# Patient Record
Sex: Male | Born: 1965 | Race: White | Hispanic: No | Smoking: Former smoker
Health system: Southern US, Community
[De-identification: ages and names within clinical notes are randomized; demographics above are authoritative.]

## PROBLEM LIST (undated history)

## (undated) DIAGNOSIS — K589 Irritable bowel syndrome without diarrhea: Secondary | ICD-10-CM

## (undated) DIAGNOSIS — I10 Essential (primary) hypertension: Secondary | ICD-10-CM

## (undated) DIAGNOSIS — E669 Obesity, unspecified: Secondary | ICD-10-CM

## (undated) DIAGNOSIS — K219 Gastro-esophageal reflux disease without esophagitis: Secondary | ICD-10-CM

## (undated) DIAGNOSIS — M1990 Unspecified osteoarthritis, unspecified site: Secondary | ICD-10-CM

## (undated) DIAGNOSIS — F329 Major depressive disorder, single episode, unspecified: Secondary | ICD-10-CM

## (undated) DIAGNOSIS — T7840XA Allergy, unspecified, initial encounter: Secondary | ICD-10-CM

## (undated) DIAGNOSIS — G9332 Myalgic encephalomyelitis/chronic fatigue syndrome: Secondary | ICD-10-CM

## (undated) DIAGNOSIS — R5382 Chronic fatigue, unspecified: Secondary | ICD-10-CM

## (undated) DIAGNOSIS — N200 Calculus of kidney: Secondary | ICD-10-CM

## (undated) DIAGNOSIS — E785 Hyperlipidemia, unspecified: Secondary | ICD-10-CM

## (undated) DIAGNOSIS — M797 Fibromyalgia: Secondary | ICD-10-CM

## (undated) DIAGNOSIS — F419 Anxiety disorder, unspecified: Secondary | ICD-10-CM

## (undated) DIAGNOSIS — F32A Depression, unspecified: Secondary | ICD-10-CM

## (undated) DIAGNOSIS — G4730 Sleep apnea, unspecified: Secondary | ICD-10-CM

## (undated) HISTORY — DX: Allergy, unspecified, initial encounter: T78.40XA

## (undated) HISTORY — DX: Myalgic encephalomyelitis/chronic fatigue syndrome: G93.32

## (undated) HISTORY — DX: Unspecified osteoarthritis, unspecified site: M19.90

## (undated) HISTORY — DX: Irritable bowel syndrome, unspecified: K58.9

## (undated) HISTORY — PX: KNEE SURGERY: SHX244

## (undated) HISTORY — DX: Chronic fatigue, unspecified: R53.82

## (undated) HISTORY — DX: Major depressive disorder, single episode, unspecified: F32.9

## (undated) HISTORY — DX: Hyperlipidemia, unspecified: E78.5

## (undated) HISTORY — DX: Fibromyalgia: M79.7

## (undated) HISTORY — DX: Sleep apnea, unspecified: G47.30

## (undated) HISTORY — DX: Essential (primary) hypertension: I10

## (undated) HISTORY — DX: Calculus of kidney: N20.0

## (undated) HISTORY — PX: SHOULDER SURGERY: SHX246

## (undated) HISTORY — DX: Anxiety disorder, unspecified: F41.9

## (undated) HISTORY — DX: Depression, unspecified: F32.A

## (undated) HISTORY — PX: MOUTH SURGERY: SHX715

## (undated) HISTORY — PX: BACK SURGERY: SHX140

## (undated) HISTORY — DX: Gastro-esophageal reflux disease without esophagitis: K21.9

## (undated) HISTORY — DX: Obesity, unspecified: E66.9

## (undated) HISTORY — PX: OTHER SURGICAL HISTORY: SHX169

## (undated) HISTORY — PX: CHOLECYSTECTOMY: SHX55

---

## 2002-08-21 ENCOUNTER — Emergency Department (HOSPITAL_COMMUNITY): Admission: EM | Admit: 2002-08-21 | Discharge: 2002-08-21 | Payer: Self-pay

## 2002-08-23 ENCOUNTER — Encounter: Admission: RE | Admit: 2002-08-23 | Discharge: 2002-08-23 | Payer: Self-pay | Admitting: *Deleted

## 2002-08-23 ENCOUNTER — Encounter: Payer: Self-pay | Admitting: Gastroenterology

## 2002-11-08 ENCOUNTER — Encounter: Payer: Self-pay | Admitting: Gastroenterology

## 2002-11-08 ENCOUNTER — Encounter: Admission: RE | Admit: 2002-11-08 | Discharge: 2002-11-08 | Payer: Self-pay | Admitting: Gastroenterology

## 2005-12-14 ENCOUNTER — Emergency Department (HOSPITAL_COMMUNITY): Admission: EM | Admit: 2005-12-14 | Discharge: 2005-12-14 | Payer: Self-pay | Admitting: Emergency Medicine

## 2005-12-30 ENCOUNTER — Encounter: Admission: RE | Admit: 2005-12-30 | Discharge: 2006-01-28 | Payer: Self-pay | Admitting: Family Medicine

## 2006-02-20 ENCOUNTER — Encounter: Admission: RE | Admit: 2006-02-20 | Discharge: 2006-02-20 | Payer: Self-pay | Admitting: Endocrinology

## 2006-04-03 ENCOUNTER — Encounter: Admission: RE | Admit: 2006-04-03 | Discharge: 2006-04-03 | Payer: Self-pay | Admitting: Orthopedic Surgery

## 2006-06-18 ENCOUNTER — Encounter (INDEPENDENT_AMBULATORY_CARE_PROVIDER_SITE_OTHER): Payer: Self-pay | Admitting: Specialist

## 2006-06-18 ENCOUNTER — Ambulatory Visit (HOSPITAL_BASED_OUTPATIENT_CLINIC_OR_DEPARTMENT_OTHER): Admission: RE | Admit: 2006-06-18 | Discharge: 2006-06-18 | Payer: Self-pay | Admitting: Orthopedic Surgery

## 2007-03-30 ENCOUNTER — Emergency Department (HOSPITAL_COMMUNITY): Admission: EM | Admit: 2007-03-30 | Discharge: 2007-03-31 | Payer: Self-pay | Admitting: Emergency Medicine

## 2009-04-19 ENCOUNTER — Emergency Department (HOSPITAL_COMMUNITY): Admission: EM | Admit: 2009-04-19 | Discharge: 2009-04-20 | Payer: Self-pay | Admitting: Emergency Medicine

## 2009-05-01 ENCOUNTER — Emergency Department (HOSPITAL_COMMUNITY): Admission: EM | Admit: 2009-05-01 | Discharge: 2009-05-01 | Payer: Self-pay | Admitting: Emergency Medicine

## 2009-05-02 ENCOUNTER — Ambulatory Visit (HOSPITAL_COMMUNITY): Admission: RE | Admit: 2009-05-02 | Discharge: 2009-05-02 | Payer: Self-pay | Admitting: Internal Medicine

## 2009-05-21 ENCOUNTER — Encounter: Payer: Self-pay | Admitting: Cardiovascular Disease

## 2009-10-07 ENCOUNTER — Emergency Department (HOSPITAL_COMMUNITY): Admission: EM | Admit: 2009-10-07 | Discharge: 2009-10-08 | Payer: Self-pay | Admitting: Emergency Medicine

## 2009-10-09 ENCOUNTER — Emergency Department (HOSPITAL_COMMUNITY): Admission: EM | Admit: 2009-10-09 | Discharge: 2009-10-09 | Payer: Self-pay | Admitting: Emergency Medicine

## 2009-10-09 ENCOUNTER — Ambulatory Visit (HOSPITAL_COMMUNITY): Admission: AD | Admit: 2009-10-09 | Discharge: 2009-10-09 | Payer: Self-pay | Admitting: Urology

## 2009-10-16 ENCOUNTER — Observation Stay (HOSPITAL_COMMUNITY): Admission: AD | Admit: 2009-10-16 | Discharge: 2009-10-17 | Payer: Self-pay | Admitting: Urology

## 2010-10-13 LAB — DIFFERENTIAL
Basophils Absolute: 0 10*3/uL (ref 0.0–0.1)
Eosinophils Absolute: 0.3 10*3/uL (ref 0.0–0.7)
Lymphs Abs: 2.1 10*3/uL (ref 0.7–4.0)
Neutrophils Relative %: 67 % (ref 43–77)

## 2010-10-13 LAB — POCT I-STAT, CHEM 8
BUN: 15 mg/dL (ref 6–23)
Chloride: 104 mEq/L (ref 96–112)
Creatinine, Ser: 1.1 mg/dL (ref 0.4–1.5)
Potassium: 3.8 mEq/L (ref 3.5–5.1)
Sodium: 139 mEq/L (ref 135–145)

## 2010-10-13 LAB — URINALYSIS, ROUTINE W REFLEX MICROSCOPIC
Bilirubin Urine: NEGATIVE
Glucose, UA: NEGATIVE mg/dL
Hgb urine dipstick: NEGATIVE
Protein, ur: NEGATIVE mg/dL
Specific Gravity, Urine: 1.023 (ref 1.005–1.030)
Urobilinogen, UA: 0.2 mg/dL (ref 0.0–1.0)

## 2010-10-13 LAB — BASIC METABOLIC PANEL
BUN: 20 mg/dL (ref 6–23)
Chloride: 102 mEq/L (ref 96–112)
Creatinine, Ser: 2.28 mg/dL — ABNORMAL HIGH (ref 0.4–1.5)
GFR calc non Af Amer: 32 mL/min — ABNORMAL LOW (ref >60)
Glucose, Bld: 110 mg/dL — ABNORMAL HIGH (ref 70–99)

## 2010-10-13 LAB — URINE CULTURE: Culture: NO GROWTH

## 2010-10-13 LAB — CBC
MCV: 86.5 fL (ref 78.0–100.0)
Platelets: 227 10*3/uL (ref 150–400)
RDW: 12.6 % (ref 11.5–15.5)
WBC: 9.4 10*3/uL (ref 4.0–10.5)

## 2010-10-25 LAB — POCT CARDIAC MARKERS
CKMB, poc: 1.9 ng/mL (ref 1.0–8.0)
Myoglobin, poc: 156 ng/mL (ref 12–200)

## 2010-10-25 LAB — CBC
MCV: 87.2 fL (ref 78.0–100.0)
Platelets: 332 10*3/uL (ref 150–400)
RBC: 5.15 MIL/uL (ref 4.22–5.81)
WBC: 9.9 10*3/uL (ref 4.0–10.5)

## 2010-10-25 LAB — DIFFERENTIAL
Lymphocytes Relative: 19 % (ref 12–46)
Lymphs Abs: 1.8 10*3/uL (ref 0.7–4.0)
Monocytes Relative: 5 % (ref 3–12)
Neutro Abs: 7.5 10*3/uL (ref 1.7–7.7)
Neutrophils Relative %: 76 % (ref 43–77)

## 2010-10-25 LAB — BRAIN NATRIURETIC PEPTIDE: Pro B Natriuretic peptide (BNP): <30 pg/mL (ref 0.0–100.0)

## 2010-10-25 LAB — APTT: aPTT: 25 seconds (ref 24–37)

## 2010-10-25 LAB — BASIC METABOLIC PANEL
BUN: 10 mg/dL (ref 6–23)
Calcium: 9.1 mg/dL (ref 8.4–10.5)
Chloride: 104 mEq/L (ref 96–112)
Creatinine, Ser: 1.07 mg/dL (ref 0.4–1.5)
GFR calc Af Amer: >60 mL/min (ref >60)
GFR calc non Af Amer: >60 mL/min (ref >60)

## 2010-10-25 LAB — PROTIME-INR
INR: 1 (ref 0.00–1.49)
Prothrombin Time: 12.9 seconds (ref 11.6–15.2)

## 2010-11-06 IMAGING — CT CT ANGIO CHEST
2 of 6 series · 19 of 36 positions shown · IV contrast (APPLIED)
Comparison: Chest radiograph from the same day.

CLINICAL DATA: 43-year-old male with shortness of breath, cough,
chest tightness.

CT ANGIOGRAPHY CHEST WITH CONTRAST
TECHNIQUE: Multidetector CT imaging of the chest was performed
using the standard protocol during bolus administration of
intravenous contrast. Multiplanar CT image reconstructions
including MIPs were obtained to evaluate the vascular anatomy.
Contrast: 100 ml [id number].

[Series 8: pulm embolism 1.0 b25f thins · axial · 0.71mm/px · z∈[+1284,+1484]mm · 17 of 224 slices shown]
[im 12/224  lung]
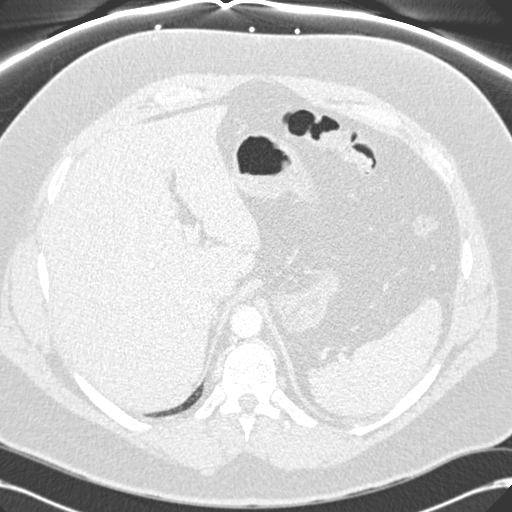
[im 23/224  mediastinal]
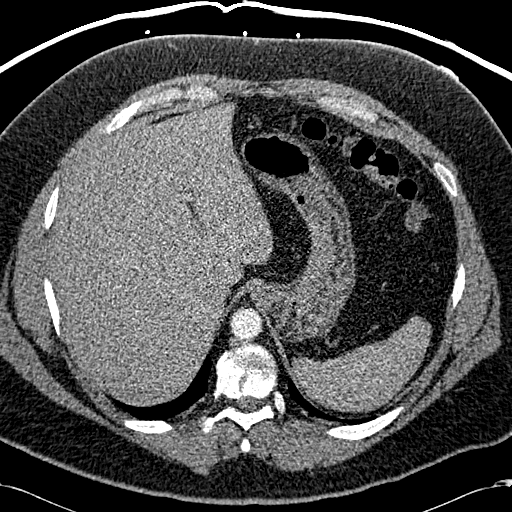
[im 34/224  lung]
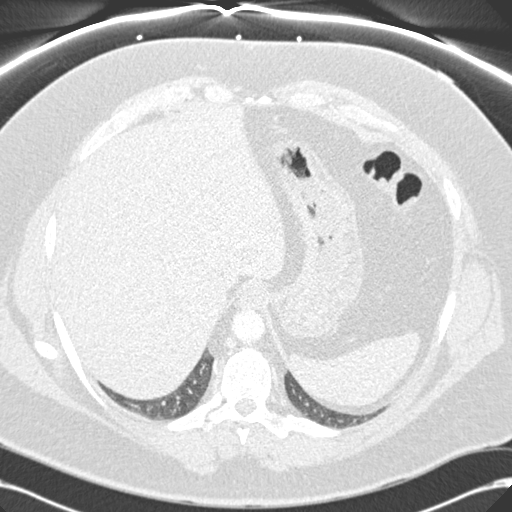
[im 45/224  mediastinal]
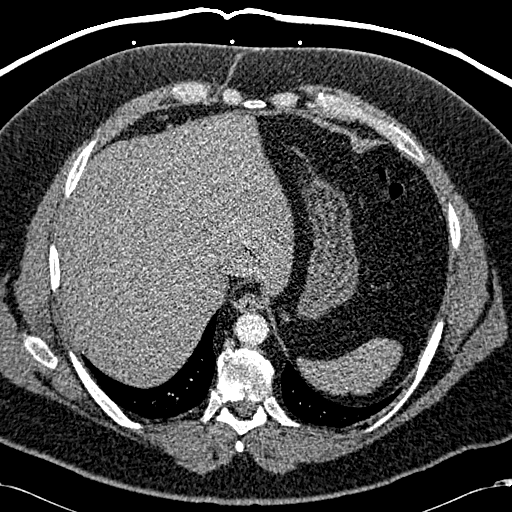
[im 67/224  lung]
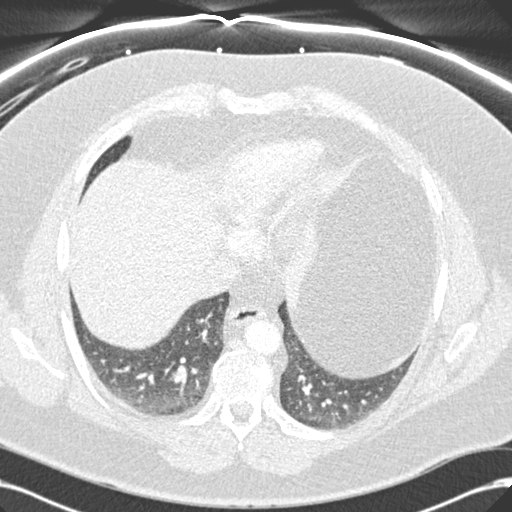
[im 79/224  mediastinal]
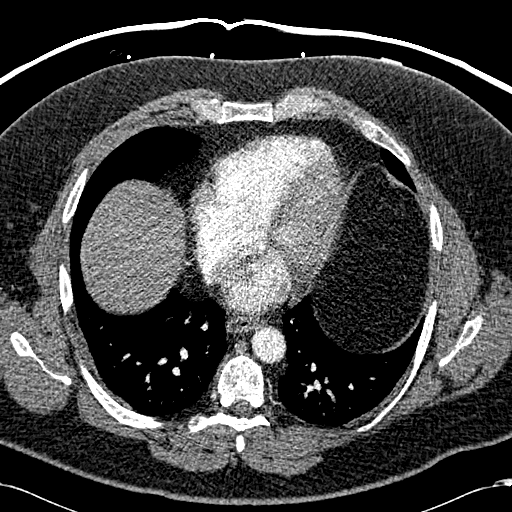
[im 90/224  lung]
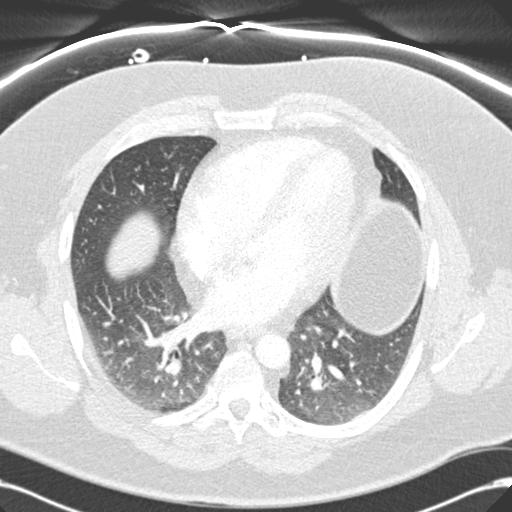
[im 101/224  mediastinal]
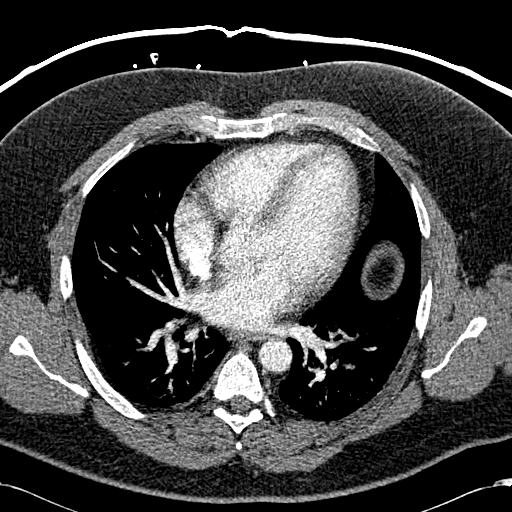
[im 112/224  lung]
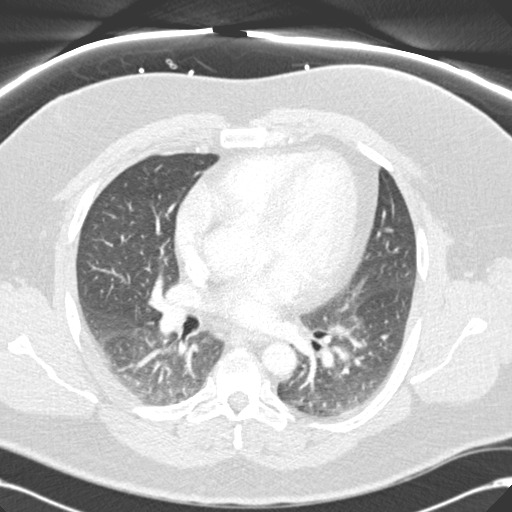
[im 123/224  mediastinal]
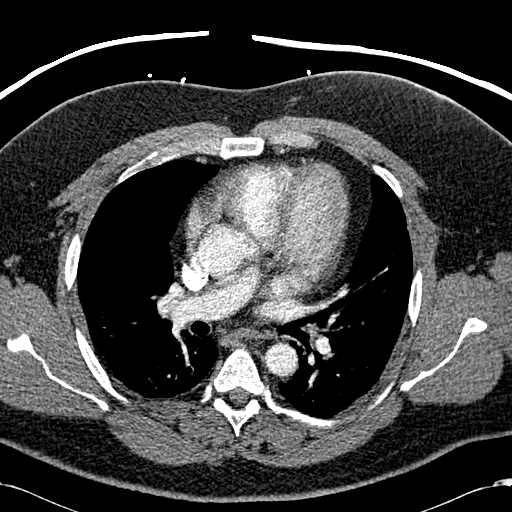
[im 134/224  lung]
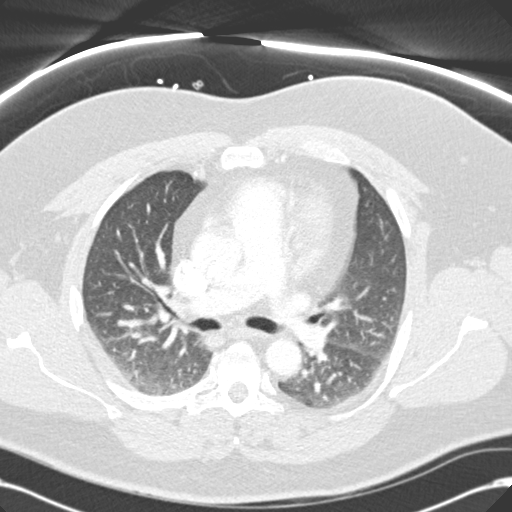
[im 145/224  mediastinal]
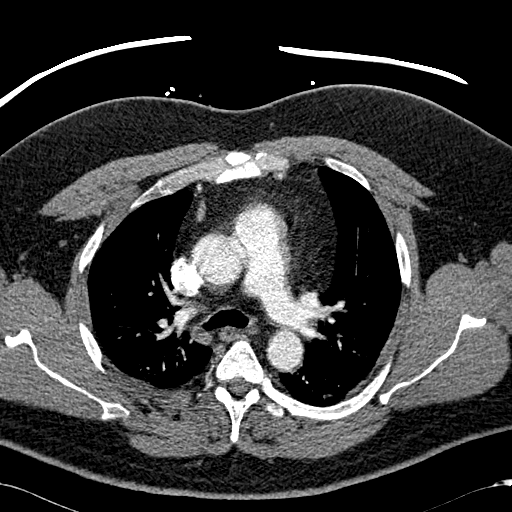
[im 157/224  lung]
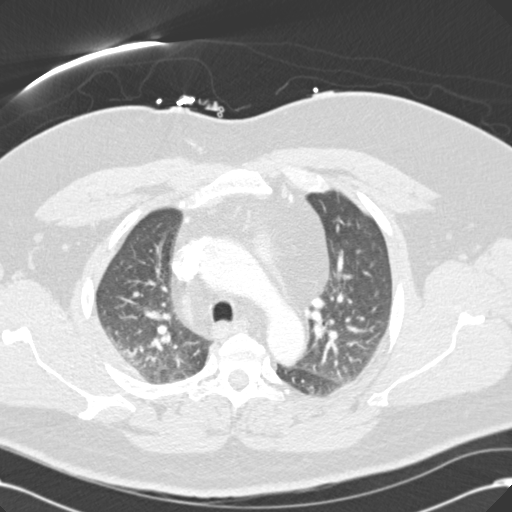
[im 179/224  mediastinal]
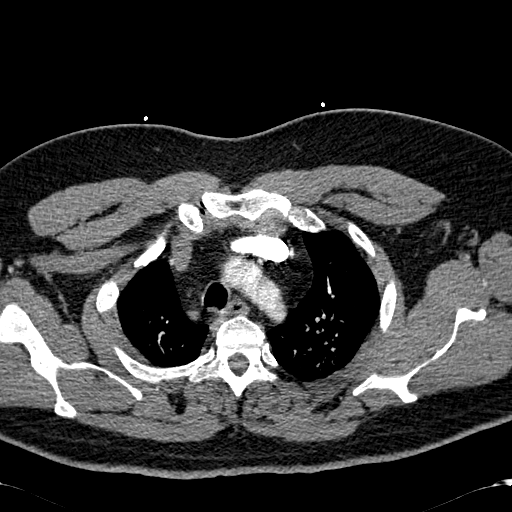
[im 190/224  lung]
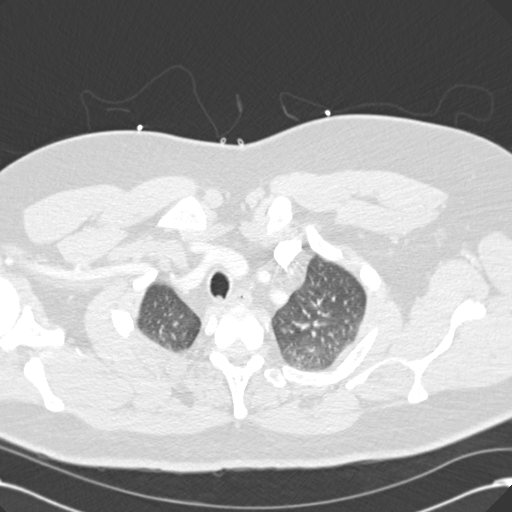
[im 201/224  mediastinal]
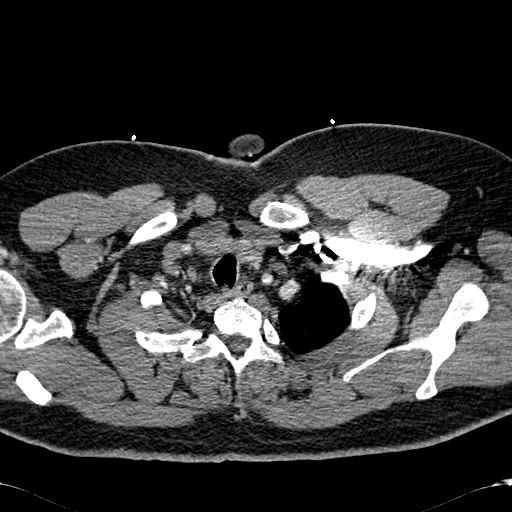
[im 212/224  lung]
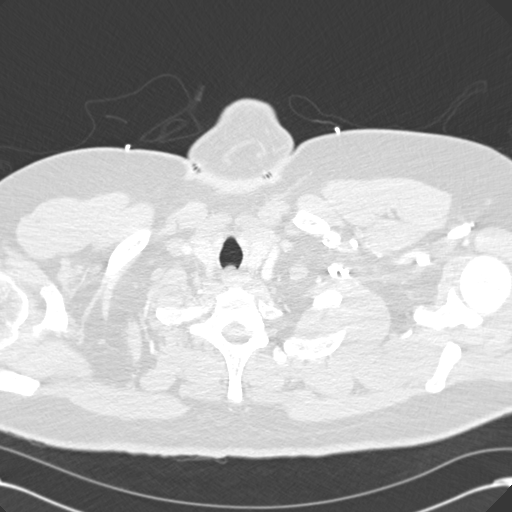

[Series 604: coronal mips · coronal · 0.71mm/px · 2 of 127 slices shown]
[im 43/127  mediastinal]
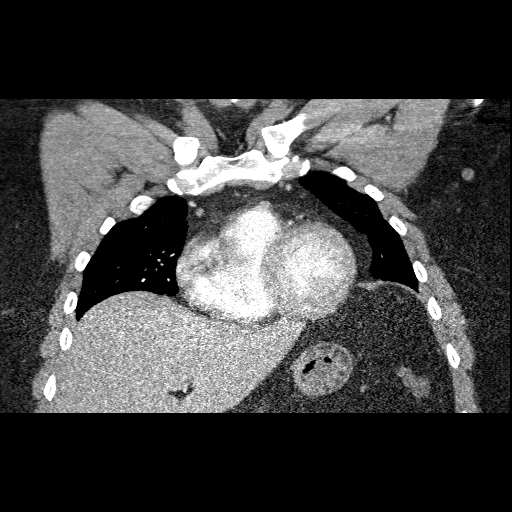
[im 85/127  mediastinal]
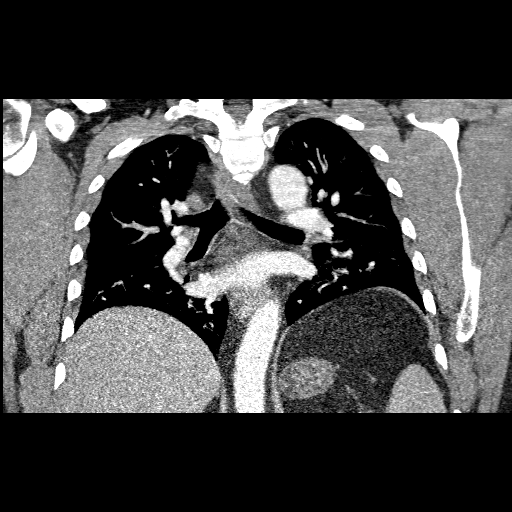

[19 of 36 positions shown; findings below may reference images not displayed]

FINDINGS: Suboptimal contrast bolus timing in the pulmonary
arterial tree.  Superimposed intermittent mild respiratory motion
artifact.  No central pulmonary embolus.  No lobar or segmental
filling defects in the pulmonary arteries.  Subsegmental branches,
particularly at the lung bases, are not well evaluated.

Dependent ground-glass opacity, likely atelectasis bilaterally.  No
airspace consolidation or confluent airspace disease.  Major airway
is are patent.

Mediastinal lipomatosis.  No pericardial or pleural effusion.  No
lymphadenopathy.  Visualized aorta unremarkable.  Visualized
thoracic inlet and upper abdominal viscera are within normal
limits.

No acute osseous abnormality identified.

Review of the MIP images confirms the above findings.
IMPRESSION: 1.  Suboptimal contrast bolus timing, and suboptimal evaluation of
subsegmental branches.  Otherwise, no evidence of acute pulmonary
embolus.
2.  Low lung volumes with atelectasis.  No other acute findings
identified in the chest.

## 2010-12-06 NOTE — Op Note (Signed)
Timothy Solis, Timothy Solis               ACCOUNT NO.:  [id number]   MEDICAL RECORD NO.:  [id number]          PATIENT TYPE:  AMB   LOCATION:  [facility]                         FACILITY:  [facility]   PHYSICIAN:  Deidre Ala, M.D.    DATE OF BIRTH:  02-Dec-1965   DATE OF PROCEDURE:  06/18/2006  DATE OF DISCHARGE:                               OPERATIVE REPORT   PREOPERATIVE DIAGNOSIS:  Left hip greater trochanteric bursitis with  tight iliotibial band and prominent greater trochanter.   POSTOPERATIVE DIAGNOSIS:  Left hip greater trochanteric bursitis with  tight iliotibial band and prominent greater trochanter.   PROCEDURE:  1. Left hip partial greater trochanteric ostectomy.  2. Iliotibial band release.  3. Greater trochanteric bursectomy left hip.   SURGEON:  1. Charlesetta Shanks, M.D.   ASSISTANT:  Clarene Reamer, [facility].   ANESTHESIA:  General endotracheal.   CULTURES:  None.   DRAINS:  None.   BLOOD LOSS:  Less than 100 mL.   REPLACEMENT:  Without.   PATHOLOGIC FINDINGS AND HISTORY:  Timothy Solis has been followed by Dr.  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  He is not felt,  at this point, he has a good candidate for surgical intervention but he  has had persistent hip area pain exquisitely tender.  He was injected  with cortisone and Marcaine visit to Dr. Alveda Reasons and came back with severe  pain.  Dr. Alveda Reasons referred him to me for further consultation.  This pain  had been going on for 2 months, getting worse.  The last shot helped for  about 5 days then recurred.  The pain from the shot was excruciating.  He is miserable.  He was ready to have something done.  His x-ray showed  some prominence of the greater trochanter but otherwise no other issues.  He had a marked left antalgic gait, no groin pain, markedly tender over  the left hip trochanteric bursa.  At surgery, he had a very, very tight  iliotibial band with a thickened trochanteric bursa sac measuring about  3 x 3 cm.   There was a very, very sharp number of osteophytes and  spurring underneath the bursa along the edge of the trochanter that we  smoothed off as well as removing the bursal sac and doing the iliotibial  band release.   PROCEDURE:  With adequate anesthesia obtained using endotracheal  technique, 1 gram Ancef given IV prophylaxis, the patient was placed in  the right lateral decubitus position with the left side up with the  beanbag with appropriate padding.  A standard prepping and draping was  carried out over the left hip.  Incision was then made directly over the  trochanter approximately 3-1/2 to 4 inches in length.  Incision deepened  sharply with a knife and hemostasis obtained using the Bovie  electrocoagulator.  Dissection was carried down to the iliotibial band  and into the tensor fascia lata and a longitudinal incision was made in  the iliotibial band directly over the trochanter with a anterior  posterior incision to further relax it about 1  cm right over the point  of the greater trochanter.  I then sharply dissected out and off the  periosteum of the bursal sac and sent it for specimen.  I then used an  osteotome and rongeur to smooth the undersurface of the bone doing a  partial greater trochanteric ostectomy.  A light film of bone wax was  placed for hemostasis.  Thorough irrigation was carried out with triple  antibiotic solution.  The wound was then closed in layers on the deep  subcu with 0 and then 2-0 and 3-0 Vicryl and skin staples.  Hemostasis  was further obtained.  Bulky sterile compressive dressing was applied  and the patient, having tolerated the procedure well, was awakened,  taken to the recovery room in satisfactory condition, to be discharged  per outpatient routine, crutches, weightbearing as tolerated, ice packs  to the area with Percocet for pain.  Told to call the office for  appointment for recheck on Monday.            ______________________________  V. Charlesetta Shanks, M.D.     VEP/MEDQ  D:  06/18/2006  T:  06/19/2006  Job:  [id number]   cc:   Otho Darner, M.D.  Fax: [phone]   Nelda Severe, MD  Fax: [phone]   Anna Genre. Little, M.D.  Fax: [phone]

## 2011-03-20 ENCOUNTER — Inpatient Hospital Stay: Admission: RE | Admit: 2011-03-20 | Payer: Self-pay | Source: Ambulatory Visit

## 2011-03-20 ENCOUNTER — Other Ambulatory Visit: Payer: Self-pay | Admitting: Family Medicine

## 2011-03-21 ENCOUNTER — Ambulatory Visit
Admission: RE | Admit: 2011-03-21 | Discharge: 2011-03-21 | Disposition: A | Discharge status: Home / Self Care | Payer: PRIVATE HEALTH INSURANCE | Source: Ambulatory Visit | Attending: Family Medicine | Admitting: Family Medicine

## 2011-03-21 ENCOUNTER — Other Ambulatory Visit: Payer: Self-pay

## 2011-03-27 ENCOUNTER — Encounter: Payer: Self-pay | Admitting: Internal Medicine

## 2011-04-15 ENCOUNTER — Ambulatory Visit: Payer: PRIVATE HEALTH INSURANCE | Admitting: Internal Medicine

## 2011-05-02 LAB — URINALYSIS, ROUTINE W REFLEX MICROSCOPIC
Glucose, UA: NEGATIVE
Ketones, ur: NEGATIVE
Leukocytes, UA: NEGATIVE
Nitrite: NEGATIVE
Protein, ur: NEGATIVE
Urobilinogen, UA: 0.2

## 2011-05-02 LAB — CBC
HCT: 45.2
Hemoglobin: 15.4
MCHC: 34.1
MCV: 86.6
RBC: 5.23
RDW: 13

## 2011-05-02 LAB — I-STAT 8, (EC8 V) (CONVERTED LAB)
Acid-Base Excess: 1
BUN: 13
Bicarbonate: 23.6
Chloride: 104
HCT: 48
Hemoglobin: 16.3
Operator id: 272551
Sodium: 137

## 2011-05-02 LAB — DIFFERENTIAL
Basophils Absolute: 0
Lymphocytes Relative: 21
Monocytes Absolute: 0.5
Monocytes Relative: 6
Neutro Abs: 6.4

## 2011-05-02 LAB — URINE MICROSCOPIC-ADD ON

## 2011-07-02 ENCOUNTER — Ambulatory Visit (INDEPENDENT_AMBULATORY_CARE_PROVIDER_SITE_OTHER): Payer: PRIVATE HEALTH INSURANCE

## 2011-07-02 DIAGNOSIS — J209 Acute bronchitis, unspecified: Secondary | ICD-10-CM

## 2011-07-23 ENCOUNTER — Ambulatory Visit (INDEPENDENT_AMBULATORY_CARE_PROVIDER_SITE_OTHER): Payer: PRIVATE HEALTH INSURANCE

## 2011-07-23 DIAGNOSIS — J40 Bronchitis, not specified as acute or chronic: Secondary | ICD-10-CM

## 2011-07-23 DIAGNOSIS — J069 Acute upper respiratory infection, unspecified: Secondary | ICD-10-CM

## 2011-07-23 DIAGNOSIS — J040 Acute laryngitis: Secondary | ICD-10-CM

## 2011-08-05 ENCOUNTER — Ambulatory Visit (INDEPENDENT_AMBULATORY_CARE_PROVIDER_SITE_OTHER): Payer: PRIVATE HEALTH INSURANCE

## 2011-08-05 DIAGNOSIS — J029 Acute pharyngitis, unspecified: Secondary | ICD-10-CM

## 2011-09-09 ENCOUNTER — Ambulatory Visit: Payer: PRIVATE HEALTH INSURANCE

## 2011-09-10 ENCOUNTER — Ambulatory Visit (INDEPENDENT_AMBULATORY_CARE_PROVIDER_SITE_OTHER): Payer: PRIVATE HEALTH INSURANCE | Admitting: Family Medicine

## 2011-09-10 VITALS — BP 134/80 | HR 96 | Temp 98.6°F | Resp 22 | Ht 70.5 in | Wt 327.4 lb

## 2011-09-10 DIAGNOSIS — E669 Obesity, unspecified: Secondary | ICD-10-CM

## 2011-09-10 DIAGNOSIS — R110 Nausea: Secondary | ICD-10-CM

## 2011-09-10 DIAGNOSIS — R51 Headache: Secondary | ICD-10-CM

## 2011-09-10 DIAGNOSIS — B9789 Other viral agents as the cause of diseases classified elsewhere: Secondary | ICD-10-CM

## 2011-09-10 DIAGNOSIS — I10 Essential (primary) hypertension: Secondary | ICD-10-CM

## 2011-09-10 DIAGNOSIS — J310 Chronic rhinitis: Secondary | ICD-10-CM

## 2011-09-10 DIAGNOSIS — B349 Viral infection, unspecified: Secondary | ICD-10-CM

## 2011-09-10 DIAGNOSIS — R5381 Other malaise: Secondary | ICD-10-CM

## 2011-09-10 LAB — COMPREHENSIVE METABOLIC PANEL
ALT: 30 U/L (ref 0–53)
AST: 28 U/L (ref 0–37)
Albumin: 4.4 g/dL (ref 3.5–5.2)
CO2: 23 mEq/L (ref 19–32)
Calcium: 9.3 mg/dL (ref 8.4–10.5)
Chloride: 105 mEq/L (ref 96–112)
Creat: 0.95 mg/dL (ref 0.50–1.35)
Potassium: 4.4 mEq/L (ref 3.5–5.3)
Sodium: 139 mEq/L (ref 135–145)
Total Protein: 6.8 g/dL (ref 6.0–8.3)

## 2011-09-10 LAB — POCT CBC
HCT, POC: 46.3 % (ref 43.5–53.7)
Hemoglobin: 14.9 g/dL (ref 14.1–18.1)
Lymph, poc: 2.3 (ref 0.6–3.4)
MCHC: 32.2 g/dL (ref 31.8–35.4)
POC Granulocyte: 4.5 (ref 2–6.9)
RBC: 5.32 M/uL (ref 4.69–6.13)

## 2011-09-10 LAB — POCT GLYCOSYLATED HEMOGLOBIN (HGB A1C): Hemoglobin A1C: 5.5

## 2011-09-10 LAB — GLUCOSE, POCT (MANUAL RESULT ENTRY): POC Glucose: 93

## 2011-09-10 MED ORDER — FLUTICASONE PROPIONATE 50 MCG/ACT NA SUSP: 2.0000 | Freq: Every day | NASAL | Status: DC

## 2011-09-10 NOTE — Patient Instructions (Signed)
Encourage plenty of fluids and get enough rest. If not any better get back to [place].

## 2011-09-10 NOTE — Progress Notes (Signed)
  Subjective:    Patient ID: Timothy Solis, male    DOB: 12-28-1965, 46 y.o.   MRN: [id number]  HPI He has been feeling bad since about Friday. He has had a headache, which may be doing a little bit better. However it does make it hard for him to focus on the computer screen and 3-D males and things. He also has nausea which has come and gone in waves. He had a lot of nausea this morning earlier but it has subsided some now. He just doesn't feel good. A lot is just a nonspecific malaise. No acute infection associated with it. He does have chronic sinus congestion from his allergies.  I had sent the patient to the ENT. It was a muscle in the neck hurting him. He switched him to prilosec and Allegra and he is doing better.    Review of Systems otherwise fairly unremarkable. No major cardiovascular GI or respiratory complaints other than the nausea.     Objective:   Physical Exam Obese white male no acute distress but just has the appearance of someone who does not feel good. His TMs are normal. A little bit of cerumen bilaterally. Eyes PERRLA fundi benign. Throat clear. Neck supple without significant nodes. No carotid bruits. Chest clear to auscultation. Heart regular without any murmurs gallops arrhythmias. Abdomen soft.  Results for orders placed in visit on 09/10/11  POCT CBC      Component Value Range   WBC 7.5  4.6 - 10.2 (K/uL)   Lymph, poc 2.3  0.6 - 3.4    POC LYMPH PERCENT 30.2  10 - 50 (%L)   MID (cbc) 0.7  0 - 0.9    POC MID % 9.9  0 - 12 (%M)   POC Granulocyte 4.5  2 - 6.9    Granulocyte percent 59.9  37 - 80 (%G)   RBC 5.32  4.69 - 6.13 (M/uL)   Hemoglobin 14.9  14.1 - 18.1 (g/dL)   HCT, POC 78.2  95.6 - 53.7 (%)   MCV 87.0  80 - 97 (fL)   MCH, POC 28.0  27 - 31.2 (pg)   MCHC 32.2  31.8 - 35.4 (g/dL)   RDW, POC 21.3     Platelet Count, POC 290  142 - 424 (K/uL)   MPV 7.6  0 - 99.8 (fL)  POCT GLYCOSYLATED HEMOGLOBIN (HGB A1C)      Component Value Range   Hemoglobin  A1C 5.5    GLUCOSE, POCT (MANUAL RESULT ENTRY)      Component Value Range   POC Glucose 93          Assessment & Plan:  Generalized malaise with headache and nausea.  We'll check labs and talks more.

## 2011-12-13 ENCOUNTER — Other Ambulatory Visit: Payer: Self-pay | Admitting: Family Medicine

## 2011-12-23 ENCOUNTER — Ambulatory Visit (INDEPENDENT_AMBULATORY_CARE_PROVIDER_SITE_OTHER): Payer: PRIVATE HEALTH INSURANCE | Admitting: Family Medicine

## 2011-12-23 VITALS — BP 130/84 | HR 98 | Temp 98.6°F | Resp 18 | Ht 70.5 in | Wt 319.0 lb

## 2011-12-23 DIAGNOSIS — F39 Unspecified mood [affective] disorder: Secondary | ICD-10-CM

## 2011-12-23 DIAGNOSIS — F411 Generalized anxiety disorder: Secondary | ICD-10-CM

## 2011-12-23 MED ORDER — LORAZEPAM 1 MG PO TABS: 1.0000 mg | ORAL_TABLET | Freq: Every day | ORAL | Status: DC

## 2011-12-23 NOTE — Progress Notes (Signed)
  Subjective:    Patient ID: Timothy Solis, male    DOB: 1966-02-25, 46 y.o.   MRN: [id number]  HPI  Patient presents to clinic detailing a several month history of depression.  Diagnosed with depression in his 20's; attempted suicide X 3 in the past(age 62, 21 and 25) History of alcoholism however once depression treated he stopped drinking.  Never entered treatment facility.  Fatigue, malaise, social isolation and anhedonia.  Denies active or passive suicidality.  FH/ Mood disorder  Stressors- "second mom" passed away in 09/12/2022                    Divorced;   Strong support system to include close friends and very supportive boss. Review of Systems     Objective:   Physical Exam  Constitutional: He appears well-developed.  Neck: Neck supple. No thyromegaly present.  Cardiovascular: Normal rate, regular rhythm and normal heart sounds.   Pulmonary/Chest: Effort normal and breath sounds normal.  Skin: Skin is warm.  Psychiatric:       Flat however interactive and insightful during interview        Assessment & Plan:   1. Mood disorder  LORazepam (ATIVAN) 1 MG tablet    Supportive counseling Patient to follow up with Dr. Dub Solis or Timothy Solis.  At close of OV patient again denies active or passive suicidality. Extended OV

## 2011-12-23 NOTE — Patient Instructions (Signed)
Please schedule appointment with   1) Dr. Dub Mikes   or  2) Dr. Tomasa Rand

## 2011-12-24 ENCOUNTER — Telehealth: Payer: Self-pay

## 2011-12-24 NOTE — Telephone Encounter (Addendum)
Pt states taking the LORazepam (ATIVAN) 1 MG tablet Is making him agitated and anxious Would like to discuss this with his doctor.  Called back has not heard from anyone.  Please call ASAP.

## 2011-12-24 NOTE — Telephone Encounter (Signed)
Patient developed panic symptoms earlier today that lasted 10 -15 minutes.  Symptoms have since resolved and patient feels back to baseline.    Patient has been on Ativan in the past and started back last night without difficulty. Given the discomfort of panic symptoms I encouraged patient to take .5 mg of Ativan in the morning and 1 mg of Ativan in the evening.  Patient has an appointment with Dr. Dub Mikes Tuesday 6/11.   Patient to call back with any problems prior to appointment with psychiatry.

## 2012-03-10 ENCOUNTER — Ambulatory Visit (INDEPENDENT_AMBULATORY_CARE_PROVIDER_SITE_OTHER): Payer: PRIVATE HEALTH INSURANCE | Admitting: Family Medicine

## 2012-03-10 ENCOUNTER — Ambulatory Visit: Payer: PRIVATE HEALTH INSURANCE

## 2012-03-10 VITALS — BP 121/81 | HR 96 | Temp 99.2°F | Resp 18 | Ht 70.5 in | Wt 312.0 lb

## 2012-03-10 DIAGNOSIS — J45909 Unspecified asthma, uncomplicated: Secondary | ICD-10-CM

## 2012-03-10 DIAGNOSIS — J45901 Unspecified asthma with (acute) exacerbation: Secondary | ICD-10-CM | POA: Insufficient documentation

## 2012-03-10 MED ORDER — PREDNISONE 50 MG PO TABS: ORAL_TABLET | ORAL | Status: AC

## 2012-03-10 MED ORDER — MONTELUKAST SODIUM 10 MG PO TABS: 10.0000 mg | ORAL_TABLET | Freq: Every day | ORAL | Status: DC

## 2012-03-10 MED ORDER — IPRATROPIUM BROMIDE 0.02 % IN SOLN
0.5000 mg | Freq: Once | RESPIRATORY_TRACT | Status: AC
  Administered 2012-03-10: 0.5 mg via RESPIRATORY_TRACT

## 2012-03-10 MED ORDER — ALBUTEROL SULFATE (2.5 MG/3ML) 0.083% IN NEBU
2.5000 mg | INHALATION_SOLUTION | Freq: Once | RESPIRATORY_TRACT | Status: AC
  Administered 2012-03-10: 2.5 mg via RESPIRATORY_TRACT

## 2012-03-10 MED ORDER — BECLOMETHASONE DIPROPIONATE 80 MCG/ACT IN AERS: 1.0000 | INHALATION_SPRAY | Freq: Two times a day (BID) | RESPIRATORY_TRACT | Status: DC

## 2012-03-10 NOTE — Assessment & Plan Note (Signed)
Patient does have what appears to be asthma exacerbation. Patient has been treated twice for pneumonia, and on radiological imaging we do not see one today. I will start with prednisone. Patient did have a past medical history significant for an allergic reaction last week to get which include muscle aches and pains. I do not feel that this is a true allergy as patient is taking in the past without any problems. Patient is going to 50 mg daily for the next 5 days. In addition this patient is given Qvar that he couldn't take at 80 mcg 2 times daily. Patient also start Singulair and take this daily at bedtime. Looking back to patient's previous history patient has had significant exacerbation this time year and I do think allergies are contributing to his problem. Patient will followup in one week time for reevaluation. Patient states he did not need a note for work today.

## 2012-03-10 NOTE — Progress Notes (Signed)
  Subjective:    Patient ID: Timothy Solis, male    DOB: 04-15-1966, 46 y.o.   MRN: [id number]  HPI 46 year old male who has had about one month history of shortness of breath, cough as well as fevers chills and fatigue. Patient has been seen multiple times over the course of last month. Patient has been treated 2 times with antibiotics including clindamycin as well as Levaquin. Patient states that he said he was treated he seemed to improve somewhat but then started having the same shortness of breath and chest discomfort. Patient has also been given different inhalers, which include albuterol, Qvar but we'll use this for one week, as well as ipratropium. Patient states that he has noticed minimal improvement with this either. Patient now has a nebulizer at home for the last 10 days as well as she's been using which is made minimal improvement. Patient still states that he is having some fevers as well as chills. Patient attempted to try to work every day and he was unable to do so due to fatigue as well as shortness of breath. Patient denies any lower extremity swelling. Patient does state that he does have a cough which has been somewhat productive but clear in nature.   Review of Systems As stated in history of present illness    Objective:   Physical Exam Filed Vitals:   03/10/12 1641  BP: 121/81  Pulse: 96  Temp: 99.2 F (37.3 C)  Resp: 18   pulse ox is 96% before albuterol treatment General: Patient does appear short of breath at baseline and does appear fatigued but otherwise no distress. Cardiovascular: Regular in rhythm no murmur Respiratory: Patient does have scattered wheezes heard throughout with no focal findings. Patient does not have any crackles rales or rhonchi heard patient does seem tight though on exam. Abdomen: Bowel sounds positive nontender nondistended Extremities: Patient has good capillary refill and neurovascularly intact distally. Patient has no swelling of the  distal extremities.  X-rays were ordered and reviewed by me today. Patient did have a two-view of the chest x-ray done today which did show that patient had what appears to be asthma exacerbation. Patient does have flattening of the diaphragm patient has no signs of pneumonia patient's cardiac silhouette is normal.      Assessment & Plan:

## 2012-03-10 NOTE — Progress Notes (Signed)
Patient discussed with Dr. Smith. Agree with assessment and plan of care per his note.   

## 2012-03-10 NOTE — Patient Instructions (Signed)
Very nice to meet you. I am treating this as a very bad asthma exacerbation. I'm giving you a new medicine called Qvar. You have had this in the past but this is at a higher dose. Take one puff inhaled twice daily. I am also going to give you a new medicine called Singulair. Take one pill daily at bedtime. This will help with allergies as well as her asthma. I'm also giving you prednisone 50 mg daily for the next 5 days. He should probably come back in one week's time for further evaluation.

## 2012-03-24 ENCOUNTER — Other Ambulatory Visit: Payer: Self-pay | Admitting: Family Medicine

## 2012-03-24 MED ORDER — LISINOPRIL 20 MG PO TABS: 20.0000 mg | ORAL_TABLET | Freq: Every day | ORAL | Status: DC

## 2012-06-20 ENCOUNTER — Other Ambulatory Visit: Payer: Self-pay | Admitting: Physician Assistant

## 2012-08-20 DIAGNOSIS — K219 Gastro-esophageal reflux disease without esophagitis: Secondary | ICD-10-CM

## 2012-08-20 DIAGNOSIS — K589 Irritable bowel syndrome without diarrhea: Secondary | ICD-10-CM

## 2012-08-20 DIAGNOSIS — I10 Essential (primary) hypertension: Secondary | ICD-10-CM

## 2012-08-20 HISTORY — DX: Essential (primary) hypertension: I10

## 2012-08-20 HISTORY — DX: Irritable bowel syndrome, unspecified: K58.9

## 2012-08-20 HISTORY — DX: Gastro-esophageal reflux disease without esophagitis: K21.9

## 2012-08-20 HISTORY — DX: Morbid (severe) obesity due to excess calories: E66.01

## 2012-10-05 ENCOUNTER — Other Ambulatory Visit: Payer: Self-pay | Admitting: Family Medicine

## 2012-10-05 ENCOUNTER — Telehealth: Payer: Self-pay | Admitting: Family Medicine

## 2012-10-05 NOTE — Telephone Encounter (Signed)
Called him- received a request from something called "[employer]" requesting that we upload a copy of his chart to some sort of server.  However, might make more sense to sign up for mychart so he can view his chart now and as it continues to change in the future.  He would like to do this, so will send him information about singing up for [employer]

## 2012-10-05 NOTE — Progress Notes (Signed)
Created encounter so we can print an AVS and allow him to set up mychart.  He would like to have access to his medical records online. Nothing else done today

## 2013-04-01 ENCOUNTER — Encounter: Payer: Self-pay | Admitting: Radiology

## 2013-04-01 DIAGNOSIS — J309 Allergic rhinitis, unspecified: Secondary | ICD-10-CM

## 2013-04-01 DIAGNOSIS — J45909 Unspecified asthma, uncomplicated: Secondary | ICD-10-CM

## 2013-04-01 HISTORY — DX: Unspecified asthma, uncomplicated: J45.909

## 2013-04-01 HISTORY — DX: Allergic rhinitis, unspecified: J30.9

## 2013-05-24 ENCOUNTER — Encounter: Payer: Self-pay | Admitting: Cardiovascular Disease

## 2013-07-18 DIAGNOSIS — Z0271 Encounter for disability determination: Secondary | ICD-10-CM

## 2014-08-03 DIAGNOSIS — R5382 Chronic fatigue, unspecified: Secondary | ICD-10-CM | POA: Diagnosis not present

## 2014-08-03 DIAGNOSIS — K589 Irritable bowel syndrome without diarrhea: Secondary | ICD-10-CM | POA: Diagnosis not present

## 2014-08-03 DIAGNOSIS — F331 Major depressive disorder, recurrent, moderate: Secondary | ICD-10-CM | POA: Diagnosis not present

## 2015-01-08 DIAGNOSIS — I10 Essential (primary) hypertension: Secondary | ICD-10-CM | POA: Diagnosis not present

## 2015-01-08 DIAGNOSIS — K589 Irritable bowel syndrome without diarrhea: Secondary | ICD-10-CM | POA: Diagnosis not present

## 2015-01-08 DIAGNOSIS — M5416 Radiculopathy, lumbar region: Secondary | ICD-10-CM | POA: Diagnosis not present

## 2015-01-31 DIAGNOSIS — M4806 Spinal stenosis, lumbar region: Secondary | ICD-10-CM | POA: Diagnosis not present

## 2015-01-31 DIAGNOSIS — M5416 Radiculopathy, lumbar region: Secondary | ICD-10-CM | POA: Diagnosis not present

## 2015-04-16 DIAGNOSIS — I10 Essential (primary) hypertension: Secondary | ICD-10-CM | POA: Diagnosis not present

## 2015-04-16 DIAGNOSIS — Z6841 Body Mass Index (BMI) 40.0 and over, adult: Secondary | ICD-10-CM | POA: Diagnosis not present

## 2015-04-16 DIAGNOSIS — F339 Major depressive disorder, recurrent, unspecified: Secondary | ICD-10-CM | POA: Diagnosis not present

## 2015-04-16 DIAGNOSIS — R5382 Chronic fatigue, unspecified: Secondary | ICD-10-CM | POA: Diagnosis not present

## 2015-04-16 DIAGNOSIS — M5416 Radiculopathy, lumbar region: Secondary | ICD-10-CM | POA: Diagnosis not present

## 2015-04-16 DIAGNOSIS — Z23 Encounter for immunization: Secondary | ICD-10-CM | POA: Diagnosis not present

## 2015-04-16 DIAGNOSIS — R0609 Other forms of dyspnea: Secondary | ICD-10-CM | POA: Diagnosis not present

## 2015-06-12 DIAGNOSIS — J040 Acute laryngitis: Secondary | ICD-10-CM | POA: Diagnosis not present

## 2015-06-15 DIAGNOSIS — J449 Chronic obstructive pulmonary disease, unspecified: Secondary | ICD-10-CM | POA: Diagnosis not present

## 2015-06-15 DIAGNOSIS — G8929 Other chronic pain: Secondary | ICD-10-CM | POA: Diagnosis not present

## 2015-06-15 DIAGNOSIS — Z881 Allergy status to other antibiotic agents status: Secondary | ICD-10-CM | POA: Diagnosis not present

## 2015-06-15 DIAGNOSIS — Z885 Allergy status to narcotic agent status: Secondary | ICD-10-CM | POA: Diagnosis not present

## 2015-06-15 DIAGNOSIS — M545 Low back pain: Secondary | ICD-10-CM | POA: Diagnosis not present

## 2015-06-15 DIAGNOSIS — M5441 Lumbago with sciatica, right side: Secondary | ICD-10-CM | POA: Diagnosis not present

## 2015-06-15 DIAGNOSIS — M5442 Lumbago with sciatica, left side: Secondary | ICD-10-CM | POA: Diagnosis not present

## 2015-06-15 DIAGNOSIS — Z888 Allergy status to other drugs, medicaments and biological substances status: Secondary | ICD-10-CM | POA: Diagnosis not present

## 2015-06-15 DIAGNOSIS — F329 Major depressive disorder, single episode, unspecified: Secondary | ICD-10-CM | POA: Diagnosis not present

## 2015-06-15 DIAGNOSIS — I10 Essential (primary) hypertension: Secondary | ICD-10-CM | POA: Diagnosis not present

## 2015-06-20 DIAGNOSIS — M5116 Intervertebral disc disorders with radiculopathy, lumbar region: Secondary | ICD-10-CM

## 2015-06-20 DIAGNOSIS — M797 Fibromyalgia: Secondary | ICD-10-CM | POA: Insufficient documentation

## 2015-06-20 HISTORY — DX: Intervertebral disc disorders with radiculopathy, lumbar region: M51.16

## 2015-07-24 DIAGNOSIS — M5116 Intervertebral disc disorders with radiculopathy, lumbar region: Secondary | ICD-10-CM | POA: Diagnosis not present

## 2015-08-02 DIAGNOSIS — M4806 Spinal stenosis, lumbar region: Secondary | ICD-10-CM | POA: Diagnosis not present

## 2015-08-10 ENCOUNTER — Encounter: Payer: Self-pay | Admitting: Family Medicine

## 2015-08-10 DIAGNOSIS — M5442 Lumbago with sciatica, left side: Secondary | ICD-10-CM | POA: Diagnosis not present

## 2015-08-10 DIAGNOSIS — R5382 Chronic fatigue, unspecified: Secondary | ICD-10-CM | POA: Diagnosis not present

## 2015-08-10 DIAGNOSIS — M5116 Intervertebral disc disorders with radiculopathy, lumbar region: Secondary | ICD-10-CM | POA: Diagnosis not present

## 2015-08-10 DIAGNOSIS — M4806 Spinal stenosis, lumbar region: Secondary | ICD-10-CM | POA: Diagnosis not present

## 2015-08-10 DIAGNOSIS — M5441 Lumbago with sciatica, right side: Secondary | ICD-10-CM | POA: Diagnosis not present

## 2015-08-10 DIAGNOSIS — M5126 Other intervertebral disc displacement, lumbar region: Secondary | ICD-10-CM | POA: Diagnosis not present

## 2015-08-10 DIAGNOSIS — I10 Essential (primary) hypertension: Secondary | ICD-10-CM | POA: Diagnosis not present

## 2015-08-10 DIAGNOSIS — G8929 Other chronic pain: Secondary | ICD-10-CM | POA: Diagnosis not present

## 2015-08-15 ENCOUNTER — Encounter: Payer: Self-pay | Admitting: Family Medicine

## 2015-08-23 DIAGNOSIS — M5441 Lumbago with sciatica, right side: Secondary | ICD-10-CM | POA: Diagnosis not present

## 2015-08-23 DIAGNOSIS — G8929 Other chronic pain: Secondary | ICD-10-CM | POA: Diagnosis not present

## 2015-08-23 DIAGNOSIS — R262 Difficulty in walking, not elsewhere classified: Secondary | ICD-10-CM | POA: Diagnosis not present

## 2015-08-23 DIAGNOSIS — M5442 Lumbago with sciatica, left side: Secondary | ICD-10-CM | POA: Diagnosis not present

## 2015-08-29 DIAGNOSIS — M5442 Lumbago with sciatica, left side: Secondary | ICD-10-CM | POA: Diagnosis not present

## 2015-08-29 DIAGNOSIS — G8929 Other chronic pain: Secondary | ICD-10-CM | POA: Diagnosis not present

## 2015-08-29 DIAGNOSIS — M5441 Lumbago with sciatica, right side: Secondary | ICD-10-CM | POA: Diagnosis not present

## 2015-08-29 DIAGNOSIS — R262 Difficulty in walking, not elsewhere classified: Secondary | ICD-10-CM | POA: Diagnosis not present

## 2015-09-04 DIAGNOSIS — G8929 Other chronic pain: Secondary | ICD-10-CM | POA: Diagnosis not present

## 2015-09-04 DIAGNOSIS — M5442 Lumbago with sciatica, left side: Secondary | ICD-10-CM | POA: Diagnosis not present

## 2015-09-04 DIAGNOSIS — M5441 Lumbago with sciatica, right side: Secondary | ICD-10-CM | POA: Diagnosis not present

## 2015-09-04 DIAGNOSIS — R262 Difficulty in walking, not elsewhere classified: Secondary | ICD-10-CM | POA: Diagnosis not present

## 2015-09-14 DIAGNOSIS — G8929 Other chronic pain: Secondary | ICD-10-CM | POA: Diagnosis not present

## 2015-09-14 DIAGNOSIS — M5442 Lumbago with sciatica, left side: Secondary | ICD-10-CM | POA: Diagnosis not present

## 2015-09-14 DIAGNOSIS — M5441 Lumbago with sciatica, right side: Secondary | ICD-10-CM | POA: Diagnosis not present

## 2015-09-14 DIAGNOSIS — R262 Difficulty in walking, not elsewhere classified: Secondary | ICD-10-CM | POA: Diagnosis not present

## 2015-09-19 DIAGNOSIS — G8929 Other chronic pain: Secondary | ICD-10-CM | POA: Diagnosis not present

## 2015-09-19 DIAGNOSIS — M5442 Lumbago with sciatica, left side: Secondary | ICD-10-CM | POA: Diagnosis not present

## 2015-09-19 DIAGNOSIS — M5441 Lumbago with sciatica, right side: Secondary | ICD-10-CM | POA: Diagnosis not present

## 2015-09-19 DIAGNOSIS — R262 Difficulty in walking, not elsewhere classified: Secondary | ICD-10-CM | POA: Diagnosis not present

## 2015-09-21 DIAGNOSIS — R200 Anesthesia of skin: Secondary | ICD-10-CM | POA: Diagnosis not present

## 2015-09-21 DIAGNOSIS — G8929 Other chronic pain: Secondary | ICD-10-CM | POA: Diagnosis not present

## 2015-09-21 DIAGNOSIS — R5382 Chronic fatigue, unspecified: Secondary | ICD-10-CM | POA: Diagnosis not present

## 2015-09-21 DIAGNOSIS — M5416 Radiculopathy, lumbar region: Secondary | ICD-10-CM | POA: Diagnosis not present

## 2015-09-21 DIAGNOSIS — M4806 Spinal stenosis, lumbar region: Secondary | ICD-10-CM | POA: Diagnosis not present

## 2015-09-21 DIAGNOSIS — M5117 Intervertebral disc disorders with radiculopathy, lumbosacral region: Secondary | ICD-10-CM | POA: Diagnosis not present

## 2015-09-24 DIAGNOSIS — R5081 Fever presenting with conditions classified elsewhere: Secondary | ICD-10-CM | POA: Diagnosis not present

## 2015-09-24 DIAGNOSIS — I10 Essential (primary) hypertension: Secondary | ICD-10-CM | POA: Diagnosis not present

## 2015-09-24 DIAGNOSIS — J069 Acute upper respiratory infection, unspecified: Secondary | ICD-10-CM | POA: Diagnosis not present

## 2015-09-24 DIAGNOSIS — M5116 Intervertebral disc disorders with radiculopathy, lumbar region: Secondary | ICD-10-CM | POA: Diagnosis not present

## 2015-11-01 ENCOUNTER — Telehealth: Payer: Self-pay | Admitting: *Deleted

## 2015-11-01 NOTE — Telephone Encounter (Signed)
Unable to reach patient at time of pre-visit call. Left message for patient to return call when available.  

## 2015-11-05 ENCOUNTER — Ambulatory Visit (INDEPENDENT_AMBULATORY_CARE_PROVIDER_SITE_OTHER): Payer: Medicare Other | Admitting: Family Medicine

## 2015-11-05 ENCOUNTER — Encounter: Payer: Self-pay | Admitting: Family Medicine

## 2015-11-05 VITALS — BP 111/66 | HR 86 | Temp 98.9°F | Ht 70.5 in | Wt 353.8 lb

## 2015-11-05 DIAGNOSIS — Z131 Encounter for screening for diabetes mellitus: Secondary | ICD-10-CM

## 2015-11-05 DIAGNOSIS — R5382 Chronic fatigue, unspecified: Secondary | ICD-10-CM

## 2015-11-05 DIAGNOSIS — K589 Irritable bowel syndrome without diarrhea: Secondary | ICD-10-CM

## 2015-11-05 DIAGNOSIS — Z23 Encounter for immunization: Secondary | ICD-10-CM

## 2015-11-05 DIAGNOSIS — Z130 Encounter for screening for diseases of the blood and blood-forming organs and certain disorders involving the immune mechanism: Secondary | ICD-10-CM | POA: Diagnosis not present

## 2015-11-05 DIAGNOSIS — Z13220 Encounter for screening for lipoid disorders: Secondary | ICD-10-CM

## 2015-11-05 DIAGNOSIS — F325 Major depressive disorder, single episode, in full remission: Secondary | ICD-10-CM | POA: Diagnosis not present

## 2015-11-05 HISTORY — DX: Major depressive disorder, single episode, in full remission: F32.5

## 2015-11-05 HISTORY — DX: Chronic fatigue, unspecified: R53.82

## 2015-11-05 LAB — HEMOGLOBIN A1C: Hgb A1c MFr Bld: 5.9 % (ref 4.6–6.5)

## 2015-11-05 LAB — LIPID PANEL
CHOLESTEROL: 189 mg/dL (ref 0–200)
HDL: 35.2 mg/dL — AB (ref >39.00)
LDL Cholesterol: 120 mg/dL — ABNORMAL HIGH (ref 0–99)
NonHDL: 153.71
Total CHOL/HDL Ratio: 5
Triglycerides: 170 mg/dL — ABNORMAL HIGH (ref 0.0–149.0)
VLDL: 34 mg/dL (ref 0.0–40.0)

## 2015-11-05 LAB — COMPREHENSIVE METABOLIC PANEL
ALBUMIN: 4.4 g/dL (ref 3.5–5.2)
ALK PHOS: 73 U/L (ref 39–117)
ALT: 24 U/L (ref 0–53)
AST: 22 U/L (ref 0–37)
BUN: 19 mg/dL (ref 6–23)
CHLORIDE: 102 meq/L (ref 96–112)
CO2: 28 mEq/L (ref 19–32)
Calcium: 9.8 mg/dL (ref 8.4–10.5)
Creatinine, Ser: 1.14 mg/dL (ref 0.40–1.50)
GFR: 72.95 mL/min (ref >60.00)
Glucose, Bld: 105 mg/dL — ABNORMAL HIGH (ref 70–99)
POTASSIUM: 4.8 meq/L (ref 3.5–5.1)
Sodium: 137 mEq/L (ref 135–145)
TOTAL PROTEIN: 7.7 g/dL (ref 6.0–8.3)
Total Bilirubin: 0.4 mg/dL (ref 0.2–1.2)

## 2015-11-05 LAB — CBC WITH DIFFERENTIAL/PLATELET
BASOS PCT: 0.3 % (ref 0.0–3.0)
Basophils Absolute: 0 10*3/uL (ref 0.0–0.1)
EOS PCT: 2.5 % (ref 0.0–5.0)
Eosinophils Absolute: 0.2 10*3/uL (ref 0.0–0.7)
HCT: 43.4 % (ref 39.0–52.0)
HEMOGLOBIN: 14.6 g/dL (ref 13.0–17.0)
LYMPHS ABS: 2.5 10*3/uL (ref 0.7–4.0)
Lymphocytes Relative: 31.8 % (ref 12.0–46.0)
MCHC: 33.6 g/dL (ref 30.0–36.0)
MCV: 84.6 fl (ref 78.0–100.0)
MONOS PCT: 5.9 % (ref 3.0–12.0)
Monocytes Absolute: 0.5 10*3/uL (ref 0.1–1.0)
NEUTROS PCT: 59.5 % (ref 43.0–77.0)
Neutro Abs: 4.7 10*3/uL (ref 1.4–7.7)
Platelets: 329 10*3/uL (ref 150.0–400.0)
RBC: 5.13 Mil/uL (ref 4.22–5.81)
RDW: 13.7 % (ref 11.5–15.5)
WBC: 7.9 10*3/uL (ref 4.0–10.5)

## 2015-11-05 MED ORDER — DICYCLOMINE HCL 20 MG PO TABS: 20.0000 mg | ORAL_TABLET | Freq: Two times a day (BID) | ORAL | Status: DC

## 2015-11-05 NOTE — Patient Instructions (Addendum)
It was good to see you today- I am happy to see you doing so well! Keep an eye on your BP- if you stay under 130/85 we can keep you off the blood pressure medication. Let me know if you are running higher than this Keep up the good work with weight loss.  Eating healthy and exercising as you are able is the best way to go about this.  We will check your labs today to screen for cholesterol, diabetes, anemia Let me know when you need further refills of your medications Let's plan to meet in 6 months, sooner if you need anything

## 2015-11-05 NOTE — Progress Notes (Signed)
Pre visit review using our clinic review tool, if applicable. No additional management support is needed unless otherwise documented below in the visit note. 

## 2015-11-05 NOTE — Progress Notes (Signed)
[facility] [address] [place], [place] [address] [phone]  Date:  11/05/2015   Name:  Timothy Solis   DOB:  Aug 12, 1965   MRN:  [id number]  PCP:  Abbe Amsterdam, MD    Chief Complaint: Establish Care   History of Present Illness:  Timothy Solis is a 50 y.o. very pleasant male patient who presents with the following:  Here today as a new patient. I Have cared for him in the past but it has been several years.   He has moved to [place] and plans to see me as PCP again.  He has been cared for in [place] the last few years by [employer].   He had been seeing a psychiatrist in [place].  He was being treated for "severe depression, which also developed in to suicidal thoughts.  Then I started medication and these went away."  He reports that he is doing very well currently as far as his mood.  He is on celexa 20 mg and rarely uses lorazepam. He has about used up his current rx of loraepam but has a RF that he can pick up  History of HTN on lisinopril but he has been out of the medication for about 2 weeks and his BP looks good. He has not noted any sx of hypotension such as dizziness/ lightheaded feeling.  History of  asthma on qvar, singulair but he is not currently using these medications.  No current wheezing He has been diagnosed with chronic fatigue and is permanently disabled with medicare disability due to this.  He is not working now due to his chronic fatigue  He also has IBS and has used colestid in the past- he does use bentyl still and needs this refilled.    He no longer has to use pain meds for his back since he did PT.  He will take a flexeril on occasion  Last labs about a year ago- he would like to do today He is due for a tetanus shot  Wt Readings from Last 3 Encounters:  11/05/15 353 lb 12.8 oz (160.483 kg)  03/10/12 312 lb (141.522 kg)  12/23/11 319 lb (144.697 kg)   BP Readings from Last 3  Encounters:  11/05/15 111/66  03/10/12 121/81  12/23/11 130/84     Patient Active Problem List   Diagnosis Date Noted  . Allergic rhinitis, cause unspecified 04/01/2013  . Unspecified asthma(493.90) 04/01/2013  . Asthma exacerbation 03/10/2012    Past Medical History  Diagnosis Date  . GERD (gastroesophageal reflux disease)   . Obesity   . Nephrolithiasis   . HTN (hypertension)   . IBS (irritable bowel syndrome)   . Allergy   . IBS (irritable bowel syndrome)   . Chronic fatigue syndrome   . Depression     Past Surgical History  Procedure Laterality Date  . Back surgery    . Knee surgery    . Cholecystectomy      Social History  Substance Use Topics  . Smoking status: Former Games developer  . Smokeless tobacco: None  . Alcohol Use: None    Family History  Problem Relation Age of Onset  . Coronary artery disease    . Cancer      Allergies  Allergen Reactions  . Ceclor [Cefaclor]     Can't remember what reaction had  . Oxycodone Itching  . Prednisone     Medication list has  been reviewed and updated.  Current Outpatient Prescriptions on File Prior to Visit  Medication Sig Dispense Refill  . DiphenhydrAMINE HCl (BENADRYL ALLERGY PO) Take by mouth. Prn       . fexofenadine (ALLEGRA) 180 MG tablet Take 180 mg by mouth daily.    . fluticasone (FLONASE) 50 MCG/ACT nasal spray Place 2 sprays into the nose daily. 1 g 5  . lisinopril (PRINIVIL,ZESTRIL) 20 MG tablet TAKE 1 TABLET BY MOUTH EVERY DAY 30 tablet 0  . LORazepam (ATIVAN) 1 MG tablet Take 1 tablet (1 mg total) by mouth at bedtime. 30 tablet 0  . omeprazole (PRILOSEC) 10 MG capsule Take 10 mg by mouth daily.    . ALBUTEROL IN Inhale into the lungs. Reported on 11/05/2015    . beclomethasone (QVAR) 80 MCG/ACT inhaler Inhale 1 puff into the lungs 2 (two) times daily. 1 Inhaler 12  . montelukast (SINGULAIR) 10 MG tablet Take 1 tablet (10 mg total) by mouth at bedtime. 30 tablet 3  . oxymetazoline (AFRIN) 0.05 %  nasal spray Place 2 sprays into the nose 2 (two) times daily. Reported on 11/05/2015    . ranitidine (ZANTAC) 150 MG tablet Take 150 mg by mouth 2 (two) times daily. Reported on 11/05/2015    . sertraline (ZOLOFT) 100 MG tablet Take 100 mg by mouth daily. Reported on 11/05/2015     No current facility-administered medications on file prior to visit.    Review of Systems:  As per HPI- otherwise negative.   Physical Examination: Filed Vitals:   11/05/15 1210  BP: 111/66  Pulse: 86  Temp: 98.9 F (37.2 C)    GEN: WDWN, NAD, Non-toxic, A & O x 3, morbid obesity with BMI of 50 HEENT: Atraumatic, Normocephalic. Neck supple. No masses, No LAD. Ears and Nose: No external deformity. CV: RRR, No M/G/R. No JVD. No thrill. No extra heart sounds. PULM: CTA B, no wheezes, crackles, rhonchi. No retractions. No resp. distress. No accessory muscle use. EXTR: No c/c/e NEURO Normal gait.  PSYCH: Normally interactive. Conversant. Not depressed or anxious appearing.  Calm demeanor.    Assessment and Plan: Major depressive disorder with single episode, in remission (HCC)  IBS (irritable bowel syndrome) - Plan: dicyclomine (BENTYL) 20 MG tablet  Immunization due - Plan: Tdap vaccine greater than or equal to 7yo IM  Morbid obesity, unspecified obesity type (HCC) - Plan: Comprehensive metabolic panel, Lipid panel, Hemoglobin A1c  Screening for hyperlipidemia - Plan: Lipid panel  Screening for diabetes mellitus - Plan: Comprehensive metabolic panel, Hemoglobin A1c  Screening for deficiency anemia - Plan: CBC with Differential/Platelet  Chronic fatigue  tdap today, labs pending.  Refilled his bentyl His depression is well controlled at this time, I will RF meds when needed He is not on BP medication right now but his BP looks fine.   He will monitor this for me and we will restart medication if needed   Signed Abbe AmsterdamJessica Christianna Belmonte, MD

## 2015-11-06 ENCOUNTER — Encounter: Payer: Self-pay | Admitting: Family Medicine

## 2015-11-19 ENCOUNTER — Encounter: Payer: Self-pay | Admitting: Family Medicine

## 2015-11-19 DIAGNOSIS — K589 Irritable bowel syndrome without diarrhea: Secondary | ICD-10-CM

## 2015-11-21 MED ORDER — DICYCLOMINE HCL 20 MG PO TABS: 20.0000 mg | ORAL_TABLET | Freq: Two times a day (BID) | ORAL | Status: DC

## 2015-11-21 MED ORDER — CITALOPRAM HYDROBROMIDE 20 MG PO TABS: 20.0000 mg | ORAL_TABLET | Freq: Every day | ORAL | Status: DC

## 2016-02-27 DIAGNOSIS — K05219 Aggressive periodontitis, localized, unspecified severity: Secondary | ICD-10-CM | POA: Diagnosis not present

## 2016-05-07 ENCOUNTER — Encounter: Payer: Self-pay | Admitting: Family Medicine

## 2016-05-07 ENCOUNTER — Ambulatory Visit (INDEPENDENT_AMBULATORY_CARE_PROVIDER_SITE_OTHER): Payer: Medicare Other | Admitting: Family Medicine

## 2016-05-07 VITALS — BP 139/85 | HR 93 | Temp 98.2°F | Ht 70.5 in | Wt 361.2 lb

## 2016-05-07 DIAGNOSIS — G8929 Other chronic pain: Secondary | ICD-10-CM

## 2016-05-07 DIAGNOSIS — M545 Low back pain: Secondary | ICD-10-CM | POA: Diagnosis not present

## 2016-05-07 DIAGNOSIS — F325 Major depressive disorder, single episode, in full remission: Secondary | ICD-10-CM | POA: Diagnosis not present

## 2016-05-07 DIAGNOSIS — Z9189 Other specified personal risk factors, not elsewhere classified: Secondary | ICD-10-CM | POA: Diagnosis not present

## 2016-05-07 DIAGNOSIS — Z23 Encounter for immunization: Secondary | ICD-10-CM

## 2016-05-07 MED ORDER — CYCLOBENZAPRINE HCL 10 MG PO TABS: 10.0000 mg | ORAL_TABLET | ORAL | 1 refills | Status: DC | PRN

## 2016-05-07 NOTE — Progress Notes (Signed)
Pre visit review using our clinic review tool, if applicable. No additional management support is needed unless otherwise documented below in the visit note. 

## 2016-05-07 NOTE — Progress Notes (Signed)
[facility] at [facility] [address] [place], [place] [address] [phone]  Date:  05/07/2016   Name:  Timothy Solis   DOB:  [birth year]   MRN:  [id number]  PCP:  Abbe Amsterdam, MD    Chief Complaint: Follow-up (Pt here for 6 month f/u. Would like 30 day refill on Flexeril. Flu vaccine today. Pt wanted to mention that he has had to use inhaler some. )   History of Present Illness:  Timothy Solis is a 50 y.o. very pleasant male patient who presents with the following:  Here today for a recheck visit- last seen here in April  Here today as a new patient. I Have cared for him in the past but it has been several years.   He has moved to [place] and plans to see me as PCP again.  He has been cared for in [place] the last few years by [employer].   He had been seeing a psychiatrist in [place].  He was being treated for "severe depression, which also developed in to suicidal thoughts.  Then I started medication and these went away."  He reports that he is doing very well currently as far as his mood.  He is on celexa 20 mg and rarely uses lorazepam. He has about used up his current rx of loraepam but has a RF that he can pick up  History of HTN on lisinopril but he has been out of the medication for about 2 weeks and his BP looks good. He has not noted any sx of hypotension such as dizziness/ lightheaded feeling.  History of  asthma on qvar, singulair but he is not currently using these medications.  No current wheezing He has been diagnosed with chronic fatigue and is permanently disabled with medicare disability due to this.  He is not working now due to his chronic fatigue  He also has IBS and has used colestid in the past- he does use bentyl still and needs this refilled.   He no longer has to use pain meds for his back since he did PT.  He will take a flexeril on occasion\  He is here today for a recheck visit He would like a flu  shot today  He is not taking any BP medication and is following his home readings- he is doing pretty well He has used albuterol inhaler a couple of times and the neb machine once.  He is not sure if this was due to pollen or weather change.  In general he is not having trouble with his asthma Last used albuterol about 3 days ago  Wt Readings from Last 3 Encounters:  05/07/16 (!) 361 lb 3.2 oz (163.8 kg)  11/05/15 (!) 353 lb 12.8 oz (160.5 kg)  03/10/12 (!) 312 lb (141.5 kg)   He is not feeling wheezy at this time   He uses flexeril as needed for back pain- he does not need this very often.  He will use exercises as needed - however sometimes he will have spasms even with the exercises.    He uses flonase in the spring He is pleased that he is doing well with celexa- he is not currently using ativan He is using cpap machine for OSA at night   He is not fasting today. 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  Per [employer] he tried using some stimulant drugs to try and increase his energy- however this did not seem to work.  He notes that his severe chronic fatigue sx leave him unable to get much exercise or activity some days  He also notes that some days he will just feel hungry all day and will eat too much food- he tries to keep it to healthy food. His appetite will vary a lot from day to day.  He does try to keep on a schedule of eating but sometimes just feels so hungry that he overeats without meaning to   Patient Active Problem List   Diagnosis Date Noted  . IBS (irritable bowel syndrome) 11/05/2015  . Morbid obesity (HCC) 11/05/2015  . Major depressive disorder with single episode, in remission (HCC) 11/05/2015  . Chronic fatigue 11/05/2015  . Allergic rhinitis, cause unspecified 04/01/2013  . Unspecified asthma(493.90) 04/01/2013  . Asthma exacerbation 03/10/2012    Past Medical History:  Diagnosis Date  . Allergy    . Chronic fatigue syndrome   . Depression   . GERD (gastroesophageal reflux disease)   . HTN (hypertension)   . IBS (irritable bowel syndrome)   . IBS (irritable bowel syndrome)   . Nephrolithiasis   . Obesity     Past Surgical History:  Procedure Laterality Date  . BACK SURGERY    . CHOLECYSTECTOMY    . KNEE SURGERY      Social History  Substance Use Topics  . Smoking status: Former Games developermoker  . Smokeless tobacco: Not on file  . Alcohol use Not on file    Family History  Problem Relation Age of Onset  . Coronary artery disease    . Cancer      Allergies  Allergen Reactions  . Ceclor [Cefaclor]     Can't remember what reaction had  . Oxycodone Itching  . Prednisone     Medication list has been reviewed and updated.  Current Outpatient Prescriptions on File Prior to Visit  Medication Sig Dispense Refill  . ALBUTEROL IN Inhale into the lungs. Reported on 11/05/2015    . citalopram (CELEXA) 20 MG tablet Take 1 tablet (20 mg total) by mouth daily. 90 tablet 3  . colestipol (COLESTID) 1 g tablet Take 1 g by mouth as needed.    . cyclobenzaprine (FLEXERIL) 10 MG tablet Take 10 mg by mouth as needed for muscle spasms.    Marland Kitchen. dicyclomine (BENTYL) 20 MG tablet Take 1 tablet (20 mg total) by mouth 2 (two) times daily. 180 tablet 3  . DiphenhydrAMINE HCl (BENADRYL ALLERGY PO) Take by mouth. Prn       . fexofenadine (ALLEGRA) 180 MG tablet Take 180 mg by mouth daily.    . fluticasone (FLONASE) 50 MCG/ACT nasal spray Place 2 sprays into the nose daily. 1 g 5  . LORazepam (ATIVAN) 1 MG tablet Take 1 tablet (1 mg total) by mouth at bedtime. 30 tablet 0  . omeprazole (PRILOSEC) 10 MG capsule Take 10 mg by mouth daily.    Marland Kitchen. oxymetazoline (AFRIN) 0.05 % nasal spray Place 2 sprays into the nose 2 (two) times daily. Reported on 11/05/2015    . ranitidine (ZANTAC) 150 MG tablet Take 150 mg by mouth 2 (two) times daily. Reported on 11/05/2015    . beclomethasone (QVAR) 80 MCG/ACT inhaler  Inhale 1 puff into the lungs 2 (two) times daily. 1 Inhaler 12  . montelukast (SINGULAIR) 10 MG tablet Take 1 tablet (10 mg total)  by mouth at bedtime. 30 tablet 3   No current facility-administered medications on file prior to visit.     Review of Systems:  As per HPI- otherwise negative.  No nausea, vomiting, fever, chills  Physical Examination: Vitals:   05/07/16 1437  BP: 139/85  Pulse: 93  Temp: 98.2 F (36.8 C)   Vitals:   05/07/16 1437  Weight: (!) 361 lb 3.2 oz (163.8 kg)  Height: 5' 10.5" (1.791 m)   Body mass index is 51.09 kg/m. Ideal Body Weight: Weight in (lb) to have BMI = 25: 176.4  GEN: WDWN, NAD, Non-toxic, A & O x 3 HEENT: Atraumatic, Normocephalic. Neck supple. No masses, No LAD.  Bilateral TM wnl, oropharynx normal.  PEERL,EOMI.   Ears and Nose: No external deformity. CV: RRR, No M/G/R. No JVD. No thrill. No extra heart sounds. PULM: CTA B, no wheezes, crackles, rhonchi. No retractions. No resp. distress. No accessory muscle use. ABD: S, NT, ND, +BS. No rebound. No HSM. EXTR: No c/c/e NEURO Normal gait.  PSYCH: Normally interactive. Conversant. Not depressed or anxious appearing.  Calm demeanor.  Very obese, otherwise he looks well  Assessment and Plan: Major depressive disorder with single episode, in remission (HCC)  Chronic bilateral low back pain without sciatica - Plan: cyclobenzaprine (FLEXERIL) 10 MG tablet  Encounter for immunization - Plan: Flu Vaccine QUAD 36+ mos IM  Sedentary lifestyle  Morbid obesity (HCC)   Here today for a recheck] Flu shot His depression is doing well- continue current medications Refilled his flexeril to use as needed for back pain At this time he is not really thinking about weight loss surgery Suggested that he try water aerobics as this may be easier to him to tolerate   Signed Abbe Amsterdam, MD Pt noted onset of a clammy, pre-syncopal feeling in lobby after visit/ flu shot.  Came back into clinic-  noted to have glucose of 120, BP 146/104, P 90, 97%.  He was given apple juice and a snack, rested for about 15 minutes until sx were resolved.  BP recheck 150/95, P 75.  He felt ok to go home, declined ER eval, he has a friend driving him today.  Released to home following this apparent vagal response to shot

## 2016-05-07 NOTE — Patient Instructions (Signed)
It was good to see you today- take care and please come and see me in the spring for a recheck and fasting labs.

## 2016-08-21 ENCOUNTER — Other Ambulatory Visit: Payer: Self-pay | Admitting: Family Medicine

## 2016-08-21 DIAGNOSIS — G8929 Other chronic pain: Secondary | ICD-10-CM

## 2016-08-21 DIAGNOSIS — M545 Low back pain: Principal | ICD-10-CM

## 2016-08-21 NOTE — Telephone Encounter (Signed)
Received refill request for cyclobenzaprine (FLEXERIL) 10 MG tablet. Last office visit and refill 05/07/16. Is it ok to refill? Please advise.

## 2016-08-22 NOTE — Telephone Encounter (Signed)
Reviewed NCCSR- no entries.  Will refill

## 2016-10-02 ENCOUNTER — Encounter: Payer: Self-pay | Admitting: Family Medicine

## 2016-10-02 MED ORDER — DIAZEPAM 2 MG PO TABS: 2.0000 mg | ORAL_TABLET | Freq: Three times a day (TID) | ORAL | 0 refills | Status: DC | PRN

## 2016-10-02 NOTE — Telephone Encounter (Signed)
Called pt and LMOM checking on him.  Will do refill and reply to [employer]

## 2016-10-13 ENCOUNTER — Encounter: Payer: Self-pay | Admitting: Family Medicine

## 2016-10-13 ENCOUNTER — Ambulatory Visit (INDEPENDENT_AMBULATORY_CARE_PROVIDER_SITE_OTHER): Payer: Medicare Other | Admitting: Family Medicine

## 2016-10-13 VITALS — BP 132/86 | HR 96 | Temp 98.6°F | Ht 70.5 in | Wt 371.0 lb

## 2016-10-13 DIAGNOSIS — R5382 Chronic fatigue, unspecified: Secondary | ICD-10-CM

## 2016-10-13 DIAGNOSIS — J4520 Mild intermittent asthma, uncomplicated: Secondary | ICD-10-CM

## 2016-10-13 DIAGNOSIS — F410 Panic disorder [episodic paroxysmal anxiety] without agoraphobia: Secondary | ICD-10-CM | POA: Diagnosis not present

## 2016-10-13 NOTE — Progress Notes (Signed)
[facility] at [facility] [address] [place], [place] [address] [phone]  Date:  10/13/2016   Name:  Timothy Solis   DOB:  [birth year]   MRN:  [id number]  PCP:  Abbe Amsterdam, MD    Chief Complaint: Anxiety (patient repoted that 3 days ago he had an anxiety attack; racing thoughts and insmonia prior to the attack)   History of Present Illness:  Timothy Solis is a 51 y.o. very pleasant male patient who presents with the following:  Patient here today to discuss recent anxiety attacks.    Denies any suicidal ideation or self harm.    I last saw him in the office back in October at which point he was doing well- however received an email from him about 10 days ago with concern of a panic attack/ acute anxiety. I refilled his valium for him but asked him to come in to check on how he is doing.  He had noted evening sx of anxiety, fear and dread, and difficulty sleeping Today he notes that his anxiety is getting better- he has not needed valium in 2-3 days now  He feels that his depression is under control- he uses celexa 20 mg with good effect  He has chronic fatigue and generally sleeps a lot.  This made his difficulty sleeping even more unusual He was NOT have sx of mania- he was tired and wanted to sleep very badly but could not get to sleep He uses the valium every night for 3 nights and slept well, then he was able to taper off this.    He has some valium left over- he does not need another rx at this time but will let me know if he needs a refill  He is otherwise stable.  He is using some homeopathic medications for his chronic fatigue and feels like this does help even out his sx  He has noted a little wheezing recently which he attributes to allergies. He has an inhaler and allergy medications that he will use as needed.  He has not had any serious sx or any distress     Patient Active Problem List   Diagnosis Date  Noted  . IBS (irritable bowel syndrome) 11/05/2015  . Morbid obesity (HCC) 11/05/2015  . Major depressive disorder with single episode, in remission (HCC) 11/05/2015  . Chronic fatigue 11/05/2015  . Allergic rhinitis, cause unspecified 04/01/2013  . Unspecified asthma(493.90) 04/01/2013  . Asthma exacerbation 03/10/2012    Past Medical History:  Diagnosis Date  . Allergy   . Chronic fatigue syndrome   . Depression   . GERD (gastroesophageal reflux disease)   . HTN (hypertension)   . IBS (irritable bowel syndrome)   . IBS (irritable bowel syndrome)   . Nephrolithiasis   . Obesity     Past Surgical History:  Procedure Laterality Date  . BACK SURGERY    . CHOLECYSTECTOMY    . KNEE SURGERY      Social History  Substance Use Topics  . Smoking status: Former Games developer  . Smokeless tobacco: Never Used  . Alcohol use Not on file    Family History  Problem Relation Age of Onset  . Coronary artery disease    . Cancer      Allergies  Allergen Reactions  . Ceclor [Cefaclor]     Can't remember what reaction had  . Oxycodone Itching  . Prednisone  Medication list has been reviewed and updated.  Current Outpatient Prescriptions on File Prior to Visit  Medication Sig Dispense Refill  . ALBUTEROL IN Inhale into the lungs. Reported on 11/05/2015    . citalopram (CELEXA) 20 MG tablet Take 1 tablet (20 mg total) by mouth daily. 90 tablet 3  . colestipol (COLESTID) 1 g tablet Take 1 g by mouth as needed.    . cyclobenzaprine (FLEXERIL) 10 MG tablet TAKE ONE TABLET BY MOUTH ONCE DAILY AS NEEDED FOR  MUSCLE  SPASMS 30 tablet 1  . diazepam (VALIUM) 2 MG tablet Take 1 tablet (2 mg total) by mouth every 8 (eight) hours as needed for anxiety. 20 tablet 0  . dicyclomine (BENTYL) 20 MG tablet Take 1 tablet (20 mg total) by mouth 2 (two) times daily. 180 tablet 3  . DiphenhydrAMINE HCl (BENADRYL ALLERGY PO) Take by mouth. Prn       . fexofenadine (ALLEGRA) 180 MG tablet Take 180 mg by  mouth daily.    Marland Kitchen. omeprazole (PRILOSEC) 10 MG capsule Take 10 mg by mouth daily.    . ranitidine (ZANTAC) 150 MG tablet Take 150 mg by mouth 2 (two) times daily. Reported on 11/05/2015    . fluticasone (FLONASE) 50 MCG/ACT nasal spray Place 2 sprays into the nose daily. (Patient not taking: Reported on 10/13/2016) 1 g 5   No current facility-administered medications on file prior to visit.     Review of Systems:  As per HPI- otherwise negative.  No fever or chills No GI symptoms    Physical Examination: Vitals:   10/13/16 1534  BP: 132/86  Pulse: 96  Temp: 98.6 F (37 C)   Vitals:   10/13/16 1534  Weight: (!) 371 lb (168.3 kg)  Height: 5' 10.5" (1.791 m)   Body mass index is 52.48 kg/m. Ideal Body Weight: Weight in (lb) to have BMI = 25: 176.4  GEN: WDWN, NAD, Non-toxic, A & O x 3, quite obese, otherwise looks well HEENT: Atraumatic, Normocephalic. Neck supple. No masses, No LAD. Ears and Nose: No external deformity. CV: RRR, No M/G/R. No JVD. No thrill. No extra heart sounds. PULM: very minimal wheeze ausculted on left. crackles, rhonchi. No retractions. No resp. distress. No accessory muscle use. EXTR: No c/c/e NEURO Normal gait.  PSYCH: Normally interactive. Conversant. Not depressed or anxious appearing.  Calm demeanor.    Assessment and Plan:  Anxiety attack  Chronic fatigue  Mild intermittent asthma without complication  His anxiety is better and back at baseline.  He is still using his celexa and depression is resolved.  He will continue to keep me updated regarding his sx and will alert me if not doing ok He has albuterol that he can use as needed for asthma related to seasonal allergies   Signed Abbe AmsterdamJessica Seanpaul Preece, MD

## 2016-10-13 NOTE — Progress Notes (Signed)
Pre visit review using our clinic review tool, if applicable. No additional management support is needed unless otherwise documented below in the visit note. 

## 2016-10-13 NOTE — Patient Instructions (Signed)
I am glad that you are doing better!  Continue to use your albuterol as needed for breathing and valium if you need it.  We will see you next month for your physical

## 2016-11-10 ENCOUNTER — Ambulatory Visit (INDEPENDENT_AMBULATORY_CARE_PROVIDER_SITE_OTHER): Payer: Medicare Other | Admitting: Family Medicine

## 2016-11-10 ENCOUNTER — Encounter: Payer: Self-pay | Admitting: Family Medicine

## 2016-11-10 VITALS — BP 152/90 | HR 96 | Temp 99.4°F | Ht 70.5 in | Wt 374.4 lb

## 2016-11-10 DIAGNOSIS — G8929 Other chronic pain: Secondary | ICD-10-CM

## 2016-11-10 DIAGNOSIS — Z130 Encounter for screening for diseases of the blood and blood-forming organs and certain disorders involving the immune mechanism: Secondary | ICD-10-CM

## 2016-11-10 DIAGNOSIS — M5441 Lumbago with sciatica, right side: Secondary | ICD-10-CM

## 2016-11-10 DIAGNOSIS — M5442 Lumbago with sciatica, left side: Secondary | ICD-10-CM | POA: Diagnosis not present

## 2016-11-10 DIAGNOSIS — J4520 Mild intermittent asthma, uncomplicated: Secondary | ICD-10-CM

## 2016-11-10 DIAGNOSIS — Z1329 Encounter for screening for other suspected endocrine disorder: Secondary | ICD-10-CM

## 2016-11-10 DIAGNOSIS — R7309 Other abnormal glucose: Secondary | ICD-10-CM

## 2016-11-10 DIAGNOSIS — F325 Major depressive disorder, single episode, in full remission: Secondary | ICD-10-CM | POA: Diagnosis not present

## 2016-11-10 DIAGNOSIS — Z5181 Encounter for therapeutic drug level monitoring: Secondary | ICD-10-CM

## 2016-11-10 DIAGNOSIS — Z131 Encounter for screening for diabetes mellitus: Secondary | ICD-10-CM | POA: Diagnosis not present

## 2016-11-10 DIAGNOSIS — Z0000 Encounter for general adult medical examination without abnormal findings: Secondary | ICD-10-CM | POA: Diagnosis not present

## 2016-11-10 DIAGNOSIS — Z13220 Encounter for screening for lipoid disorders: Secondary | ICD-10-CM

## 2016-11-10 LAB — COMPREHENSIVE METABOLIC PANEL
ALT: 22 U/L (ref 0–53)
AST: 23 U/L (ref 0–37)
Albumin: 4.3 g/dL (ref 3.5–5.2)
Alkaline Phosphatase: 97 U/L (ref 39–117)
BUN: 18 mg/dL (ref 6–23)
CO2: 25 meq/L (ref 19–32)
Calcium: 9.8 mg/dL (ref 8.4–10.5)
Chloride: 103 mEq/L (ref 96–112)
Creatinine, Ser: 1.04 mg/dL (ref 0.40–1.50)
GFR: 80.76 mL/min (ref >60.00)
GLUCOSE: 93 mg/dL (ref 70–99)
POTASSIUM: 4 meq/L (ref 3.5–5.1)
SODIUM: 139 meq/L (ref 135–145)
Total Bilirubin: 0.4 mg/dL (ref 0.2–1.2)
Total Protein: 7.7 g/dL (ref 6.0–8.3)

## 2016-11-10 LAB — LIPID PANEL
Cholesterol: 222 mg/dL — ABNORMAL HIGH (ref 0–200)
HDL: 44.2 mg/dL (ref >39.00)
NONHDL: 177.69
Total CHOL/HDL Ratio: 5
Triglycerides: 214 mg/dL — ABNORMAL HIGH (ref 0.0–149.0)
VLDL: 42.8 mg/dL — AB (ref 0.0–40.0)

## 2016-11-10 LAB — CBC
HEMATOCRIT: 46.1 % (ref 39.0–52.0)
HEMOGLOBIN: 15.6 g/dL (ref 13.0–17.0)
MCHC: 33.8 g/dL (ref 30.0–36.0)
MCV: 84.1 fl (ref 78.0–100.0)
Platelets: 289 10*3/uL (ref 150.0–400.0)
RBC: 5.48 Mil/uL (ref 4.22–5.81)
RDW: 13.6 % (ref 11.5–15.5)
WBC: 9.3 10*3/uL (ref 4.0–10.5)

## 2016-11-10 LAB — LDL CHOLESTEROL, DIRECT: Direct LDL: 149 mg/dL

## 2016-11-10 LAB — HEMOGLOBIN A1C: HEMOGLOBIN A1C: 6 % (ref 4.6–6.5)

## 2016-11-10 MED ORDER — HYDROCODONE-ACETAMINOPHEN 5-325 MG PO TABS: 1.0000 | ORAL_TABLET | Freq: Four times a day (QID) | ORAL | 0 refills | Status: DC | PRN

## 2016-11-10 MED ORDER — CITALOPRAM HYDROBROMIDE 20 MG PO TABS: 20.0000 mg | ORAL_TABLET | Freq: Every day | ORAL | 3 refills | Status: DC

## 2016-11-10 NOTE — Patient Instructions (Addendum)
It was good to see you again today!   I will be in touch with your labs Please let me know if your breathing does not go back to your usual baseline; if you continue to need your albuterol a lot we may need to add a "controller" inhaler  Use the hydrocodone sparingly as needed for your back pain  Your BP is a bit high today- please monitor this for me. If you are consistently higher than 140/90 please let me know

## 2016-11-10 NOTE — Progress Notes (Signed)
Pre visit review using our clinic tool,if applicable. No additional management support is needed unless otherwise documented below in the visit note.  

## 2016-11-10 NOTE — Progress Notes (Addendum)
[facility] at [facility] [address] [place], [place] [address] [phone]  Date:  11/10/2016   Name:  Timothy Solis   DOB:  Dec 25, 1965   MRN:  [id number]  PCP:  Abbe Amsterdam, MD    Chief Complaint: Follow-up   History of Present Illness:  Timothy Solis is a 51 y.o. very pleasant male patient who presents with the following:  History of asthma, IBS, obesity, depression/ anxiety.  Here today seeking a CPE He notes that last week he had flare ups of his IBS, diarrhea, worsening fatigue, headache due to pollen. These sx are all getting better now for which he is grateful.  He spend most of last week resting until he felt better His asthma was also worse- he had to use his inhaler a couple of times. This is better but still not quite back at baseline  Last labs about a year ago He had to use some of his hydrocodone for his back pain over the weekend.  He last got an rx in early 2017- he uses this only on very rare occasion.  Would like a refill to have on hand as he is down to his last couple of pills.   He is feeling back towards his baseline today as far as his back pain  He does have an albuterol neb that he will use prn He uses flonase as needed but has let this lapse  Flu and tetanus shots are UTD  He had just a slim fast shake this am- otherwise he is fasting   NCCSR: no narcotics, just the 20 diazepam he got from me in March of this year. He does NOT need more of these todya  He does monitor his BP at home on a regular basis- generally 120s/80s BP Readings from Last 3 Encounters:  11/10/16 (!) 152/90  10/13/16 132/86  05/07/16 139/85   Wt Readings from Last 3 Encounters:  11/10/16 (!) 374 lb 6.4 oz (169.8 kg)  10/13/16 (!) 371 lb (168.3 kg)  05/07/16 (!) 361 lb 3.2 oz (163.8 kg)     Patient Active Problem List   Diagnosis Date Noted  . IBS (irritable bowel syndrome) 11/05/2015  . Morbid obesity (HCC)  11/05/2015  . Major depressive disorder with single episode, in remission (HCC) 11/05/2015  . Chronic fatigue 11/05/2015  . Allergic rhinitis, cause unspecified 04/01/2013  . Unspecified asthma(493.90) 04/01/2013  . Asthma exacerbation 03/10/2012    Past Medical History:  Diagnosis Date  . Allergy   . Chronic fatigue syndrome   . Depression   . GERD (gastroesophageal reflux disease)   . HTN (hypertension)   . IBS (irritable bowel syndrome)   . IBS (irritable bowel syndrome)   . Nephrolithiasis   . Obesity     Past Surgical History:  Procedure Laterality Date  . BACK SURGERY    . CHOLECYSTECTOMY    . KNEE SURGERY      Social History  Substance Use Topics  . Smoking status: Former Games developer  . Smokeless tobacco: Never Used  . Alcohol use Not on file    Family History  Problem Relation Age of Onset  . Coronary artery disease    . Cancer      Allergies  Allergen Reactions  . Ceclor [Cefaclor]     Can't remember what reaction had  . Oxycodone Itching  . Prednisone     Medication list has been reviewed and updated.  Current Outpatient Prescriptions on File Prior to Visit  Medication Sig Dispense Refill  . ALBUTEROL IN Inhale into the lungs. Reported on 11/05/2015    . citalopram (CELEXA) 20 MG tablet Take 1 tablet (20 mg total) by mouth daily. 90 tablet 3  . colestipol (COLESTID) 1 g tablet Take 1 g by mouth as needed.    . cyclobenzaprine (FLEXERIL) 10 MG tablet TAKE ONE TABLET BY MOUTH ONCE DAILY AS NEEDED FOR  MUSCLE  SPASMS 30 tablet 1  . diazepam (VALIUM) 2 MG tablet Take 1 tablet (2 mg total) by mouth every 8 (eight) hours as needed for anxiety. 20 tablet 0  . dicyclomine (BENTYL) 20 MG tablet Take 1 tablet (20 mg total) by mouth 2 (two) times daily. 180 tablet 3  . DiphenhydrAMINE HCl (BENADRYL ALLERGY PO) Take by mouth. Prn       . fluticasone (FLONASE) 50 MCG/ACT nasal spray Place 2 sprays into the nose daily. 1 g 5  . omeprazole (PRILOSEC) 10 MG capsule  Take 10 mg by mouth daily.    . ranitidine (ZANTAC) 150 MG tablet Take 150 mg by mouth 2 (two) times daily. Reported on 11/05/2015    . fexofenadine (ALLEGRA) 180 MG tablet Take 180 mg by mouth daily.     No current facility-administered medications on file prior to visit.     Review of Systems:  As per HPI- otherwise negative.   Physical Examination: Vitals:   11/10/16 1334  BP: (!) 152/90  Pulse: (!) 107  Temp: 99.4 F (37.4 C)   Vitals:   11/10/16 1334  Weight: (!) 374 lb 6.4 oz (169.8 kg)  Height: 5' 10.5" (1.791 m)   Body mass index is 52.96 kg/m. Ideal Body Weight: Weight in (lb) to have BMI = 25: 176.4  GEN: WDWN, NAD, Non-toxic, A & O x 3, obese, looks his normal self  HEENT: Atraumatic, Normocephalic. Neck supple. No masses, No LAD. Ears and Nose: No external deformity. CV: RRR, No M/G/R. No JVD. No thrill. No extra heart sounds. PULM: CTA B, no wheezes, crackles, rhonchi. No retractions. No resp. distress. No accessory muscle use. ABD: S, NT, ND, +BS. No rebound. No HSM. EXTR: No c/c/e NEURO Normal gait.  PSYCH: Normally interactive. Conversant. Not depressed or anxious appearing.  Calm demeanor.    Assessment and Plan: Physical exam  Screening for deficiency anemia - Plan: CBC  Screening for diabetes mellitus - Plan: Comprehensive metabolic panel, Hemoglobin A1c  Screening for thyroid disorder  Chronic midline low back pain with bilateral sciatica - Plan: HYDROcodone-acetaminophen (NORCO/VICODIN) 5-325 MG tablet  Screening for hyperlipidemia - Plan: Lipid panel  Mild intermittent asthma without complication  Morbid obesity (HCC) - Plan: Lipid panel, Hemoglobin A1c  Major depressive disorder with single episode, in remission (HCC) - Plan: citalopram (CELEXA) 20 MG tablet  Medication monitoring encounter - Plan: CBC, Comprehensive metabolic panel  Elevated glucose - Plan: Hemoglobin A1c  Here today for a recheck visit BP is high today but he  does watch this carefully at home His asthma has been a bit worse due to pollen- he will let me know if this continues  Refilled celexa Refilled the vicodin that he uses on rare occasion for his back pain  Will plan further follow- up pending labs.   Signed Abbe Amsterdam, MD  Received labs  Results for orders placed or performed in visit on 11/10/16  CBC  Result Value Ref Range   WBC 9.3 4.0 - 10.5 K/uL  RBC 5.48 4.22 - 5.81 Mil/uL   Platelets 289.0 150.0 - 400.0 K/uL   Hemoglobin 15.6 13.0 - 17.0 g/dL   HCT 21.3 08.6 - 57.8 %   MCV 84.1 78.0 - 100.0 fl   MCHC 33.8 30.0 - 36.0 g/dL   RDW 46.9 62.9 - 52.8 %  Comprehensive metabolic panel  Result Value Ref Range   Sodium 139 135 - 145 mEq/L   Potassium 4.0 3.5 - 5.1 mEq/L   Chloride 103 96 - 112 mEq/L   CO2 25 19 - 32 mEq/L   Glucose, Bld 93 70 - 99 mg/dL   BUN 18 6 - 23 mg/dL   Creatinine, Ser 4.13 0.40 - 1.50 mg/dL   Total Bilirubin 0.4 0.2 - 1.2 mg/dL   Alkaline Phosphatase 97 39 - 117 U/L   AST 23 0 - 37 U/L   ALT 22 0 - 53 U/L   Total Protein 7.7 6.0 - 8.3 g/dL   Albumin 4.3 3.5 - 5.2 g/dL   Calcium 9.8 8.4 - 24.4 mg/dL   GFR 01.02 >72.53 mL/min  Lipid panel  Result Value Ref Range   Cholesterol 222 (H) 0 - 200 mg/dL   Triglycerides 664.4 (H) 0.0 - 149.0 mg/dL   HDL 03.47 >42.59 mg/dL   VLDL 56.3 (H) 0.0 - 87.5 mg/dL   Total CHOL/HDL Ratio 5    NonHDL 177.69   Hemoglobin A1c  Result Value Ref Range   Hgb A1c MFr Bld 6.0 4.6 - 6.5 %  LDL cholesterol, direct  Result Value Ref Range   Direct LDL 149.0 mg/dL

## 2016-11-11 ENCOUNTER — Encounter: Payer: Self-pay | Admitting: Family Medicine

## 2016-11-11 DIAGNOSIS — E785 Hyperlipidemia, unspecified: Secondary | ICD-10-CM

## 2016-11-12 MED ORDER — LOVASTATIN 20 MG PO TABS: 20.0000 mg | ORAL_TABLET | Freq: Every day | ORAL | 3 refills | Status: DC

## 2016-12-09 ENCOUNTER — Encounter: Payer: Self-pay | Admitting: Family Medicine

## 2016-12-23 NOTE — Progress Notes (Signed)
[facility] [address], [address] [place], [place] [address] [phone] [phone]  Date:  12/24/2016   Name:  Timothy Solis   DOB:  06/20/1966   MRN:  [id number]  PCP:  Pearline Cablesopland, Jessica C, MD    Chief Complaint: Cushings test (Patient requesting) and Medication Problem (Depression medication not working)   History of Present Illness:  Timothy Solis is a 51 y.o. very pleasant male patient who presents with the following:  Here today for a recheck visit History of depression, obesity, allergies and asthma  He has been on celexa for about a year or more- however he has noted more feelings of depression over the last couple of weeks. He is not aware of any negative changes in his life.  No real stressors His room-mate is spending less time at his home which is good; Nicola GirtCorwin has the house to himself more which he professes as a good thing He is feeling less energetic.  He is having some sadness.  He is spending more time thinking about the bad aspects of his life; he generally tried to avoid these negative thoughts No SI He is having some trouble sleeping Anxiety is bothering him more- he needs to refill valium He typically uses this a few times a week  He does not need any pain medication- he has hydrocodone that he uses on occasion He has used a few other depression medications in the past - he did see a psychiatrist in the past but does not think they had much more to offer than what we are doing now  He has suffered a lot with allergies this spring- he has needed to use his nebulizer machine sometimes He is spending most of his time indoors due to his allergies   He also has a questions about cushing's disorder.  He has joint pains which he has always attributed to old hockey injuries.  He also has central obesity, inability to lose weight.  He has done some reading/ talked to people and wonders if he might have cushing's.  He would be  interested in being tested  He feels like he cannot lose weight even if he really tries to eat better  He is using his albuterol as needed but did not need it yesterday or today Patient Active Problem List   Diagnosis Date Noted  . IBS (irritable bowel syndrome) 11/05/2015  . Morbid obesity (HCC) 11/05/2015  . Major depressive disorder with single episode, in remission (HCC) 11/05/2015  . Chronic fatigue 11/05/2015  . Allergic rhinitis, cause unspecified 04/01/2013  . Unspecified asthma(493.90) 04/01/2013  . Asthma exacerbation 03/10/2012    Past Medical History:  Diagnosis Date  . Allergy   . Chronic fatigue syndrome   . Depression   . GERD (gastroesophageal reflux disease)   . HTN (hypertension)   . IBS (irritable bowel syndrome)   . IBS (irritable bowel syndrome)   . Nephrolithiasis   . Obesity     Past Surgical History:  Procedure Laterality Date  . BACK SURGERY    . CHOLECYSTECTOMY    . KNEE SURGERY      Social History  Substance Use Topics  . Smoking status: Former Games developermoker  . Smokeless tobacco: Never Used  . Alcohol use Not on file    Family History  Problem Relation Age of Onset  . Coronary artery disease Unknown   . Cancer Unknown     Allergies  Allergen Reactions  . Ceclor [Cefaclor]     Can't remember what reaction had  . Oxycodone Itching  . Prednisone     Medication list has been reviewed and updated.  Current Outpatient Prescriptions on File Prior to Visit  Medication Sig Dispense Refill  . ALBUTEROL IN Inhale into the lungs. Reported on 11/05/2015    . colestipol (COLESTID) 1 g tablet Take 1 g by mouth as needed.    . cyclobenzaprine (FLEXERIL) 10 MG tablet TAKE ONE TABLET BY MOUTH ONCE DAILY AS NEEDED FOR  MUSCLE  SPASMS 30 tablet 1  . dicyclomine (BENTYL) 20 MG tablet Take 1 tablet (20 mg total) by mouth 2 (two) times daily. 180 tablet 3  . DiphenhydrAMINE HCl (BENADRYL ALLERGY PO) Take by mouth. Prn       . fexofenadine (ALLEGRA) 180  MG tablet Take 180 mg by mouth daily.    . fluticasone (FLONASE) 50 MCG/ACT nasal spray Place 2 sprays into the nose daily. 1 g 5  . HYDROcodone-acetaminophen (NORCO/VICODIN) 5-325 MG tablet Take 1 tablet by mouth every 6 (six) hours as needed for moderate pain. 30 tablet 0  . lovastatin (MEVACOR) 20 MG tablet Take 1 tablet (20 mg total) by mouth daily. 90 tablet 3   No current facility-administered medications on file prior to visit.     Review of Systems:  As per HPI- otherwise negative.   Physical Examination: Vitals:   12/24/16 1438  BP: 134/76  Pulse: 84  Temp: 99.6 F (37.6 C)   Vitals:   12/24/16 1438  Weight: (!) 378 lb 9.6 oz (171.7 kg)  Height: 5' 10.5" (1.791 m)   Body mass index is 53.56 kg/m. Ideal Body Weight: Weight in (lb) to have BMI = 25: 176.4  GEN: WDWN, NAD, Non-toxic, A & O x 3, morbid obesity, looks well otherwise  HEENT: Atraumatic, Normocephalic. Neck supple. No masses, No LAD. Ears and Nose: No external deformity. CV: RRR, No M/G/R. No JVD. No thrill. No extra heart sounds. PULM: CTA B, no wheezes, crackles, rhonchi. No retractions. No resp. distress. No accessory muscle use. ABD: S, NT, ND, +BS. No rebound. No HSM. EXTR: No c/c/e NEURO Normal gait for pt, he uses a cane PSYCH: Normally interactive. Conversant. Not depressed or anxious appearing.  Calm demeanor.    Assessment and Plan: Weight gain - Plan: Cortisol, urine, 24 hour  Major depressive disorder with single episode, in remission (HCC) - Plan: citalopram (CELEXA) 40 MG tablet, Cortisol, urine, 24 hour, DISCONTINUED: diazepam (VALIUM) 2 MG tablet  Chronic fatigue  Central obesity - Plan: Cortisol, urine, 24 hour  Will increase his celexa to 40 mg.  He will keep me closely apprised about his progress- See patient instructions for more details.   Ordered 24 hour urine cortisol for him; we do have the collection jugs in the office.    Will plan further follow- up pending  labs.   Signed Abbe Amsterdam, MD

## 2016-12-24 ENCOUNTER — Ambulatory Visit (INDEPENDENT_AMBULATORY_CARE_PROVIDER_SITE_OTHER): Payer: Medicare Other | Admitting: Family Medicine

## 2016-12-24 ENCOUNTER — Other Ambulatory Visit: Payer: Self-pay | Admitting: Family Medicine

## 2016-12-24 VITALS — BP 134/76 | HR 84 | Temp 99.6°F | Ht 70.5 in | Wt 378.6 lb

## 2016-12-24 DIAGNOSIS — F325 Major depressive disorder, single episode, in full remission: Secondary | ICD-10-CM

## 2016-12-24 DIAGNOSIS — E65 Localized adiposity: Secondary | ICD-10-CM | POA: Diagnosis not present

## 2016-12-24 DIAGNOSIS — R5382 Chronic fatigue, unspecified: Secondary | ICD-10-CM

## 2016-12-24 DIAGNOSIS — R635 Abnormal weight gain: Secondary | ICD-10-CM | POA: Diagnosis not present

## 2016-12-24 MED ORDER — CITALOPRAM HYDROBROMIDE 40 MG PO TABS: 40.0000 mg | ORAL_TABLET | Freq: Every day | ORAL | 6 refills | Status: DC

## 2016-12-24 MED ORDER — DIAZEPAM 2 MG PO TABS: 2.0000 mg | ORAL_TABLET | Freq: Three times a day (TID) | ORAL | 0 refills | Status: DC | PRN

## 2016-12-24 NOTE — Patient Instructions (Signed)
It was very nice to see you today!  I will look into the 24 hour urine test for cushing's for you Please increase your celexa to 40 mg; let me know how this works for you.  If you are not feeling better in the next 2-3 weeks please let me know- Sooner if worse.

## 2016-12-25 ENCOUNTER — Other Ambulatory Visit: Payer: Self-pay | Admitting: Emergency Medicine

## 2016-12-25 DIAGNOSIS — F325 Major depressive disorder, single episode, in full remission: Secondary | ICD-10-CM

## 2016-12-25 MED ORDER — DIAZEPAM 2 MG PO TABS: 2.0000 mg | ORAL_TABLET | Freq: Three times a day (TID) | ORAL | 0 refills | Status: DC | PRN

## 2016-12-25 NOTE — Telephone Encounter (Signed)
Requesting: diazepam (valium) 2 MG tablet  Contract:  UDS Last OV Last Refill  Please Advise

## 2016-12-27 ENCOUNTER — Encounter: Payer: Self-pay | Admitting: Family Medicine

## 2017-01-06 ENCOUNTER — Encounter: Payer: Self-pay | Admitting: Family Medicine

## 2017-02-25 ENCOUNTER — Other Ambulatory Visit: Payer: Self-pay | Admitting: Family Medicine

## 2017-02-25 DIAGNOSIS — K589 Irritable bowel syndrome without diarrhea: Secondary | ICD-10-CM

## 2017-02-27 NOTE — Telephone Encounter (Signed)
Rx sent to pharmacy. LB 

## 2017-04-29 ENCOUNTER — Telehealth: Payer: Self-pay | Admitting: Family Medicine

## 2017-04-29 ENCOUNTER — Encounter: Payer: Self-pay | Admitting: Family Medicine

## 2017-04-29 ENCOUNTER — Other Ambulatory Visit: Payer: Self-pay | Admitting: Family Medicine

## 2017-04-29 DIAGNOSIS — M545 Low back pain: Principal | ICD-10-CM

## 2017-04-29 DIAGNOSIS — G8929 Other chronic pain: Secondary | ICD-10-CM

## 2017-04-29 DIAGNOSIS — M5441 Lumbago with sciatica, right side: Principal | ICD-10-CM

## 2017-04-29 DIAGNOSIS — M5442 Lumbago with sciatica, left side: Principal | ICD-10-CM

## 2017-04-30 ENCOUNTER — Encounter: Payer: Self-pay | Admitting: Family Medicine

## 2017-04-30 MED ORDER — HYDROCODONE-ACETAMINOPHEN 5-325 MG PO TABS: 1.0000 | ORAL_TABLET | Freq: Four times a day (QID) | ORAL | 0 refills | Status: DC | PRN

## 2017-04-30 NOTE — Telephone Encounter (Signed)
Pt is requesting refill on Hydrocodone 5-325mg.  

## 2017-05-04 ENCOUNTER — Other Ambulatory Visit: Payer: Self-pay | Admitting: Emergency Medicine

## 2017-05-04 DIAGNOSIS — M545 Low back pain, unspecified: Secondary | ICD-10-CM

## 2017-05-04 DIAGNOSIS — G8929 Other chronic pain: Secondary | ICD-10-CM

## 2017-05-04 MED ORDER — CYCLOBENZAPRINE HCL 10 MG PO TABS: ORAL_TABLET | ORAL | 1 refills | Status: DC

## 2017-05-04 NOTE — Telephone Encounter (Signed)
Requesting: cyclobenzaprine (FLEXERIL) 10 MG tablet Contract: UDS:  Last OV : 12/24/16 Last Refill: 08/22/16  Please Advise

## 2017-07-03 ENCOUNTER — Other Ambulatory Visit: Payer: Self-pay | Admitting: Family Medicine

## 2017-07-03 DIAGNOSIS — M545 Low back pain, unspecified: Secondary | ICD-10-CM

## 2017-07-03 DIAGNOSIS — G8929 Other chronic pain: Secondary | ICD-10-CM

## 2017-07-06 NOTE — Telephone Encounter (Signed)
Received refill request for cyclobenzaprine (FLEXERIL) . Please advise.

## 2017-08-04 ENCOUNTER — Telehealth: Payer: Self-pay

## 2017-08-04 DIAGNOSIS — M545 Low back pain: Principal | ICD-10-CM

## 2017-08-04 DIAGNOSIS — G8929 Other chronic pain: Secondary | ICD-10-CM

## 2017-08-04 NOTE — Telephone Encounter (Signed)
Copied from CRM [id number]. Topic: Referral - Request >> Aug 04, 2017  3:14 PM Percival SpanishKennedy, Cheryl W wrote:  Pt call to say that he had discuss with Dr Patsy Lageropland about see someone for pain management. He call today to req the referral to pain management  [phone]

## 2017-08-04 NOTE — Telephone Encounter (Signed)
Please advise 

## 2017-08-30 DIAGNOSIS — E785 Hyperlipidemia, unspecified: Secondary | ICD-10-CM

## 2017-08-30 HISTORY — DX: Hyperlipidemia, unspecified: E78.5

## 2017-08-30 NOTE — Progress Notes (Signed)
[facility] [address] [place], [place] [address] [phone]  Date:  08/31/2017   Name:  Timothy Solis   DOB:  23-Dec-1965   MRN:  [id number]  PCP:  Timothy Cables, MD    Chief Complaint: Breast Problem (c/o black spots on left nipple that was noticed last Wednesday. )   History of Present Illness:  Timothy Solis is a 52 y.o. very pleasant male patient who presents with the following:  History of obesity, depression, hyperlipidemia Here today with concern of chest pain which he relates to chronic pain that he has had in the past  I last saw him in June Also referred him to pain management in January due to his back pain- however this referral did not go through for some reason  He notes that his overall body pain is getting worse.  However now instead of his back he notes the pain more in his chest.   This is worse if he does any sort of exercise.  He will also have SOB with any exerion  He relates that he did have a full cardiac evaluation including a cath when he first noted this issue, about 6-7 years ago at [facility] There are records in Care Everywhere from 2013- I can see a normal dobutamine stress from 06/27/12 but cannot find a cath report  EMS came to his house over the holidays as he was having this CP-Timothy Solis reports his EKG was ok per EMS He notes that his CP has been more constant and severe since Christmas time  He notes some pressure 'all the time," sometimes worse than others Any exertion makes it worse, but it can also occur at rest   He notes that his chronic fatigue syndrome often causes him to have chest pain "like I'm having a heart attack," and that this is a recurrent problem for him  Flu: will do today   He did get benefit from gabapentin in the past-will start this again for him  Pulse Readings from Last 3 Encounters:  08/31/17 (!) 102  12/24/16 84  11/10/16 96     Patient Active  Problem List   Diagnosis Date Noted  . Dyslipidemia 08/30/2017  . IBS (irritable bowel syndrome) 11/05/2015  . Morbid obesity (HCC) 11/05/2015  . Major depressive disorder with single episode, in remission (HCC) 11/05/2015  . Chronic fatigue 11/05/2015  . Allergic rhinitis, cause unspecified 04/01/2013  . Unspecified asthma(493.90) 04/01/2013  . Asthma exacerbation 03/10/2012    Past Medical History:  Diagnosis Date  . Allergy   . Chronic fatigue syndrome   . Depression   . GERD (gastroesophageal reflux disease)   . HTN (hypertension)   . IBS (irritable bowel syndrome)   . IBS (irritable bowel syndrome)   . Nephrolithiasis   . Obesity     Past Surgical History:  Procedure Laterality Date  . BACK SURGERY    . CHOLECYSTECTOMY    . KNEE SURGERY      Social History   Tobacco Use  . Smoking status: Former Games developer  . Smokeless tobacco: Never Used  Substance Use Topics  . Alcohol use: Not on file  . Drug use: Not on file    Family History  Problem Relation Age of Onset  . Coronary artery disease Unknown   . Cancer Unknown     Allergies  Allergen Reactions  . Ceclor [Cefaclor]     Can't  remember what reaction had  . Oxycodone Itching  . Prednisone     Medication list has been reviewed and updated.  Current Outpatient Medications on File Prior to Visit  Medication Sig Dispense Refill  . ALBUTEROL IN Inhale into the lungs. Reported on 11/05/2015    . colestipol (COLESTID) 1 g tablet Take 1 g by mouth as needed.    . cyclobenzaprine (FLEXERIL) 10 MG tablet TAKE ONE TABLET BY MOUTH ONCE DAILY AS NEEDED FOR  MUSCLE  SPASMS. Do no combine with other sedating medications 30 tablet 1  . cyclobenzaprine (FLEXERIL) 10 MG tablet TAKE ONE TABLET BY MOUTH ONCE DAILY AS NEEDED FOR MUSCLE SPASM 30 tablet 1  . diazepam (VALIUM) 2 MG tablet Take 1 tablet (2 mg total) by mouth every 8 (eight) hours as needed for anxiety. 20 tablet 0  . dicyclomine (BENTYL) 20 MG tablet TAKE ONE  TABLET BY MOUTH TWICE DAILY 180 tablet 3  . DiphenhydrAMINE HCl (BENADRYL ALLERGY PO) Take by mouth. Prn       . fexofenadine (ALLEGRA) 180 MG tablet Take 180 mg by mouth daily.    . fluticasone (FLONASE) 50 MCG/ACT nasal spray Place 2 sprays into the nose daily. 1 g 5  . lovastatin (MEVACOR) 20 MG tablet Take 1 tablet (20 mg total) by mouth daily. 90 tablet 3   No current facility-administered medications on file prior to visit.     Review of Systems:  As per HPI- otherwise negative.   Physical Examination: Vitals:   08/31/17 1216 08/31/17 2041  BP: (!) 142/92   Pulse: (!) 108 (!) 102  Temp: 98.3 F (36.8 C)   SpO2: 96%    Vitals:   08/31/17 1216  Weight: (!) 397 lb 6.4 oz (180.3 kg)  Height: 5' 10.5" (1.791 m)   Body mass index is 56.22 kg/m. Ideal Body Weight: Weight in (lb) to have BMI = 25: 176.4  GEN: WDWN, NAD, Non-toxic, A & O x 3, very obese, appears his normal self HEENT: Atraumatic, Normocephalic. Neck supple. No masses, No LAD.  Bilateral TM wnl, oropharynx normal.  PEERL,EOMI.   Ears and Nose: No external deformity. CV: RRR, No M/G/R. No JVD. No thrill. No extra heart sounds. PULM: CTA B, no wheezes, crackles, rhonchi. No retractions. No resp. distress. No accessory muscle use. ABD: S, NT, ND, +BS. No rebound. No HSM. EXTR: No c/c/e NEURO Normal gait for pt, he does use a cane PSYCH: Normally interactive. Conversant. Not depressed or anxious appearing.  Calm demeanor.  I am able to easily reproduce his chest pain by palpating over his costochondral joints  Dg Chest 2 View  Result Date: 08/31/2017 CLINICAL DATA:  Left-sided chest pain for months EXAM: CHEST  2 VIEW COMPARISON:  03/10/2012 FINDINGS: The heart size and mediastinal contours are within normal limits. Both lungs are clear. The visualized skeletal structures are unremarkable. IMPRESSION: No active cardiopulmonary disease. Electronically Signed   By: Alcide CleverMark  Lukens M.D.   On: 08/31/2017 13:11   EKG:  mild sinus tach, left axis  Called and discussed case with DOD at cardiology.  Timothy Solis describes concerning sx of typical CP- however, he has had this in the past and underwent a complete cardiac eval (including a cath per his report) which was negative.  Also, his pain is strongly reproducible in clinic, and it has been present for over a month now with no apparent ill effects.  Plan to obtain a stat troponin for him.  Assuming this is negative  will arrange for a stress myoview  Stress myoview Troponin I  Assessment and Plan: Exertional chest pain - Plan: EKG 12-Lead, DG Chest 2 View, gabapentin (NEURONTIN) 300 MG capsule, HYDROcodone-acetaminophen (NORCO/VICODIN) 5-325 MG tablet, Comprehensive metabolic panel, Troponin I, CBC, Myocardial Perfusion Imaging  Dyslipidemia  Major depressive disorder with single episode, in remission (HCC) - Plan: citalopram (CELEXA) 40 MG tablet  Chronic midline low back pain with bilateral sciatica - Plan: HYDROcodone-acetaminophen (NORCO/VICODIN) 5-325 MG tablet  Immunization due - Plan: Flu Vaccine QUAD 36+ mos IM (Fluarix & Fluzone Quad PF  Here today with chest pain for a month- worse on exertion but reproducible on exam.  EKG today is non- acute Explained plan to check troponin and then pursue stress test.  Also offered to have him seen in the ER for cardiac enzymes and perhaps further evaluation now.  As his sx have been present for a month now he prefers to do troponin and then outpt stress  Pt feels like the pain he is experiencing is a subset of his chronic pain.  He has gotten benefit from gabapentin in the past.  Will start him on 300 mg and have him taper up to TID dosing Discussed having him see PM and R for likely MSK chest pain.  He would prefer to see how he responds to the gabapentin prior to referral   Signed Abbe Amsterdam, MD  Results for orders placed or performed in visit on 08/31/17  Comprehensive metabolic panel  Result Value Ref  Range   Sodium 140 135 - 145 mEq/L   Potassium 4.2 3.5 - 5.1 mEq/L   Chloride 103 96 - 112 mEq/L   CO2 29 19 - 32 mEq/L   Glucose, Bld 137 (H) 70 - 99 mg/dL   BUN 16 6 - 23 mg/dL   Creatinine, Ser 4.09 0.40 - 1.50 mg/dL   Total Bilirubin 0.3 0.2 - 1.2 mg/dL   Alkaline Phosphatase 94 39 - 117 U/L   AST 21 0 - 37 U/L   ALT 22 0 - 53 U/L   Total Protein 7.5 6.0 - 8.3 g/dL   Albumin 4.1 3.5 - 5.2 g/dL   Calcium 9.6 8.4 - 81.1 mg/dL   GFR 91.47 >82.95 mL/min  Troponin I  Result Value Ref Range   Troponin I <0.01 < OR = 0.0 ng/mL  CBC  Result Value Ref Range   WBC 7.8 4.0 - 10.5 K/uL   RBC 5.25 4.22 - 5.81 Mil/uL   Platelets 276.0 150.0 - 400.0 K/uL   Hemoglobin 14.9 13.0 - 17.0 g/dL   HCT 62.1 30.8 - 65.7 %   MCV 83.7 78.0 - 100.0 fl   MCHC 33.9 30.0 - 36.0 g/dL   RDW 84.6 96.2 - 95.2 %    Received his labs and sent a mychart message that his troponin is negative

## 2017-08-31 ENCOUNTER — Encounter: Payer: Self-pay | Admitting: Family Medicine

## 2017-08-31 ENCOUNTER — Ambulatory Visit: Payer: Self-pay | Admitting: Family Medicine

## 2017-08-31 ENCOUNTER — Ambulatory Visit (INDEPENDENT_AMBULATORY_CARE_PROVIDER_SITE_OTHER): Payer: Medicare Other | Admitting: Family Medicine

## 2017-08-31 ENCOUNTER — Ambulatory Visit (HOSPITAL_BASED_OUTPATIENT_CLINIC_OR_DEPARTMENT_OTHER)
Admission: RE | Admit: 2017-08-31 | Discharge: 2017-08-31 | Disposition: A | Discharge status: Home / Self Care | Payer: Medicare Other | Source: Ambulatory Visit | Attending: Family Medicine | Admitting: Family Medicine

## 2017-08-31 VITALS — BP 142/92 | HR 102 | Temp 98.3°F | Ht 70.5 in | Wt 397.4 lb

## 2017-08-31 DIAGNOSIS — M5441 Lumbago with sciatica, right side: Secondary | ICD-10-CM | POA: Diagnosis not present

## 2017-08-31 DIAGNOSIS — F325 Major depressive disorder, single episode, in full remission: Secondary | ICD-10-CM

## 2017-08-31 DIAGNOSIS — E785 Hyperlipidemia, unspecified: Secondary | ICD-10-CM | POA: Diagnosis not present

## 2017-08-31 DIAGNOSIS — G8929 Other chronic pain: Secondary | ICD-10-CM | POA: Diagnosis not present

## 2017-08-31 DIAGNOSIS — M5442 Lumbago with sciatica, left side: Secondary | ICD-10-CM | POA: Diagnosis not present

## 2017-08-31 DIAGNOSIS — Z23 Encounter for immunization: Secondary | ICD-10-CM | POA: Diagnosis not present

## 2017-08-31 DIAGNOSIS — R079 Chest pain, unspecified: Secondary | ICD-10-CM

## 2017-08-31 LAB — COMPREHENSIVE METABOLIC PANEL
ALBUMIN: 4.1 g/dL (ref 3.5–5.2)
ALT: 22 U/L (ref 0–53)
AST: 21 U/L (ref 0–37)
Alkaline Phosphatase: 94 U/L (ref 39–117)
BUN: 16 mg/dL (ref 6–23)
CHLORIDE: 103 meq/L (ref 96–112)
CO2: 29 mEq/L (ref 19–32)
CREATININE: 1.02 mg/dL (ref 0.40–1.50)
Calcium: 9.6 mg/dL (ref 8.4–10.5)
GFR: 82.32 mL/min (ref >60.00)
Glucose, Bld: 137 mg/dL — ABNORMAL HIGH (ref 70–99)
Potassium: 4.2 mEq/L (ref 3.5–5.1)
SODIUM: 140 meq/L (ref 135–145)
TOTAL PROTEIN: 7.5 g/dL (ref 6.0–8.3)
Total Bilirubin: 0.3 mg/dL (ref 0.2–1.2)

## 2017-08-31 LAB — CBC
HEMATOCRIT: 43.9 % (ref 39.0–52.0)
HEMOGLOBIN: 14.9 g/dL (ref 13.0–17.0)
MCHC: 33.9 g/dL (ref 30.0–36.0)
MCV: 83.7 fl (ref 78.0–100.0)
Platelets: 276 10*3/uL (ref 150.0–400.0)
RBC: 5.25 Mil/uL (ref 4.22–5.81)
RDW: 13.9 % (ref 11.5–15.5)
WBC: 7.8 10*3/uL (ref 4.0–10.5)

## 2017-08-31 LAB — TROPONIN I

## 2017-08-31 MED ORDER — HYDROCODONE-ACETAMINOPHEN 5-325 MG PO TABS
1.0000 | ORAL_TABLET | Freq: Four times a day (QID) | ORAL | 0 refills | Status: DC | PRN
Start: 2017-08-31 — End: 2018-02-15

## 2017-08-31 MED ORDER — CITALOPRAM HYDROBROMIDE 40 MG PO TABS: 40.0000 mg | ORAL_TABLET | Freq: Every day | ORAL | 3 refills | Status: DC

## 2017-08-31 MED ORDER — GABAPENTIN 300 MG PO CAPS: 300.0000 mg | ORAL_CAPSULE | Freq: Three times a day (TID) | ORAL | 6 refills | Status: DC

## 2017-08-31 NOTE — Patient Instructions (Signed)
We are going to get a stat troponin today to look for any sign of cardiac distress. Assuming negative I will set you up for a stress test of your heart Go ahead and re-start gabapentin take 1 today, twice tomorrow, then go to three times a day

## 2017-09-01 ENCOUNTER — Encounter: Payer: Self-pay | Admitting: Physical Medicine & Rehabilitation

## 2017-09-01 ENCOUNTER — Encounter: Payer: Self-pay | Admitting: Family Medicine

## 2017-09-01 NOTE — Addendum Note (Signed)
Addended by: Abbe AmsterdamOPLAND, Maela Takeda C on: 09/01/2017 08:11 AM   Modules accepted: Orders

## 2017-09-01 NOTE — Addendum Note (Signed)
Addended by: Abbe AmsterdamOPLAND, JESSICA C on: 09/01/2017 08:04 AM   Modules accepted: Orders

## 2017-09-02 ENCOUNTER — Encounter: Payer: Self-pay | Admitting: Family Medicine

## 2017-09-04 ENCOUNTER — Telehealth (HOSPITAL_COMMUNITY): Payer: Self-pay | Admitting: Radiology

## 2017-09-04 ENCOUNTER — Telehealth (HOSPITAL_COMMUNITY): Payer: Self-pay | Admitting: *Deleted

## 2017-09-04 NOTE — Telephone Encounter (Signed)
Left message on voicemail in reference to upcoming appointment scheduled for 09/07/17. Phone number given for a call back so details instructions can be given. Timothy Solis

## 2017-09-04 NOTE — Telephone Encounter (Signed)
Patient given detailed instructions per Myocardial Perfusion Study Information Sheet for the test on 09/07/2017 at 12:45. Patient notified to arrive 15 minutes early and that it is imperative to arrive on time for appointment to keep from having the test rescheduled.  If you need to cancel or reschedule your appointment, please call the office within 24 hours of your appointment. . Patient verbalized understanding.EHK

## 2017-09-07 ENCOUNTER — Ambulatory Visit (HOSPITAL_COMMUNITY): Payer: Medicare Other | Attending: Cardiology

## 2017-09-07 DIAGNOSIS — I10 Essential (primary) hypertension: Secondary | ICD-10-CM | POA: Insufficient documentation

## 2017-09-07 DIAGNOSIS — R079 Chest pain, unspecified: Secondary | ICD-10-CM | POA: Insufficient documentation

## 2017-09-07 DIAGNOSIS — I2510 Atherosclerotic heart disease of native coronary artery without angina pectoris: Secondary | ICD-10-CM | POA: Insufficient documentation

## 2017-09-07 DIAGNOSIS — R0609 Other forms of dyspnea: Secondary | ICD-10-CM | POA: Insufficient documentation

## 2017-09-07 DIAGNOSIS — R55 Syncope and collapse: Secondary | ICD-10-CM | POA: Insufficient documentation

## 2017-09-07 MED ORDER — TECHNETIUM TC 99M TETROFOSMIN IV KIT
32.4000 | PACK | Freq: Once | INTRAVENOUS | Status: AC | PRN
  Administered 2017-09-07: 32.4 via INTRAVENOUS
  Filled 2017-09-07: qty 33

## 2017-09-07 MED ORDER — REGADENOSON 0.4 MG/5ML IV SOLN
0.4000 mg | Freq: Once | INTRAVENOUS | Status: AC
  Administered 2017-09-07: 0.4 mg via INTRAVENOUS

## 2017-09-08 ENCOUNTER — Encounter: Payer: Self-pay | Admitting: Family Medicine

## 2017-09-08 ENCOUNTER — Ambulatory Visit (HOSPITAL_COMMUNITY): Payer: Medicare Other | Attending: Cardiology

## 2017-09-08 LAB — MYOCARDIAL PERFUSION IMAGING
CHL CUP NUCLEAR SSS: 11
LVDIAVOL: 98 mL (ref 62–150)
LVSYSVOL: 39 mL
Peak HR: 108 {beats}/min
RATE: 0.36
Rest HR: 89 {beats}/min
SDS: 3
SRS: 8
TID: 0.96

## 2017-09-08 MED ORDER — TECHNETIUM TC 99M TETROFOSMIN IV KIT
31.6000 | PACK | Freq: Once | INTRAVENOUS | Status: AC | PRN
  Administered 2017-09-08: 31.6 via INTRAVENOUS
  Filled 2017-09-08: qty 32

## 2017-09-14 ENCOUNTER — Ambulatory Visit (HOSPITAL_BASED_OUTPATIENT_CLINIC_OR_DEPARTMENT_OTHER): Payer: Medicare Other | Admitting: Physical Medicine & Rehabilitation

## 2017-09-14 ENCOUNTER — Encounter: Payer: Medicare Other | Attending: Physical Medicine & Rehabilitation

## 2017-09-14 ENCOUNTER — Encounter: Payer: Self-pay | Admitting: Physical Medicine & Rehabilitation

## 2017-09-14 VITALS — BP 152/109 | HR 90 | Resp 14

## 2017-09-14 DIAGNOSIS — G8929 Other chronic pain: Secondary | ICD-10-CM | POA: Insufficient documentation

## 2017-09-14 DIAGNOSIS — M961 Postlaminectomy syndrome, not elsewhere classified: Secondary | ICD-10-CM | POA: Diagnosis not present

## 2017-09-14 DIAGNOSIS — R0789 Other chest pain: Secondary | ICD-10-CM | POA: Diagnosis not present

## 2017-09-14 DIAGNOSIS — M940 Chondrocostal junction syndrome [Tietze]: Secondary | ICD-10-CM | POA: Insufficient documentation

## 2017-09-14 DIAGNOSIS — M545 Low back pain: Secondary | ICD-10-CM | POA: Diagnosis not present

## 2017-09-14 HISTORY — DX: Chondrocostal junction syndrome (tietze): M94.0

## 2017-09-14 NOTE — Progress Notes (Signed)
Subjective:    Patient ID: Timothy Solis, male    DOB: February 16, 1966, 52 y.o.   MRN: [id number]  HPI  Reason fo rreferral: Low back pain with LLE pain is episodic had discogram in 2007 which showed annular tear L4-5 Was able to return to work in 2009 and worked until 2013.Worked in IT as Airline pilot rep CT of the lumbar spine 01/31/2015 demonstrated L4 to-L5 mild disc bulge with facet hypertrophy there is nerve root encroachment left L4 no compression. Recommendations were physical therapy plus minus lumbar epidural steroid injection  He has been able to manage his low back pain with stretching that he learned with physical therapy.  There are only a few times in a month that he feels he cannot get his pain under adequate control using stretching.  It is at these times that he will take a 5 mg dose of hydrocodone.  Typically 20 tablets last him about 3 months.  He states that his primary care physician has been prescribing this.  Reviewed PMP aware website and this appears to be a case   More recently chest pain has been chief complaint but stress test is negative, prior cardiac cath which she reports at [facility] around 2013, looking back through his records there was a ED visit at [facility] with recommendation for cardiology follow-up.  Gets symptoms mainly with activity, no shortness of breath no radiation of pain down the arm no diaphoresis.   Has daily diffuse body pain that has been cut in half with gabapentin 300mg  TID  Has not been able to work in [employer] recently when his chest pain flared up  No significant bladder dysfunction  Has hx of IBS, has chronic diarrhea  Starting process for gastric bypass  May 2013, had pneumonia with a short hospitalization with poor energy ever since Had w/u at [facility], including cardiac cath which was reportedly neg, diagnosed with CFS  Is on lovastatin for only 3 mo   Pain Inventory Average Pain 8 Pain Right Now 6 My pain  is intermittent, constant, sharp, burning, tingling and aching  In the last 24 hours, has pain interfered with the following? General activity 10 Relation with others 10 Enjoyment of life 10 What TIME of day is your pain at its worst? daytime Sleep (in general) Fair  Pain is worse with: walking, bending, standing and some activites Pain improves with: therapy/exercise and medication Relief from Meds: 5  Mobility walk with assistance use a cane how many minutes can you walk? 5 ability to climb steps?  yes do you drive?  yes Do you have any goals in this area?  no  Function disabled: date disabled . I need assistance with the following:  household duties and shopping  Neuro/Psych weakness numbness tremor tingling trouble walking spasms dizziness depression anxiety suicidal thoughts  Prior Studies new visit  Physicians involved in your care new visit   Family History  Problem Relation Age of Onset  . Coronary artery disease Unknown   . Cancer Unknown    Social History   Socioeconomic History  . Marital status: Divorced    Spouse name: None  . Number of children: None  . Years of education: None  . Highest education level: None  Social Needs  . Financial resource strain: None  . Food insecurity - worry: None  . Food insecurity - inability: None  . Transportation needs - medical: None  . Transportation needs - non-medical: None  Occupational  History  . None  Tobacco Use  . Smoking status: Former Games developer  . Smokeless tobacco: Never Used  Substance and Sexual Activity  . Alcohol use: None  . Drug use: None  . Sexual activity: None  Other Topics Concern  . None  Social History Narrative  . None   Past Surgical History:  Procedure Laterality Date  . BACK SURGERY    . CHOLECYSTECTOMY    . KNEE SURGERY     Past Medical History:  Diagnosis Date  . Allergy   . Chronic fatigue syndrome   . Depression   . GERD (gastroesophageal reflux disease)     . HTN (hypertension)   . IBS (irritable bowel syndrome)   . IBS (irritable bowel syndrome)   . Nephrolithiasis   . Obesity    BP (!) 152/109 (BP Location: Right Wrist, Patient Position: Sitting, Cuff Size: Normal)   Pulse 90   Resp 14   SpO2 93%   Opioid Risk Score:   Fall Risk Score:  `1  Depression screen PHQ 2/9  Depression screen [facility] 2/9 09/14/2017 05/07/2016  Decreased Interest 0 0  Down, Depressed, Hopeless 0 0  PHQ - 2 Score 0 0  Altered sleeping 3 -  Tired, decreased energy 3 -  Change in appetite 3 -  Feeling bad or failure about yourself  0 -  Trouble concentrating 1 -  Moving slowly or fidgety/restless 0 -  Suicidal thoughts 0 -  PHQ-9 Score 10 -  Difficult doing work/chores Extremely dIfficult -    Review of Systems  Constitutional: Positive for fatigue and unexpected weight change.  HENT: Negative.   Eyes: Negative.   Respiratory: Negative.   Cardiovascular: Negative.   Gastrointestinal: Positive for abdominal pain, diarrhea and nausea.  Endocrine: Negative.   Musculoskeletal: Positive for back pain, gait problem and myalgias.       Spasms  Skin: Negative.   Allergic/Immunologic: Negative.   Neurological: Positive for dizziness, tremors, weakness and numbness.       Tingling  Psychiatric/Behavioral: Positive for dysphoric mood and suicidal ideas. The patient is nervous/anxious.        Objective:   Physical Exam  Constitutional: He is oriented to person, place, and time. He appears well-developed and well-nourished. No distress.  HENT:  Head: Normocephalic and atraumatic.  Eyes: Conjunctivae and EOM are normal. Pupils are equal, round, and reactive to light.  Neck: Normal range of motion.  Cardiovascular: Normal rate, regular rhythm and normal heart sounds. Exam reveals no friction rub.  No murmur heard. Pulmonary/Chest: Effort normal and breath sounds normal. No respiratory distress. He has no wheezes.  Abdominal: Soft. Bowel sounds are normal.  He exhibits no distension.  Musculoskeletal:  Negative straight leg raising  Knee has no evidence of effusion no medial or lateral joint line tenderness no patellar tenderness No pain with ambulation in the back or hip area there is no evidence of toe drag or knee instability There is mild tenderness palpation lumbar paraspinal muscles as well as right side sternocostal junction at T4 and at T7  Neurological: He is alert and oriented to person, place, and time. No sensory deficit.  5/5 strength bilateral deltoid, bicep, tricep, grip, hip flexor, knee extensor, ankle dorsiflexor Sensation intact to light touch as well as pinprick bilateral upper and lower limbs   Skin: Skin is warm and dry. He is not diaphoretic.  Psychiatric: He has a normal mood and affect. His behavior is normal. Judgment and thought content normal.  Nursing note and vitals reviewed.         Assessment & Plan:  1.  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  He has intermittent radicular complaints but these are fairly short-lived and likely involve the left L4 nerve root.  He is managing this pain well with his physical therapy exercises.  He is quite satisfied with his intermittent use of hydrocodone. His opioid risk score is high based on family history.  I would not go beyond 5 mg of hydrocodone on a daily basis.  His actual usage pattern is approximately 6 tablets/month.  2.  Costochondritis right side.  He has had negative stress test.  He does have tenderness palpation at 2 discrete areas along the sternal costal junction on the right side.  Recommend pectoralis stretching.  I demonstrated corner stretches.  This would be followed by Thera-Band rowing exercises and this was demonstrated to the patient as well.  Also gentle massage to these areas with icy hot or similar cream would be helpful on a daily basis. Physical medicine rehabilitation  follow-up in 6 weeks if not much better in terms of his costochondritis I would make referral to physical therapy as well as do trigger point injections. If the character or location of his chest pain changes would rec cardiology follow up

## 2017-09-14 NOTE — Patient Instructions (Signed)
Corner Stretches for Pectoralis x 3 reps, (may progress to swimmers strech if you become more flexible  Rowing exercise with Theraband 12-15 rep  3 sets  May go up to 4 times a day with gabapentin  If no significant improvement in 6 wk would do injections followed by formal PT

## 2017-10-16 ENCOUNTER — Encounter: Payer: Self-pay | Admitting: Family Medicine

## 2017-10-16 DIAGNOSIS — F325 Major depressive disorder, single episode, in full remission: Secondary | ICD-10-CM

## 2017-10-19 MED ORDER — BUPROPION HCL ER (SR) 100 MG PO TB12: 100.0000 mg | ORAL_TABLET | Freq: Two times a day (BID) | ORAL | 6 refills | Status: DC

## 2017-10-19 MED ORDER — DIAZEPAM 2 MG PO TABS: 2.0000 mg | ORAL_TABLET | Freq: Three times a day (TID) | ORAL | 0 refills | Status: DC | PRN

## 2017-10-19 NOTE — Telephone Encounter (Signed)
Called him on the phone. He has felt more down and has not had much energy for a couple of weeks. He cannot get interested in doing anything around the house, does not feel like being social  Denies any SI, but wants to try and feel better  He does NOT use gabapentin for seizure- he uses it for body pains and it does seem to be helping He is not having to use hydrocodone, occasionally uses the flexeril  No history of seizure disorder He also needs a refill of his valium which he uses on very rare occasion NCCSR:

## 2017-10-26 ENCOUNTER — Ambulatory Visit: Payer: Medicare Other | Admitting: Physical Medicine & Rehabilitation

## 2017-11-02 NOTE — Progress Notes (Addendum)
Subjective:   Wyline MoodCorwin E Solis is a 52 y.o. male who presents for an Initial Medicare Annual Wellness Visit.  The Patient was informed that the wellness visit is to identify future health risk and educate and initiate measures that can reduce risk for increased disease through the lifespan.   Describes health as fair, good or great? Good  PATIENT CONCERNS: Pt states he feels like he may have strained his achilles tendon. Reports he has been using rest, ice, and elevation, and that it has significantly improved. He doesn't feel like he needs to see a PCP today, but states he will sched appt if it gets worse.  Review of Systems  No ROS.  Medicare Wellness Visit. Additional risk factors are reflected in the social history.  Cardiac Risk Factors include: advanced age (>3555men, 60>65 women);dyslipidemia;obesity (BMI >30kg/m2);sedentary lifestyle;male gender;smoking/ tobacco exposure Sleep patterns: 8-10 hrs. Naps 1 hr daily  Home Safety/Smoke Alarms: Feels safe in home. Smoke alarms in place.  Living environment; residence and Firearm Safety: 1 story home. Lives with roommate. Seat Belt Safety/Bike Helmet: Wears seat belt.  Male:   CCS- na    PSA- No results found for: PSA     Objective:    Today's Vitals   11/05/17 1523  BP: (!) 152/98  Pulse: 94  SpO2: 96%  Weight: (!) 403 lb (182.8 kg)  Height: 5\' 10"  (1.778 m)  PainSc: 3   **PCP notified of BP. No new orders at this time. Body mass index is 57.82 kg/m.  Advanced Directives 11/05/2017  Does Patient Have a Medical Advance Directive? No  Would patient like information on creating a medical advance directive? Yes (MAU/Ambulatory/Procedural Areas - Information given)    Current Medications (verified) Outpatient Encounter Medications as of 11/05/2017  Medication Sig  . ALBUTEROL IN Inhale into the lungs. Reported on 11/05/2015  . buPROPion (WELLBUTRIN SR) 100 MG 12 hr tablet Take 1 tablet (100 mg total) by mouth 2 (two) times  daily. Take one daily for the first 3 days, then go to BID  . citalopram (CELEXA) 40 MG tablet Take 1 tablet (40 mg total) by mouth daily.  . cyclobenzaprine (FLEXERIL) 10 MG tablet TAKE ONE TABLET BY MOUTH ONCE DAILY AS NEEDED FOR MUSCLE SPASM  . diazepam (VALIUM) 2 MG tablet Take 1 tablet (2 mg total) by mouth every 8 (eight) hours as needed for anxiety.  . dicyclomine (BENTYL) 20 MG tablet TAKE ONE TABLET BY MOUTH TWICE DAILY  . DiphenhydrAMINE HCl (BENADRYL ALLERGY PO) Take by mouth. Prn     . fexofenadine (ALLEGRA) 180 MG tablet Take 180 mg by mouth daily.  . fluticasone (FLONASE) 50 MCG/ACT nasal spray Place 2 sprays into the nose daily.  Marland Kitchen. gabapentin (NEURONTIN) 300 MG capsule Take 1 capsule (300 mg total) by mouth 3 (three) times daily.  Marland Kitchen. HYDROcodone-acetaminophen (NORCO/VICODIN) 5-325 MG tablet Take 1 tablet by mouth every 6 (six) hours as needed for moderate pain.  Marland Kitchen. lovastatin (MEVACOR) 20 MG tablet Take 1 tablet (20 mg total) by mouth daily.  . colestipol (COLESTID) 1 g tablet Take 1 g by mouth as needed.   No facility-administered encounter medications on file as of 11/05/2017.     Allergies (verified) Ceclor [cefaclor]; Oxycodone; and Prednisone   History: Past Medical History:  Diagnosis Date  . Allergy   . Chronic fatigue syndrome   . Depression   . GERD (gastroesophageal reflux disease)   . HTN (hypertension)   . IBS (irritable bowel syndrome)   .  IBS (irritable bowel syndrome)   . Nephrolithiasis   . Obesity    Past Surgical History:  Procedure Laterality Date  . BACK SURGERY    . CHOLECYSTECTOMY    . KNEE SURGERY     Family History  Problem Relation Age of Onset  . Coronary artery disease Unknown   . Cancer Unknown   . Heart disease Mother   . Multiple sclerosis Father   . Cancer Brother   . Multiple sclerosis Brother   . Heart disease Maternal Grandmother   . Cancer Maternal Grandfather   . Multiple sclerosis Brother    Social History    Socioeconomic History  . Marital status: Divorced    Spouse name: Not on file  . Number of children: Not on file  . Years of education: Not on file  . Highest education level: Not on file  Occupational History  . Not on file  Social Needs  . Financial resource strain: Not on file  . Food insecurity:    Worry: Not on file    Inability: Not on file  . Transportation needs:    Medical: Not on file    Non-medical: Not on file  Tobacco Use  . Smoking status: Current Some Day Smoker    Types: Cigars  . Smokeless tobacco: Never Used  Substance and Sexual Activity  . Alcohol use: Yes    Comment: occasional  . Drug use: Never  . Sexual activity: Not Currently  Lifestyle  . Physical activity:    Days per week: Not on file    Minutes per session: Not on file  . Stress: Not on file  Relationships  . Social connections:    Talks on phone: Not on file    Gets together: Not on file    Attends religious service: Not on file    Active member of club or organization: Not on file    Attends meetings of clubs or organizations: Not on file    Relationship status: Not on file  Other Topics Concern  . Not on file  Social History Narrative  . Not on file   Tobacco Counseling Ready to quit: Not Answered Counseling given: Not Answered   Clinical Intake:     Pain : 0-10(Pt states pain is improving, but if gets worse, will schedule appt.) Pain Score: 3  Pain Location: ("achilles tendon") Pain Orientation: Right Pain Descriptors / Indicators: Sore Pain Onset: In the past 7 days Pain Frequency: Rarely Pain Relieving Factors: elevation, ice, rest  Pain Relieving Factors: elevation, ice, rest   Activities of Daily Living In your present state of health, do you have any difficulty performing the following activities: 11/05/2017  Hearing? N  Vision? N  Comment wears glasses at all times.  [facility] doctor as needed.  Difficulty concentrating or making decisions? N  Walking or  climbing stairs? Y  Comment SOB with exertion  Dressing or bathing? N  Doing errands, shopping? N  Preparing Food and eating ? N  Using the Toilet? N  In the past six months, have you accidently leaked urine? N  Do you have problems with loss of bowel control? N  Managing your Medications? N  Managing your Finances? N  Housekeeping or managing your Housekeeping? N  Some recent data might be hidden     Immunizations and Health Maintenance Immunization History  Administered Date(s) Administered  . Influenza,inj,Quad PF,6+ Mos 05/07/2016, 08/31/2017  . Tdap 11/05/2015   Health Maintenance Due  Topic Date  Due  . HIV Screening  02/12/1983    Patient Care Team: Copland, Gwenlyn Found, MD as PCP - General (Family Medicine)  Indicate any recent Medical Services you may have received from other than [facility] providers in the past year (date may be approximate).    Assessment:   This is a routine wellness examination for Timothy Solis. Physical assessment deferred to PCP.   Hearing/Vision screen  Visual Acuity Screening   Right eye Left eye Both eyes  Without correction:     With correction: 20/20 20/20 20/20   Hearing Screening Comments: Able to hear conversational tones w/o difficulty. No issues reported.    Dietary issues and exercise activities discussed: Current Exercise Habits: The patient does not participate in regular exercise at present, Exercise limited by: Other - see comments(chronic fatigue and depression) Diet (meal preparation, eat out, water intake, caffeinated beverages, dairy products, fruits and vegetables): Eats a lot of delivery or carry out and struggles with portion control.   Goals    . Have bariatric surgery      Depression Screen PHQ 2/9 Scores 11/05/2017 09/14/2017 05/07/2016  PHQ - 2 Score 0 0 0  PHQ- 9 Score - 10 -    Fall Risk Fall Risk  11/05/2017 09/14/2017 05/07/2016  Falls in the past year? Yes Yes Yes  Comment - last fall november 2018 -  Number falls  in past yr: - 2 or more 2 or more  Injury with Fall? - - No  Risk for fall due to : - Impaired balance/gait;Mental status change -    Cognitive Function: Ad8 score reviewed for issues:  Issues making decisions:no  Less interest in hobbies / activities:no  Repeats questions, stories (family complaining):no  Trouble using ordinary gadgets (microwave, computer, phone):no  Forgets the month or year: no  Mismanaging finances: no  Remembering appts:no  Daily problems with thinking and/or memory:no Ad8 score is=0         Screening Tests Health Maintenance  Topic Date Due  . HIV Screening  02/12/1983  . INFLUENZA VACCINE  02/18/2018  . TETANUS/TDAP  11/04/2025      Plan:   Follows up with PCP as directed.   Eat heart healthy diet (full of fruits, vegetables, whole grains, lean protein, water--limit salt, fat, and sugar intake) and increase physical activity as tolerated.  Continue doing brain stimulating activities (puzzles, reading, adult coloring books, staying active) to keep memory sharp.   Bring a copy of your living will and/or healthcare power of attorney to your next office visit.  I have personally reviewed and noted the following in the patient's chart:   . Medical and social history . Use of alcohol, tobacco or illicit drugs  . Current medications and supplements . Functional ability and status . Nutritional status . Physical activity . Advanced directives . List of other physicians . Hospitalizations, surgeries, and ER visits in previous 12 months . Vitals . Screenings to include cognitive, depression, and falls . Referrals and appointments  In addition, I have reviewed and discussed with patient certain preventive protocols, quality metrics, and best practice recommendations. A written personalized care plan for preventive services as well as general preventive health recommendations were provided to patient.     Avon Gully,  RN   11/05/2017    I have reviewed the above MWE note by Ms. Syris Brookens and agree with her documentation Also, there is concern about Corbins' BP- will send a message to him  BP Readings from Last 3 Encounters:  11/05/17 (!) 152/98  09/14/17 (!) 152/109  08/31/17 (!) 142/92      

## 2017-11-05 ENCOUNTER — Encounter: Payer: Self-pay | Admitting: *Deleted

## 2017-11-05 ENCOUNTER — Ambulatory Visit (INDEPENDENT_AMBULATORY_CARE_PROVIDER_SITE_OTHER): Payer: Medicare Other | Admitting: *Deleted

## 2017-11-05 VITALS — BP 152/98 | HR 94 | Ht 70.0 in | Wt >= 6400 oz

## 2017-11-05 DIAGNOSIS — Z0000 Encounter for general adult medical examination without abnormal findings: Secondary | ICD-10-CM | POA: Diagnosis not present

## 2017-11-05 NOTE — Patient Instructions (Signed)
Follows up with PCP as directed.   Eat heart healthy diet (full of fruits, vegetables, whole grains, lean protein, water--limit salt, fat, and sugar intake) and increase physical activity as tolerated.  Continue doing brain stimulating activities (puzzles, reading, adult coloring books, staying active) to keep memory sharp.   Bring a copy of your living will and/or healthcare power of attorney to your next office visit.   Timothy Solis , Thank you for taking time to come for your Medicare Wellness Visit. I appreciate your ongoing commitment to your health goals. Please review the following plan we discussed and let me know if I can assist you in the future.   These are the goals we discussed: Goals    . Have bariatric surgery       This is a list of the screening recommended for you and due dates:  Health Maintenance  Topic Date Due  . HIV Screening  02/12/1983  . Flu Shot  02/18/2018  . Tetanus Vaccine  11/04/2025    Health Maintenance, Male A healthy lifestyle and preventive care is important for your health and wellness. Ask your health care provider about what schedule of regular examinations is right for you. What should I know about weight and diet? Eat a Healthy Diet  Eat plenty of vegetables, fruits, whole grains, low-fat dairy products, and lean protein.  Do not eat a lot of foods high in solid fats, added sugars, or salt.  Maintain a Healthy Weight Regular exercise can help you achieve or maintain a healthy weight. You should:  Do at least 150 minutes of exercise each week. The exercise should increase your heart rate and make you sweat (moderate-intensity exercise).  Do strength-training exercises at least twice a week.  Watch Your Levels of Cholesterol and Blood Lipids  Have your blood tested for lipids and cholesterol every 5 years starting at 50 years of age. If you are at high risk for heart disease, you should start having your blood tested when you are 50  years old. You may need to have your cholesterol levels checked more often if: ? Your lipid or cholesterol levels are high. ? You are older than 50 years of age. ? You are at high risk for heart disease.  What should I know about cancer screening? Many types of cancers can be detected early and may often be prevented. Lung Cancer  You should be screened every year for lung cancer if: ? You are a current smoker who has smoked for at least 30 years. ? You are a former smoker who has quit within the past 15 years.  Talk to your health care provider about your screening options, when you should start screening, and how often you should be screened.  Colorectal Cancer  Routine colorectal cancer screening usually begins at 50 years of age and should be repeated every 5-10 years until you are 50 years old. You may need to be screened more often if early forms of precancerous polyps or small growths are found. Your health care provider may recommend screening at an earlier age if you have risk factors for colon cancer.  Your health care provider may recommend using home test kits to check for hidden blood in the stool.  A small camera at the end of a tube can be used to examine your colon (sigmoidoscopy or colonoscopy). This checks for the earliest forms of colorectal cancer.  Prostate and Testicular Cancer  Depending on your age and overall health, your  health care provider may do certain tests to screen for prostate and testicular cancer.  Talk to your health care provider about any symptoms or concerns you have about testicular or prostate cancer.  Skin Cancer  Check your skin from head to toe regularly.  Tell your health care provider about any new moles or changes in moles, especially if: ? There is a change in a mole's size, shape, or color. ? You have a mole that is larger than a pencil eraser.  Always use sunscreen. Apply sunscreen liberally and repeat throughout the  day.  Protect yourself by wearing long sleeves, pants, a wide-brimmed hat, and sunglasses when outside.  What should I know about heart disease, diabetes, and high blood pressure?  If you are 1-52 years of age, have your blood pressure checked every 3-5 years. If you are 35 years of age or older, have your blood pressure checked every year. You should have your blood pressure measured twice-once when you are at a hospital or clinic, and once when you are not at a hospital or clinic. Record the average of the two measurements. To check your blood pressure when you are not at a hospital or clinic, you can use: ? An automated blood pressure machine at a pharmacy. ? A home blood pressure monitor.  Talk to your health care provider about your target blood pressure.  If you are between 6-47 years old, ask your health care provider if you should take aspirin to prevent heart disease.  Have regular diabetes screenings by checking your fasting blood sugar level. ? If you are at a normal weight and have a low risk for diabetes, have this test once every three years after the age of 4. ? If you are overweight and have a high risk for diabetes, consider being tested at a younger age or more often.  A one-time screening for abdominal aortic aneurysm (AAA) by ultrasound is recommended for men aged 43-75 years who are current or former smokers. What should I know about preventing infection? Hepatitis B If you have a higher risk for hepatitis B, you should be screened for this virus. Talk with your health care provider to find out if you are at risk for hepatitis B infection. Hepatitis C Blood testing is recommended for:  Everyone born from [birth year] through 1963.  Anyone with known risk factors for hepatitis C.  Sexually Transmitted Diseases (STDs)  You should be screened each year for STDs including gonorrhea and chlamydia if: ? You are sexually active and are younger than 50 years of age. ? You are  older than 50 years of age and your health care provider tells you that you are at risk for this type of infection. ? Your sexual activity has changed since you were last screened and you are at an increased risk for chlamydia or gonorrhea. Ask your health care provider if you are at risk.  Talk with your health care provider about whether you are at high risk of being infected with HIV. Your health care provider may recommend a prescription medicine to help prevent HIV infection.  What else can I do?  Schedule regular health, dental, and eye exams.  Stay current with your vaccines (immunizations).  Do not use any tobacco products, such as cigarettes, chewing tobacco, and e-cigarettes. If you need help quitting, ask your health care provider.  Limit alcohol intake to no more than 2 drinks per day. One drink equals 12 ounces of beer, 5 ounces of  wine, or 1 ounces of hard liquor.  Do not use street drugs.  Do not share needles.  Ask your health care provider for help if you need support or information about quitting drugs.  Tell your health care provider if you often feel depressed.  Tell your health care provider if you have ever been abused or do not feel safe at home. This information is not intended to replace advice given to you by your health care provider. Make sure you discuss any questions you have with your health care provider. Document Released: 01/03/2008 Document Revised: 03/05/2016 Document Reviewed: 04/10/2015 Elsevier Interactive Patient Education  [employer].

## 2017-11-06 ENCOUNTER — Encounter: Payer: Self-pay | Admitting: Family Medicine

## 2017-11-06 DIAGNOSIS — I10 Essential (primary) hypertension: Secondary | ICD-10-CM

## 2017-11-08 MED ORDER — LISINOPRIL 10 MG PO TABS
10.0000 mg | ORAL_TABLET | Freq: Every day | ORAL | 6 refills | Status: DC
Start: 2017-11-08 — End: 2018-07-19

## 2017-11-10 NOTE — Progress Notes (Signed)
[facility] [address], [address] [place], [place] [address] [phone] [phone]  Date:  11/12/2017   Name:  Timothy MoodCorwin E Solis   DOB:  [birth year]   MRN:  [id number]  PCP:  Pearline Cablesopland, Malick Netz C, MD    Chief Complaint: Ankle Pain (c/o right ankle pain. Pain is felt more with movement, pt reports popping sounds with walking. Sx's present for past 2 weeks. )   History of Present Illness:  Timothy Solis is a 52 y.o. very pleasant male patient who presents with the following:  Here today with concern of an ankle injury  I just called in some lisinopril for his BP as it was elevated   BP Readings from Last 3 Encounters:  11/12/17 (!) 145/107  11/05/17 (!) 152/98  09/14/17 (!) 152/109   No issues with his BP getting too low that he has noticed.  He ordered a cuff and it should arrive any day He plans to have gastric bypass at some point, as soon as he is able  He is here today with a right ankle issue- he is able to walk but it hurts If he bumps his achilles he has pain It seems to make a popping sound Started about 2 weeks ago, sx will wax and wane He did not have any injury, he was not exercising more.  He is not really sure what could have triggered this  The left ankle is ok   Patient Active Problem List   Diagnosis Date Noted  . Costochondritis 09/14/2017  . Dyslipidemia 08/30/2017  . IBS (irritable bowel syndrome) 11/05/2015  . Morbid obesity (HCC) 11/05/2015  . Major depressive disorder with single episode, in remission (HCC) 11/05/2015  . Chronic fatigue 11/05/2015  . Allergic rhinitis, cause unspecified 04/01/2013  . Unspecified asthma(493.90) 04/01/2013  . Asthma exacerbation 03/10/2012    Past Medical History:  Diagnosis Date  . Allergy   . Chronic fatigue syndrome   . Depression   . GERD (gastroesophageal reflux disease)   . HTN (hypertension)   . IBS (irritable bowel syndrome)   . IBS (irritable bowel syndrome)    . Nephrolithiasis   . Obesity     Past Surgical History:  Procedure Laterality Date  . BACK SURGERY    . CHOLECYSTECTOMY    . KNEE SURGERY      Social History   Tobacco Use  . Smoking status: Current Some Day Smoker    Types: Cigars  . Smokeless tobacco: Never Used  Substance Use Topics  . Alcohol use: Yes    Comment: occasional  . Drug use: Never    Family History  Problem Relation Age of Onset  . Coronary artery disease Unknown   . Cancer Unknown   . Heart disease Mother   . Multiple sclerosis Father   . Cancer Brother   . Multiple sclerosis Brother   . Heart disease Maternal Grandmother   . Cancer Maternal Grandfather   . Multiple sclerosis Brother     Allergies  Allergen Reactions  . Ceclor [Cefaclor]     Can't remember what reaction had  . Oxycodone Itching  . Prednisone     Medication list has been reviewed and updated.  Current Outpatient Medications on File Prior to Visit  Medication Sig Dispense Refill  . ALBUTEROL IN Inhale into the lungs. Reported on 11/05/2015    . buPROPion (WELLBUTRIN SR) 100 MG 12 hr tablet Take 1 tablet (100 mg  total) by mouth 2 (two) times daily. Take one daily for the first 3 days, then go to BID 60 tablet 6  . citalopram (CELEXA) 40 MG tablet Take 1 tablet (40 mg total) by mouth daily. 90 tablet 3  . colestipol (COLESTID) 1 g tablet Take 1 g by mouth as needed.    . cyclobenzaprine (FLEXERIL) 10 MG tablet TAKE ONE TABLET BY MOUTH ONCE DAILY AS NEEDED FOR MUSCLE SPASM 30 tablet 1  . diazepam (VALIUM) 2 MG tablet Take 1 tablet (2 mg total) by mouth every 8 (eight) hours as needed for anxiety. 20 tablet 0  . dicyclomine (BENTYL) 20 MG tablet TAKE ONE TABLET BY MOUTH TWICE DAILY 180 tablet 3  . DiphenhydrAMINE HCl (BENADRYL ALLERGY PO) Take by mouth. Prn       . fexofenadine (ALLEGRA) 180 MG tablet Take 180 mg by mouth daily.    . fluticasone (FLONASE) 50 MCG/ACT nasal spray Place 2 sprays into the nose daily. 1 g 5  .  gabapentin (NEURONTIN) 300 MG capsule Take 1 capsule (300 mg total) by mouth 3 (three) times daily. 90 capsule 6  . HYDROcodone-acetaminophen (NORCO/VICODIN) 5-325 MG tablet Take 1 tablet by mouth every 6 (six) hours as needed for moderate pain. 20 tablet 0  . lisinopril (PRINIVIL,ZESTRIL) 10 MG tablet Take 1 tablet (10 mg total) by mouth daily. 30 tablet 6  . lovastatin (MEVACOR) 20 MG tablet Take 1 tablet (20 mg total) by mouth daily. 90 tablet 3   No current facility-administered medications on file prior to visit.     Review of Systems:  As per HPI- otherwise negative.   Physical Examination: Vitals:   11/12/17 1505 11/12/17 1511  BP: (!) 180/103 (!) 145/107  Pulse: (!) 105   Temp: (!) 97.5 F (36.4 C)   SpO2: 97%    Vitals:   11/12/17 1505  Weight: (!) 398 lb 6.4 oz (180.7 kg)  Height: 5' 10.5" (1.791 m)   Body mass index is 56.36 kg/m. Ideal Body Weight: Weight in (lb) to have BMI = 25: 176.4  GEN: WDWN, NAD, Non-toxic, A & O x 3, very obese HEENT: Atraumatic, Normocephalic. Neck supple. No masses, No LAD. Ears and Nose: No external deformity. CV: RRR, No M/G/R. No JVD. No thrill. No extra heart sounds. PULM: CTA B, no wheezes, crackles, rhonchi. No retractions. No resp. distress. No accessory muscle use. EXTR: No c/c/e NEURO pt always uses a cane, but is walking more slowly than usual and favoring right today  PSYCH: Normally interactive. Conversant. Not depressed or anxious appearing.  Calm demeanor.  Right ankle and foot: achilles is intact, but he is tender at the achilles insertion at the calcaneous.  Otherwise foot and ankle are normal.  Normal pulses. No heat or rednss   Assessment and Plan: Right Achilles tendinitis  Essential hypertension  BP is still high- will increase his lisinopril to 15 mg He plans bariatric surgery soon- we are excited for him Suspect an achilles tendinitis.  I am concerned as progression to rupture would really not be good in  this very obese and somewhat debilitated pt.  [facility] is able to see him next week which we much appreciate.  He will take it easy until then and is wearing his hiking boots to give his ankle some support   Signed Abbe Amsterdam, MD

## 2017-11-12 ENCOUNTER — Encounter: Payer: Self-pay | Admitting: Family Medicine

## 2017-11-12 ENCOUNTER — Ambulatory Visit (INDEPENDENT_AMBULATORY_CARE_PROVIDER_SITE_OTHER): Payer: Medicare Other | Admitting: Family Medicine

## 2017-11-12 VITALS — BP 145/107 | HR 105 | Temp 97.5°F | Ht 70.5 in | Wt 398.4 lb

## 2017-11-12 DIAGNOSIS — I10 Essential (primary) hypertension: Secondary | ICD-10-CM | POA: Diagnosis not present

## 2017-11-12 DIAGNOSIS — M7661 Achilles tendinitis, right leg: Secondary | ICD-10-CM

## 2017-11-12 NOTE — Patient Instructions (Addendum)
Increase lisinopril to 15 mg daily for your BP- it is still a bit high  Please keep an eye on your BP at home and let me know if not coming to under 140/90 or less   [facility] [phone] [address] [place], [place] [address]  Tuesday 4/30 at 2pm for a 2:30 appt

## 2017-11-17 DIAGNOSIS — M7661 Achilles tendinitis, right leg: Secondary | ICD-10-CM | POA: Diagnosis not present

## 2017-11-17 DIAGNOSIS — M25571 Pain in right ankle and joints of right foot: Secondary | ICD-10-CM | POA: Diagnosis not present

## 2017-12-01 DIAGNOSIS — M7661 Achilles tendinitis, right leg: Secondary | ICD-10-CM | POA: Diagnosis not present

## 2017-12-19 ENCOUNTER — Encounter: Payer: Self-pay | Admitting: Family Medicine

## 2017-12-21 ENCOUNTER — Ambulatory Visit (INDEPENDENT_AMBULATORY_CARE_PROVIDER_SITE_OTHER): Payer: Medicare Other | Admitting: Family Medicine

## 2017-12-21 ENCOUNTER — Encounter: Payer: Self-pay | Admitting: Family Medicine

## 2017-12-21 DIAGNOSIS — F325 Major depressive disorder, single episode, in full remission: Secondary | ICD-10-CM

## 2017-12-21 MED ORDER — BUPROPION HCL ER (SR) 150 MG PO TB12: 150.0000 mg | ORAL_TABLET | Freq: Two times a day (BID) | ORAL | 6 refills | Status: DC

## 2017-12-21 NOTE — Patient Instructions (Addendum)
It was good to see you today-  I am sorry that you are feeling so bad right now Let's increase your wellbutrin to 150 and continue celexa  Please send me a mychart in about one week with an update- sooner if you are worse  Please try to follow a normal sleep/ wake schedule, meal schedule, go outdoors daily and try to accomplish 1-2 tasks per day  If you are not ok please seek care immediatley

## 2017-12-21 NOTE — Progress Notes (Signed)
[facility] [address], [address] [place], [place] [address] [phone]  Date:  12/21/2017   Name:  Timothy Solis   DOB:  [birth year]   MRN:  [id number]  PCP:  Pearline Cables, MD    Chief Complaint: Depression (medication issue, dose not working)   History of Present Illness:  Timothy Solis is a 52 y.o. very pleasant male patient who presents with the following:  Following up today on depression and anxiety  Last seen here 4/25- at that time he was pretty ok and was pursing weight loss surgery Last week he had a panic attack Also for a week or so he has felt more depressed and not able to do as much, does not have as much energy. He notes that he will get up in the morning, watch some tv and end up back in bed, he is sometimes not going outside and is not really getting anything done   Poor appetite, food does not appeal to him  Nothing particular happened to him that he can think could have triggered worsening of his sx  He did take a valium earlier today, around 11 am   He is taking wellbutrin 100 BID and celexa 40 No seizure history  He is still on gabapentin- taking this BID  He feels like he may be noticing some strange smells in his home - just briefly will smell a burning smell, etc.  However he thinks that he is imaging these smells   He does have a CO detector  He has not had any auditory or visual hallucinations We added wellbutrin to his regimen earlier this year - it did seem to really help him at first, but this effect then seemed to wear off  He denies any SI or other problems with self- harm currently    He is still working towards the goal of getting weight loss surgery- he wants to get his mood in order towards this end as well   BP Readings from Last 3 Encounters:  12/21/17 (!) 144/80  11/12/17 (!) 145/107  11/05/17 (!) 152/98   His home BP is running 130s/70s at home   He has a friend who  rents a room in his house who supports him He does describe several people who he can lean on if need be including his ex wife   No other neurological disturbance noted  Patient Active Problem List   Diagnosis Date Noted  . Costochondritis 09/14/2017  . Dyslipidemia 08/30/2017  . IBS (irritable bowel syndrome) 11/05/2015  . Morbid obesity (HCC) 11/05/2015  . Major depressive disorder with single episode, in remission (HCC) 11/05/2015  . Chronic fatigue 11/05/2015  . Allergic rhinitis, cause unspecified 04/01/2013  . Unspecified asthma(493.90) 04/01/2013  . Asthma exacerbation 03/10/2012    Past Medical History:  Diagnosis Date  . Allergy   . Chronic fatigue syndrome   . Depression   . GERD (gastroesophageal reflux disease)   . HTN (hypertension)   . IBS (irritable bowel syndrome)   . IBS (irritable bowel syndrome)   . Nephrolithiasis   . Obesity     Past Surgical History:  Procedure Laterality Date  . BACK SURGERY    . CHOLECYSTECTOMY    . KNEE SURGERY      Social History   Tobacco Use  . Smoking status: Current Some Day Smoker    Types: Cigars  . Smokeless tobacco: Never Used  Substance Use Topics  . Alcohol use: Yes    Comment: occasional  . Drug use: Never    Family History  Problem Relation Age of Onset  . Coronary artery disease Unknown   . Cancer Unknown   . Heart disease Mother   . Multiple sclerosis Father   . Cancer Brother   . Multiple sclerosis Brother   . Heart disease Maternal Grandmother   . Cancer Maternal Grandfather   . Multiple sclerosis Brother     Allergies  Allergen Reactions  . Ceclor [Cefaclor]     Can't remember what reaction had  . Oxycodone Itching  . Prednisone     Medication list has been reviewed and updated.  Current Outpatient Medications on File Prior to Visit  Medication Sig Dispense Refill  . ALBUTEROL IN Inhale into the lungs. Reported on 11/05/2015    . buPROPion (WELLBUTRIN SR) 100 MG 12 hr tablet Take 1  tablet (100 mg total) by mouth 2 (two) times daily. Take one daily for the first 3 days, then go to BID 60 tablet 6  . citalopram (CELEXA) 40 MG tablet Take 1 tablet (40 mg total) by mouth daily. 90 tablet 3  . colestipol (COLESTID) 1 g tablet Take 1 g by mouth as needed.    . cyclobenzaprine (FLEXERIL) 10 MG tablet TAKE ONE TABLET BY MOUTH ONCE DAILY AS NEEDED FOR MUSCLE SPASM 30 tablet 1  . diazepam (VALIUM) 2 MG tablet Take 1 tablet (2 mg total) by mouth every 8 (eight) hours as needed for anxiety. 20 tablet 0  . dicyclomine (BENTYL) 20 MG tablet TAKE ONE TABLET BY MOUTH TWICE DAILY 180 tablet 3  . DiphenhydrAMINE HCl (BENADRYL ALLERGY PO) Take by mouth. Prn       . fexofenadine (ALLEGRA) 180 MG tablet Take 180 mg by mouth daily.    . fluticasone (FLONASE) 50 MCG/ACT nasal spray Place 2 sprays into the nose daily. 1 g 5  . gabapentin (NEURONTIN) 300 MG capsule Take 1 capsule (300 mg total) by mouth 3 (three) times daily. 90 capsule 6  . HYDROcodone-acetaminophen (NORCO/VICODIN) 5-325 MG tablet Take 1 tablet by mouth every 6 (six) hours as needed for moderate pain. 20 tablet 0  . lisinopril (PRINIVIL,ZESTRIL) 10 MG tablet Take 1 tablet (10 mg total) by mouth daily. 30 tablet 6  . lovastatin (MEVACOR) 20 MG tablet Take 1 tablet (20 mg total) by mouth daily. 90 tablet 3   No current facility-administered medications on file prior to visit.     Review of Systems:  As per HPI- otherwise negative. No fever or chills No vomiting or diarrhea  Physical Examination: Vitals:   12/21/17 1513  BP: (!) 144/80  Pulse: 97  Resp: (!) 22  SpO2: 95%   Vitals:   12/21/17 1513  Weight: (!) 404 lb (183.3 kg)  Height: 5' 10.5" (1.791 m)   Body mass index is 57.15 kg/m. Ideal Body Weight: Weight in (lb) to have BMI = 25: 176.4  GEN: WDWN, NAD, Non-toxic, A & O x 3, morbid obesity, looks well  HEENT: Atraumatic, Normocephalic. Neck supple. No masses, No LAD. Ears and Nose: No external  deformity. CV: RRR, No M/G/R. No JVD. No thrill. No extra heart sounds. PULM: CTA B, no wheezes, crackles, rhonchi. No retractions. No resp. distress. No accessory muscle use. EXTR: No c/c/e NEURO Normal gait for pt, uses a cane PSYCH: Normally interactive. Conversant. Not depressed or anxious appearing.  Calm demeanor.    Assessment and  Plan: Major depressive disorder with single episode, in remission (HCC) - Plan: buPROPion (WELLBUTRIN SR) 150 MG 12 hr tablet  Here today with depression He had struggled with this on and off.  He was doing ok more recently but has felt worse over the last couple of weeks again Continue celexa Increase wellbutrin to 150 BID He is on occasional ativan He will see me in one week Encouraged him to set a daily schedule, get up and out of bed, go outdoors, etc He agrees to seek help right away if not safe   Signed Abbe AmsterdamJessica Tamyka Bezio, MD

## 2018-01-05 ENCOUNTER — Encounter: Payer: Self-pay | Admitting: Family Medicine

## 2018-01-05 DIAGNOSIS — F325 Major depressive disorder, single episode, in full remission: Secondary | ICD-10-CM

## 2018-01-06 MED ORDER — DIAZEPAM 5 MG PO TABS: 5.0000 mg | ORAL_TABLET | Freq: Three times a day (TID) | ORAL | 1 refills | Status: DC | PRN

## 2018-01-06 MED ORDER — BUPROPION HCL ER (SR) 100 MG PO TB12: 100.0000 mg | ORAL_TABLET | Freq: Two times a day (BID) | ORAL | 3 refills | Status: DC

## 2018-01-06 MED ORDER — DIAZEPAM 5 MG PO TABS: ORAL_TABLET | ORAL | 1 refills | Status: DC

## 2018-01-06 NOTE — Telephone Encounter (Signed)
Called him to discuss- his anxiety may be due to recently increased Wellbutrin dose to 150 xr Will decrease back to 100 xr BID- he will hold dose tonight and start on the 100 tomorrow The diazepam 2 mg is not really helping him.  Will rx 5 mg, he will try a 1/2 pill first but can take the other 1/2 if need be He denies any SI or worries about self harm. He will keep me closely apprised as to his progress over mychart and we plan to see each other in about one month   10/19/2017  1  10/19/2017  Diazepam 2 Mg Tablet  20 7 Je Cop  [id number]  [facility] ([id number])  0 0.57 LME Private Pay  [place]  08/31/2017  1  08/31/2017  Hydrocodone-Acetamin 5-325 Mg  20 5 Je Cop  [id number]  [facility] ([id number])  0 20.00 MME Private Pay  [place]  05/05/2017  1  04/30/2017  Hydrocodone-Acetamin 5-325 Mg  15 4 Je Cop  [id number]  [facility] ([id number])  0 18.75 MME Private Pay  [place]  12/24/2016  1  12/24/2016  Diazepam 2 Mg Tablet  20 7 Je Cop  [id number]  [facility] ([id number])  0 0.57 LME     Meds ordered this encounter  Medications  . DISCONTD: diazepam (VALIUM) 5 MG tablet    Sig: Take 1 tablet (5 mg total) by mouth every 8 (eight) hours as needed for anxiety.    Dispense:  15 tablet    Refill:  1  . buPROPion (WELLBUTRIN SR) 100 MG 12 hr tablet    Sig: Take 1 tablet (100 mg total) by mouth 2 (two) times daily.    Dispense:  60 tablet    Refill:  3  . diazepam (VALIUM) 5 MG tablet    Sig: Take 1/2 tablet twice daily as needed.  May take a whole tablet if necessary    Dispense:  15 tablet    Refill:  1

## 2018-01-10 DIAGNOSIS — S61042A Puncture wound with foreign body of left thumb without damage to nail, initial encounter: Secondary | ICD-10-CM | POA: Diagnosis not present

## 2018-02-13 NOTE — Progress Notes (Addendum)
[facility] [address], [address] [place], [place] [address] [phone]  Date:  02/15/2018   Name:  Timothy Solis   DOB:  [birth year]   MRN:  [id number]  PCP:  Pearline Cables, MD    Chief Complaint: Allergic Reaction (follow up, increase wellbutrin bad side effects, decreaed med again)   History of Present Illness:  Timothy Solis is a 52 y.o. very pleasant male patient who presents with the following:  Following up today Having more difficulty with his mood recently, but reports that he is now improved-  I saw him on 6/3 at which time we tried going up on his wellbutrin-  Here today with depression He had struggled with this on and off.  He was doing ok more recently but has felt worse over the last couple of weeks again Continue celexa Increase wellbutrin to 150 BID He is on occasional ativan He will see me in one week Encouraged him to set a daily schedule, get up and out of bed, go outdoors, etc He agrees to seek help right away if not safe  However this seemed to exacerbate his anxiety and we had to decrease again ///////////////////////////////////////////// Called him to discuss- his anxiety may be due to recently increased Wellbutrin dose to 150 xr Will decrease back to 100 xr BID- he will hold dose tonight and start on the 100 tomorrow The diazepam 2 mg is not really helping him.  Will rx 5 mg, he will try a 1/2 pill first but can take the other 1/2 if need be He denies any SI or worries about self harm. He will keep me closely apprised as to his progress over mychart and we plan to see each other in about one month   Colon cancer screening: no family history of colon cancer, he would like to do cologuard for screening Also due for complete labs now   He notes that right now he is not really bothered by depression except about having recently turned 52! His brother gave him a cello and he is looking forward  to learning how to play it.  He plans to teach himself  He has noted a ST for about 2 weeks now- no fever however.  Also his left ear hurts His left sided throat hurt and it hurts to swallow No cough really  No vomiting or diarrhea No fever noted He was given we think clindamycin a few weeks ago for an infected finger due to a splinter  He is down to his last hydrocodone which we use for sporadic back pain  NCCSR: he last filled 20 hydrocodone in February of this year  He does need to do a UDS today which is fine with him   Patient Active Problem List   Diagnosis Date Noted  . Costochondritis 09/14/2017  . Dyslipidemia 08/30/2017  . IBS (irritable bowel syndrome) 11/05/2015  . Morbid obesity (HCC) 11/05/2015  . Major depressive disorder with single episode, in remission (HCC) 11/05/2015  . Chronic fatigue 11/05/2015  . Allergic rhinitis, cause unspecified 04/01/2013  . Unspecified asthma(493.90) 04/01/2013    Past Medical History:  Diagnosis Date  . Allergy   . Chronic fatigue syndrome   . Depression   . GERD (gastroesophageal reflux disease)   . HTN (hypertension)   . IBS (irritable bowel syndrome)   . IBS (irritable bowel syndrome)   . Nephrolithiasis   . Obesity  Past Surgical History:  Procedure Laterality Date  . BACK SURGERY    . CHOLECYSTECTOMY    . KNEE SURGERY      Social History   Tobacco Use  . Smoking status: Current Some Day Smoker    Types: Cigars  . Smokeless tobacco: Never Used  Substance Use Topics  . Alcohol use: Yes    Comment: occasional  . Drug use: Never    Family History  Problem Relation Age of Onset  . Coronary artery disease Unknown   . Cancer Unknown   . Heart disease Mother   . Multiple sclerosis Father   . Cancer Brother   . Multiple sclerosis Brother   . Heart disease Maternal Grandmother   . Cancer Maternal Grandfather   . Multiple sclerosis Brother     Allergies  Allergen Reactions  . Ceclor [Cefaclor]      Can't remember what reaction had  . Oxycodone Itching  . Prednisone     Medication list has been reviewed and updated.  Current Outpatient Medications on File Prior to Visit  Medication Sig Dispense Refill  . ALBUTEROL IN Inhale into the lungs. Reported on 11/05/2015    . buPROPion (WELLBUTRIN SR) 100 MG 12 hr tablet Take 1 tablet (100 mg total) by mouth 2 (two) times daily. 60 tablet 3  . citalopram (CELEXA) 40 MG tablet Take 1 tablet (40 mg total) by mouth daily. 90 tablet 3  . colestipol (COLESTID) 1 g tablet Take 1 g by mouth as needed.    . cyclobenzaprine (FLEXERIL) 10 MG tablet TAKE ONE TABLET BY MOUTH ONCE DAILY AS NEEDED FOR MUSCLE SPASM 30 tablet 1  . diazepam (VALIUM) 5 MG tablet Take 1/2 tablet twice daily as needed.  May take a whole tablet if necessary 15 tablet 1  . dicyclomine (BENTYL) 20 MG tablet TAKE ONE TABLET BY MOUTH TWICE DAILY 180 tablet 3  . DiphenhydrAMINE HCl (BENADRYL ALLERGY PO) Take by mouth. Prn       . fexofenadine (ALLEGRA) 180 MG tablet Take 180 mg by mouth daily.    . fluticasone (FLONASE) 50 MCG/ACT nasal spray Place 2 sprays into the nose daily. 1 g 5  . gabapentin (NEURONTIN) 300 MG capsule Take 1 capsule (300 mg total) by mouth 3 (three) times daily. 90 capsule 6  . lisinopril (PRINIVIL,ZESTRIL) 10 MG tablet Take 1 tablet (10 mg total) by mouth daily. 30 tablet 6  . lovastatin (MEVACOR) 20 MG tablet Take 1 tablet (20 mg total) by mouth daily. 90 tablet 3   No current facility-administered medications on file prior to visit.     Review of Systems:  As per HPI- otherwise negative. No fever or chills noted    Physical Examination: Vitals:   02/15/18 1408 02/15/18 1946  BP: 132/78   Pulse: (!) 109 96  Resp: (!) 22   Temp: 98.3 F (36.8 C)   SpO2: 97%    Vitals:   02/15/18 1408  Weight: (!) 414 lb (187.8 kg)  Height: 5' 10.5" (1.791 m)   Body mass index is 58.56 kg/m. Ideal Body Weight: Weight in (lb) to have BMI = 25: 176.4  GEN:  WDWN, NAD, Non-toxic, A & O x 3, very obese but otherwise looks well  HEENT: Atraumatic, Normocephalic. Neck supple. No masses, No LAD.  Bilateral TM wnl, oropharynx normal.  PEERL,EOMI.   Ears and Nose: No external deformity. CV: RRR, No M/G/R. No JVD. No thrill. No extra heart sounds. PULM: CTA B, no  wheezes, crackles, rhonchi. No retractions. No resp. distress. No accessory muscle use. ABD: S, NT, ND, +BS. No rebound. No HSM. EXTR: No c/c/e NEURO Normal gait.  PSYCH: Normally interactive. Conversant. Not depressed or anxious appearing.  Calm demeanor.    Assessment and Plan: Essential hypertension  Screening for deficiency anemia  Screening for diabetes mellitus - Plan: Hemoglobin A1c  Screening for prostate cancer - Plan: PSA, Medicare  Screening for hyperlipidemia - Plan: Lipid panel  Major depressive disorder with single episode, in remission (HCC)  Dyslipidemia  Pharyngitis, unspecified etiology - Plan: azithromycin (ZITHROMAX) 250 MG tablet  Chronic midline low back pain with bilateral sciatica - Plan: HYDROcodone-acetaminophen (NORCO/VICODIN) 5-325 MG tablet, Pain Mgmt, Profile 8 w/Conf, U  Medication management  Hyperglycemia - Plan: Hemoglobin A1c  Following up today Labs pending as above Happily Ridley's depression and anxiety are under good control at the moment.  Continue current medications ST, earache for 2 weeks- he has an uncertain history of cephalosporin allergy. Will treat with a zpack for possible sinusitis  Refilled his hydrocodone which he rarely uses  cologuard ordered UDS today See patient instructions for more details.     Signed Abbe Amsterdam, MD  Received his albs 7/30- message to pt A1c has gone up  Results for orders placed or performed in visit on 02/15/18  Hemoglobin A1c  Result Value Ref Range   Hgb A1c MFr Bld 6.5 4.6 - 6.5 %  Lipid panel  Result Value Ref Range   Cholesterol 202 (H) 0 - 200 mg/dL   Triglycerides 308.6 (H)  0.0 - 149.0 mg/dL   HDL 57.84 (L) >69.62 mg/dL   VLDL 95.2 (H) 0.0 - 84.1 mg/dL   Total CHOL/HDL Ratio 5    NonHDL 165.16   PSA, Medicare  Result Value Ref Range   PSA 0.61 0.10 - 4.00 ng/ml  LDL cholesterol, direct  Result Value Ref Range   Direct LDL 127.0 mg/dL    Your PSA looks fine- we will repeat this in one year  Your cholesterol has really not improved any- I think we need to go up from the lovastatin.  I am going to send in an rx for Crestor/ rosuvastatin for you to give you more cholesterol lowering power.   Let me know if any issues in making this change  Your A1c has also gone up and is now in the diabetes range- just at the cut off which is 6.5%.  We don't officially diagnose diabetes until you are over 6.5% twice however.  We might consider starting metformin for you- this is an oral medication which is helpful in preventing progression to diabetes and in blood sugar control.  Would this be ok with you?  Hikaru, you had mentioned looking at weight loss surgery at some point in the past.  Is this still something you are considering?  I am afraid that your weight is catching up with you and I am getting more worried about your health.  If you are still thinking about weight surgery I would encourage you to move forward as soon as you feel ready.    Take care and let's visit in about 6 months assuming all is going well!  Received his UDS on 8/1 as well

## 2018-02-15 ENCOUNTER — Encounter: Payer: Self-pay | Admitting: Family Medicine

## 2018-02-15 ENCOUNTER — Ambulatory Visit (INDEPENDENT_AMBULATORY_CARE_PROVIDER_SITE_OTHER): Payer: Medicare Other | Admitting: Family Medicine

## 2018-02-15 VITALS — BP 132/78 | HR 96 | Temp 98.3°F | Resp 22 | Ht 70.5 in | Wt >= 6400 oz

## 2018-02-15 DIAGNOSIS — G8929 Other chronic pain: Secondary | ICD-10-CM | POA: Diagnosis not present

## 2018-02-15 DIAGNOSIS — Z130 Encounter for screening for diseases of the blood and blood-forming organs and certain disorders involving the immune mechanism: Secondary | ICD-10-CM | POA: Diagnosis not present

## 2018-02-15 DIAGNOSIS — Z13220 Encounter for screening for lipoid disorders: Secondary | ICD-10-CM

## 2018-02-15 DIAGNOSIS — M5441 Lumbago with sciatica, right side: Secondary | ICD-10-CM | POA: Diagnosis not present

## 2018-02-15 DIAGNOSIS — Z131 Encounter for screening for diabetes mellitus: Secondary | ICD-10-CM | POA: Diagnosis not present

## 2018-02-15 DIAGNOSIS — F325 Major depressive disorder, single episode, in full remission: Secondary | ICD-10-CM | POA: Diagnosis not present

## 2018-02-15 DIAGNOSIS — M5442 Lumbago with sciatica, left side: Secondary | ICD-10-CM | POA: Diagnosis not present

## 2018-02-15 DIAGNOSIS — I10 Essential (primary) hypertension: Secondary | ICD-10-CM

## 2018-02-15 DIAGNOSIS — J029 Acute pharyngitis, unspecified: Secondary | ICD-10-CM

## 2018-02-15 DIAGNOSIS — E785 Hyperlipidemia, unspecified: Secondary | ICD-10-CM | POA: Diagnosis not present

## 2018-02-15 DIAGNOSIS — Z125 Encounter for screening for malignant neoplasm of prostate: Secondary | ICD-10-CM

## 2018-02-15 DIAGNOSIS — Z79899 Other long term (current) drug therapy: Secondary | ICD-10-CM | POA: Diagnosis not present

## 2018-02-15 DIAGNOSIS — R739 Hyperglycemia, unspecified: Secondary | ICD-10-CM | POA: Diagnosis not present

## 2018-02-15 MED ORDER — HYDROCODONE-ACETAMINOPHEN 5-325 MG PO TABS: 1.0000 | ORAL_TABLET | Freq: Four times a day (QID) | ORAL | 0 refills | Status: DC | PRN

## 2018-02-15 MED ORDER — AZITHROMYCIN 250 MG PO TABS: ORAL_TABLET | ORAL | 0 refills | Status: DC

## 2018-02-15 NOTE — Patient Instructions (Addendum)
Good to see you today- I am glad that your mood is doing well right now I am going to use azithromycin to treat your sore throat and earache- let me know if not better We will get routine labs for you today and will order cologuard to screen for colon cancer I refilled your hydrocodone today.  Please give the lab a urine sample - we are required to do annual drug screens on anyone who uses even small amounts of narcotics now  Please let me know if your throat/ ear are not better and take care!

## 2018-02-16 ENCOUNTER — Encounter: Payer: Self-pay | Admitting: Family Medicine

## 2018-02-16 DIAGNOSIS — R739 Hyperglycemia, unspecified: Secondary | ICD-10-CM

## 2018-02-16 LAB — LIPID PANEL
CHOL/HDL RATIO: 5
CHOLESTEROL: 202 mg/dL — AB (ref 0–200)
HDL: 37.3 mg/dL — ABNORMAL LOW (ref >39.00)
NonHDL: 165.16
Triglycerides: 213 mg/dL — ABNORMAL HIGH (ref 0.0–149.0)
VLDL: 42.6 mg/dL — ABNORMAL HIGH (ref 0.0–40.0)

## 2018-02-16 LAB — LDL CHOLESTEROL, DIRECT: LDL DIRECT: 127 mg/dL

## 2018-02-16 LAB — HEMOGLOBIN A1C: Hgb A1c MFr Bld: 6.5 % (ref 4.6–6.5)

## 2018-02-16 LAB — PSA, MEDICARE: PSA: 0.61 ng/mL (ref 0.10–4.00)

## 2018-02-16 MED ORDER — ROSUVASTATIN CALCIUM 20 MG PO TABS: 20.0000 mg | ORAL_TABLET | Freq: Every day | ORAL | 3 refills | Status: DC

## 2018-02-16 NOTE — Addendum Note (Signed)
Addended by: Abbe AmsterdamOPLAND, Patricia Perales C on: 02/16/2018 05:53 PM   Modules accepted: Orders

## 2018-02-18 LAB — PAIN MGMT, PROFILE 8 W/CONF, U
6 Acetylmorphine: NEGATIVE ng/mL (ref <10)
ALCOHOL METABOLITES: NEGATIVE ng/mL (ref <500)
Alphahydroxyalprazolam: NEGATIVE ng/mL (ref <25)
Alphahydroxymidazolam: NEGATIVE ng/mL (ref <50)
Alphahydroxytriazolam: NEGATIVE ng/mL (ref <50)
Aminoclonazepam: NEGATIVE ng/mL (ref <25)
Amphetamines: NEGATIVE ng/mL (ref <500)
BENZODIAZEPINES: POSITIVE ng/mL — AB (ref <100)
Buprenorphine, Urine: NEGATIVE ng/mL (ref <5)
Cocaine Metabolite: NEGATIVE ng/mL (ref <150)
Codeine: NEGATIVE ng/mL (ref <50)
Creatinine: 295.8 mg/dL
Hydrocodone: NEGATIVE ng/mL (ref <50)
Hydromorphone: NEGATIVE ng/mL (ref <50)
Hydroxyethylflurazepam: NEGATIVE ng/mL (ref <50)
Lorazepam: NEGATIVE ng/mL (ref <50)
MDMA: NEGATIVE ng/mL (ref <500)
MORPHINE: NEGATIVE ng/mL (ref <50)
Marijuana Metabolite: NEGATIVE ng/mL (ref <20)
Nordiazepam: 82 ng/mL — ABNORMAL HIGH (ref <50)
Norhydrocodone: NEGATIVE ng/mL (ref <50)
Opiates: NEGATIVE ng/mL (ref <100)
Oxazepam: 230 ng/mL — ABNORMAL HIGH (ref <50)
Oxidant: NEGATIVE ug/mL (ref <200)
Oxycodone: NEGATIVE ng/mL (ref <100)
PH: 5.96 (ref 4.5–9.0)
Temazepam: 233 ng/mL — ABNORMAL HIGH (ref <50)

## 2018-02-23 MED ORDER — METFORMIN HCL 500 MG PO TABS: 500.0000 mg | ORAL_TABLET | Freq: Every day | ORAL | 3 refills | Status: DC

## 2018-03-03 DIAGNOSIS — Z1212 Encounter for screening for malignant neoplasm of rectum: Secondary | ICD-10-CM | POA: Diagnosis not present

## 2018-03-03 DIAGNOSIS — Z1211 Encounter for screening for malignant neoplasm of colon: Secondary | ICD-10-CM | POA: Diagnosis not present

## 2018-03-11 LAB — COLOGUARD: Cologuard: POSITIVE

## 2018-03-17 ENCOUNTER — Other Ambulatory Visit: Payer: Self-pay | Admitting: Family Medicine

## 2018-03-17 DIAGNOSIS — K589 Irritable bowel syndrome without diarrhea: Secondary | ICD-10-CM

## 2018-03-18 ENCOUNTER — Other Ambulatory Visit: Payer: Self-pay | Admitting: Family Medicine

## 2018-03-18 ENCOUNTER — Encounter: Payer: Self-pay | Admitting: Family Medicine

## 2018-03-18 DIAGNOSIS — R195 Other fecal abnormalities: Secondary | ICD-10-CM

## 2018-03-19 ENCOUNTER — Encounter: Payer: Self-pay | Admitting: Gastroenterology

## 2018-04-15 ENCOUNTER — Other Ambulatory Visit: Payer: Self-pay | Admitting: Family Medicine

## 2018-04-15 DIAGNOSIS — G8929 Other chronic pain: Secondary | ICD-10-CM

## 2018-04-15 DIAGNOSIS — M545 Low back pain, unspecified: Secondary | ICD-10-CM

## 2018-04-20 ENCOUNTER — Telehealth: Payer: Self-pay | Admitting: *Deleted

## 2018-04-20 NOTE — Telephone Encounter (Signed)
Pt has a BMI of 58.54 and 414 lb- pt has some medical hx that he is needs an OV- I called and LM for pt to return my call for [place] to make an OV with Dr Jeannette Corpus

## 2018-04-21 NOTE — Telephone Encounter (Signed)
Pt has a PV 10-3- need to cancel if he shows and make OV with Chales Abrahams

## 2018-04-21 NOTE — Telephone Encounter (Signed)
Called pt again- no answer- LMTRC to return call today

## 2018-04-21 NOTE — Telephone Encounter (Signed)
Called pt with no answer- lm to call [facility] by 5 pm today or before PV appt tomorrow 10-3  Hilda Lias

## 2018-04-22 NOTE — Telephone Encounter (Signed)
Patient is scheduled for PV on 04/22/18 at 3:30 Pm. Patients BMI is 58.54 and needs to have procedure at the hospital. Numerous attempts were made to reach the patient to schedule him for an OV with Dr. Chales Abrahams. A message was left for [place] to call and reschedule. If the patient does not call back before 5:00 a no show letter will be mailed. Patient was informed that we needed to cancel his scheduled colonoscopy.   Janalee Dane, LPN ( PV )

## 2018-05-04 ENCOUNTER — Encounter: Payer: Medicare Other | Admitting: Gastroenterology

## 2018-06-16 ENCOUNTER — Other Ambulatory Visit: Payer: Self-pay | Admitting: Family Medicine

## 2018-06-16 DIAGNOSIS — M545 Low back pain, unspecified: Secondary | ICD-10-CM

## 2018-06-16 DIAGNOSIS — R079 Chest pain, unspecified: Secondary | ICD-10-CM

## 2018-06-16 DIAGNOSIS — G8929 Other chronic pain: Secondary | ICD-10-CM

## 2018-07-17 NOTE — Progress Notes (Addendum)
[facility] [address], [address] [place], [place] [id number] [phone] [phone]  Date:  07/19/2018   Name:  Timothy MoodCorwin E Raska   DOB:  [birth year]   MRN:  [id number]  PCP:  Pearline Cablesopland, Jessica C, MD    Chief Complaint: Sore Throat (one month, since thanksgiving, no fever) and Hypertension (recheck BP)   History of Present Illness:  Timothy Solis is a 52 y.o. very pleasant male patient who presents with the following:  Timothy GirtCorwin is here today for concern of sore throat.  He is well-known to me, with history of severe obesity, chronic fatigue, depression, prediabetes, dyslipidemia.  He uses occasional hydrocodone for sporadic back pain I last saw him in July  He has so far only had one A1c at 6.5 or above.  I did start him on metformin in July for glucose control We also changed his statin from lovastatin to Crestor  We ordered Cologuard at his last visit, as he recently turned 52.  This was positive and I referred him to gastroenterology.  He is working on getting his colonoscopy rescheduled, he had to cancel due to a flare of his back pain At his last visit: Also had a sore throat and earache, which we treated with azithromycin This did get his throat back to normal for a time  Today he notes a right sided ST which has been present for 6 weeks It is sharp and stabbing, and has been persistent Also if he sticks out his tongue to the left he has this pain His voice has been hoarse as well He has pain with swallowing  Admits he is worried about cancer as his brother had some sort of throat cancer His home BP is running 110- 120/70- 80s.    He stopped lisinopril as his rx ran out and he has not had time to get it refilled yet - he stopped taking this about 2 weeks ago   He has not noted any issues with his reflux unless he eats Congochinese food   He has been a cigar smoker but quit about 7 weeks ago He may smoke a cigar a few times a month He  drinks alcohol on rare occasional  Lab Results  Component Value Date   HGBA1C 6.5 02/15/2018   Flu vaccine: today  HIV screening:  He uses rare hydrocodone for his back pain- last rx given in July.  He has a few left and would like a refill UDS in July  NCCSR:  06/16/2018  1   01/06/2018  Diazepam 5 Mg Tablet  15.00 5 Je Cop  [id number]  [id number]  1/1 1.50 LME Private Pay  [place]  02/15/2018  1   02/15/2018  Hydrocodone-Acetamin 5-325 Mg  20.00 5 Je Cop  [id number]  [facility] ([id number])  0/0 20.00 MME Comm Ins  [place]  01/06/2018  1   01/06/2018  Diazepam 5 Mg Tablet  15.00 15 Je Cop  [id number]  [facility] ([id number])  0/1 0.50 LME Private Pay  [place]  01/06/2018  1   01/06/2018  Diazepam 5 Mg Tablet  15.00 5 Je Cop  [id number]  [facility] ([id number])  0/1 1.50 LME Private Pay  [place]  10/19/2017  1   10/19/2017  Diazepam 2 Mg Tablet  20.00 7 Je Cop  [id number]  [facility] ([id number])  0/0 0.57 LME Private Pay  [place]  08/31/2017  1   08/31/2017  Hydrocodone-Acetamin 5-325 Mg  20.00  5 Je Cop  [id number]  [facility] [phone])  0/0        Patient Active Problem List   Diagnosis Date Noted  . Costochondritis 09/14/2017  . Dyslipidemia 08/30/2017  . IBS (irritable bowel syndrome) 11/05/2015  . Morbid obesity (HCC) 11/05/2015  . Major depressive disorder with single episode, in remission (HCC) 11/05/2015  . Chronic fatigue 11/05/2015  . Allergic rhinitis, cause unspecified 04/01/2013  . Unspecified asthma(493.90) 04/01/2013    Past Medical History:  Diagnosis Date  . Allergy   . Chronic fatigue syndrome   . Depression   . GERD (gastroesophageal reflux disease)   . HTN (hypertension)   . IBS (irritable bowel syndrome)   . IBS (irritable bowel syndrome)   . Nephrolithiasis   . Obesity     Past Surgical History:  Procedure Laterality Date  . BACK SURGERY    . CHOLECYSTECTOMY    . KNEE SURGERY      Social History   Tobacco Use  . Smoking status: Current Some Day Smoker    Types: Cigars  . Smokeless tobacco: Never Used  Substance Use Topics  . Alcohol use:  Yes    Comment: occasional  . Drug use: Never    Family History  Problem Relation Age of Onset  . Coronary artery disease Unknown   . Cancer Unknown   . Heart disease Mother   . Multiple sclerosis Father   . Cancer Brother   . Multiple sclerosis Brother   . Heart disease Maternal Grandmother   . Cancer Maternal Grandfather   . Multiple sclerosis Brother     Allergies  Allergen Reactions  . Ceclor [Cefaclor]     Can't remember what reaction had  . Oxycodone Itching  . Prednisone     Medication list has been reviewed and updated.  Current Outpatient Medications on File Prior to Visit  Medication Sig Dispense Refill  . ALBUTEROL IN Inhale into the lungs. Reported on 11/05/2015    . buPROPion (WELLBUTRIN SR) 100 MG 12 hr tablet Take 1 tablet (100 mg total) by mouth 2 (two) times daily. 60 tablet 3  . citalopram (CELEXA) 40 MG tablet Take 1 tablet (40 mg total) by mouth daily. 90 tablet 3  . colestipol (COLESTID) 1 g tablet Take 1 g by mouth as needed.    . cyclobenzaprine (FLEXERIL) 10 MG tablet TAKE 1 TABLET BY MOUTH BY MOUTH AS NEEDED FOR MUSCLE SPASM 30 tablet 2  . diazepam (VALIUM) 5 MG tablet Take 1/2 tablet twice daily as needed.  May take a whole tablet if necessary 15 tablet 1  . dicyclomine (BENTYL) 20 MG tablet TAKE 1 TABLET BY MOUTH TWICE DAILY 180 tablet 3  . DiphenhydrAMINE HCl (BENADRYL ALLERGY PO) Take by mouth. Prn       . fexofenadine (ALLEGRA) 180 MG tablet Take 180 mg by mouth daily.    . fluticasone (FLONASE) 50 MCG/ACT nasal spray Place 2 sprays into the nose daily. 1 g 5  . gabapentin (NEURONTIN) 300 MG capsule TAKE 1 CAPSULE BY MOUTH THREE TIMES DAILY 270 capsule 1  . HYDROcodone-acetaminophen (NORCO/VICODIN) 5-325 MG tablet Take 1 tablet by mouth every 6 (six) hours as needed for moderate pain. 20 tablet 0  . metFORMIN (GLUCOPHAGE) 500 MG tablet Take 1 tablet (500 mg total) by mouth daily. 90 tablet 3  . rosuvastatin (CRESTOR) 20 MG tablet Take 1 tablet  (20 mg total) by mouth daily. 90 tablet 3   No current facility-administered medications on file  prior to visit.     Review of Systems:  As per HPI- otherwise negative.   Physical Examination: Vitals:   07/19/18 1509 07/19/18 1536  BP: (!) 180/110 (!) 158/90  Pulse: (!) 101 96  Resp: 20   Temp: 98.8 F (37.1 C)   SpO2: 98%    Vitals:   07/19/18 1509  Weight: (!) 412 lb (186.9 kg)  Height: 5' 10.5" (1.791 m)   Body mass index is 58.28 kg/m. Ideal Body Weight: Weight in (lb) to have BMI = 25: 176.4  GEN: WDWN, NAD, Non-toxic, A & O x 3, quite obese but looks well  Voice is hoarse today HEENT: Atraumatic, Normocephalic. Neck supple. No masses, No LAD.  Bilateral TM wnl, oropharynx normal.  PEERL,EOMI.   I do not appreciate any abnl of his throat on exam, but he does have a tender and slightly enlarged node under his right jaw.   Ears and Nose: No external deformity. CV: RRR, No M/G/R. No JVD. No thrill. No extra heart sounds. PULM: CTA B, no wheezes, crackles, rhonchi. No retractions. No resp. distress. No accessory muscle use. EXTR: No c/c/e NEURO Normal gait.  PSYCH: Normally interactive. Conversant. Not depressed or anxious appearing.  Calm demeanor.   Assessment and Plan: Dyslipidemia - Plan: Lipid panel  Pre-diabetes - Plan: Hemoglobin A1c, Basic metabolic panel  Essential hypertension - Plan: lisinopril (PRINIVIL,ZESTRIL) 10 MG tablet, Basic metabolic panel, CBC  Sore throat - Plan: Culture, Group A Strep  Hoarse voice quality  Chronic midline low back pain with bilateral sciatica - Plan: HYDROcodone-acetaminophen (NORCO/VICODIN) 5-325 MG tablet  Here today with concern of a sore throat and hoarse voice quality for several weeks  He is a cigar smoker and worries that he could have some sort of cancer Referral to ENT and will obtain a CT of his jaw to eval for any abnormality of node Throat culture pending but do not think this will be positive Check on A1c  and lipids today BP much better on recheck- will have him start back on lisinopril   Signed Abbe AmsterdamJessica Copland, MD  12/31 I spoke with radiology who recommended a CT of the neck with contrast which I have ordered Received labs as below, MyChart message to patient Results for orders placed or performed in visit on 07/19/18  Hemoglobin A1c  Result Value Ref Range   Hgb A1c MFr Bld 6.5 4.6 - 6.5 %  Lipid panel  Result Value Ref Range   Cholesterol 137 0 - 200 mg/dL   Triglycerides 782.9248.0 (H) 0.0 - 149.0 mg/dL   HDL 56.2141.30 >30.86>39.00 mg/dL   VLDL 57.849.6 (H) 0.0 - 46.940.0 mg/dL   Total CHOL/HDL Ratio 3    NonHDL 95.78   Basic metabolic panel  Result Value Ref Range   Sodium 143 135 - 145 mEq/L   Potassium 4.6 3.5 - 5.1 mEq/L   Chloride 103 96 - 112 mEq/L   CO2 30 19 - 32 mEq/L   Glucose, Bld 131 (H) 70 - 99 mg/dL   BUN 16 6 - 23 mg/dL   Creatinine, Ser 6.291.20 0.40 - 1.50 mg/dL   Calcium 9.4 8.4 - 52.810.5 mg/dL   GFR 41.3268.00 >44.01>60.00 mL/min  CBC  Result Value Ref Range   WBC 10.6 (H) 4.0 - 10.5 K/uL   RBC 5.32 4.22 - 5.81 Mil/uL   Platelets 297.0 150.0 - 400.0 K/uL   Hemoglobin 15.1 13.0 - 17.0 g/dL   HCT 02.745.5 25.339.0 - 66.452.0 %  MCV 85.5 78.0 - 100.0 fl   MCHC 33.2 30.0 - 36.0 g/dL   RDW 16.1 09.6 - 04.5 %  LDL cholesterol, direct  Result Value Ref Range   Direct LDL 71.0 mg/dL   W0J has now been 6.5 on 2 occasions, so we will diagnose this patient with diabetes He is on metformin and Crestor, and his cholesterol is under reasonable control  The 10-year ASCVD risk score Denman George DC Montez Hageman., et al., 2013) is: 9.3%   Values used to calculate the score:     Age: 69 years     Sex: Male     Is Non-Hispanic African American: No     Diabetic: No     Tobacco smoker: Yes     Systolic Blood Pressure: 158 mmHg     Is BP treated: Yes     HDL Cholesterol: 41.3 mg/dL     Total Cholesterol: 137 mg/dL

## 2018-07-19 ENCOUNTER — Ambulatory Visit (INDEPENDENT_AMBULATORY_CARE_PROVIDER_SITE_OTHER): Payer: Medicare Other | Admitting: Family Medicine

## 2018-07-19 ENCOUNTER — Encounter: Payer: Self-pay | Admitting: Family Medicine

## 2018-07-19 VITALS — BP 158/90 | HR 96 | Temp 98.8°F | Resp 20 | Ht 70.5 in | Wt >= 6400 oz

## 2018-07-19 DIAGNOSIS — E669 Obesity, unspecified: Secondary | ICD-10-CM

## 2018-07-19 DIAGNOSIS — I10 Essential (primary) hypertension: Secondary | ICD-10-CM | POA: Diagnosis not present

## 2018-07-19 DIAGNOSIS — G8929 Other chronic pain: Secondary | ICD-10-CM | POA: Diagnosis not present

## 2018-07-19 DIAGNOSIS — E785 Hyperlipidemia, unspecified: Secondary | ICD-10-CM | POA: Diagnosis not present

## 2018-07-19 DIAGNOSIS — R490 Dysphonia: Secondary | ICD-10-CM | POA: Diagnosis not present

## 2018-07-19 DIAGNOSIS — R7303 Prediabetes: Secondary | ICD-10-CM

## 2018-07-19 DIAGNOSIS — M5442 Lumbago with sciatica, left side: Secondary | ICD-10-CM | POA: Diagnosis not present

## 2018-07-19 DIAGNOSIS — M5441 Lumbago with sciatica, right side: Secondary | ICD-10-CM | POA: Diagnosis not present

## 2018-07-19 DIAGNOSIS — Z23 Encounter for immunization: Secondary | ICD-10-CM | POA: Diagnosis not present

## 2018-07-19 DIAGNOSIS — Z6841 Body Mass Index (BMI) 40.0 and over, adult: Secondary | ICD-10-CM | POA: Diagnosis not present

## 2018-07-19 DIAGNOSIS — E1169 Type 2 diabetes mellitus with other specified complication: Secondary | ICD-10-CM | POA: Diagnosis not present

## 2018-07-19 DIAGNOSIS — J029 Acute pharyngitis, unspecified: Secondary | ICD-10-CM | POA: Diagnosis not present

## 2018-07-19 MED ORDER — LISINOPRIL 10 MG PO TABS: 10.0000 mg | ORAL_TABLET | Freq: Every day | ORAL | 6 refills | Status: DC

## 2018-07-19 MED ORDER — HYDROCODONE-ACETAMINOPHEN 5-325 MG PO TABS: 1.0000 | ORAL_TABLET | Freq: Four times a day (QID) | ORAL | 0 refills | Status: DC | PRN

## 2018-07-19 NOTE — Patient Instructions (Addendum)
It was good to see you today - I will be in touch with your labs asap  We will set you up to see an ENT and I will get a CT scan of your neck area.  We will restart lisinopril for your BP; on recheck it looks ok, but we should continue to use it I think   Flu shot given today I refilled your hydrocodone today as well for your back pain  Take care and let me know if any questions, I will be in touch with your labs asap

## 2018-07-20 ENCOUNTER — Encounter: Payer: Self-pay | Admitting: Family Medicine

## 2018-07-20 ENCOUNTER — Other Ambulatory Visit: Payer: Self-pay | Admitting: Family Medicine

## 2018-07-20 DIAGNOSIS — E1169 Type 2 diabetes mellitus with other specified complication: Secondary | ICD-10-CM | POA: Insufficient documentation

## 2018-07-20 DIAGNOSIS — R221 Localized swelling, mass and lump, neck: Secondary | ICD-10-CM

## 2018-07-20 DIAGNOSIS — E669 Obesity, unspecified: Secondary | ICD-10-CM

## 2018-07-20 HISTORY — DX: Type 2 diabetes mellitus with other specified complication: E11.69

## 2018-07-20 HISTORY — DX: Type 2 diabetes mellitus with other specified complication: E66.9

## 2018-07-20 LAB — BASIC METABOLIC PANEL
BUN: 16 mg/dL (ref 6–23)
CHLORIDE: 103 meq/L (ref 96–112)
CO2: 30 meq/L (ref 19–32)
Calcium: 9.4 mg/dL (ref 8.4–10.5)
Creatinine, Ser: 1.2 mg/dL (ref 0.40–1.50)
GFR: 68 mL/min (ref >60.00)
Glucose, Bld: 131 mg/dL — ABNORMAL HIGH (ref 70–99)
Potassium: 4.6 mEq/L (ref 3.5–5.1)
Sodium: 143 mEq/L (ref 135–145)

## 2018-07-20 LAB — CBC
HEMATOCRIT: 45.5 % (ref 39.0–52.0)
HEMOGLOBIN: 15.1 g/dL (ref 13.0–17.0)
MCHC: 33.2 g/dL (ref 30.0–36.0)
MCV: 85.5 fl (ref 78.0–100.0)
Platelets: 297 10*3/uL (ref 150.0–400.0)
RBC: 5.32 Mil/uL (ref 4.22–5.81)
RDW: 13.9 % (ref 11.5–15.5)
WBC: 10.6 10*3/uL — AB (ref 4.0–10.5)

## 2018-07-20 LAB — LIPID PANEL
CHOL/HDL RATIO: 3
CHOLESTEROL: 137 mg/dL (ref 0–200)
HDL: 41.3 mg/dL (ref >39.00)
NonHDL: 95.78
Triglycerides: 248 mg/dL — ABNORMAL HIGH (ref 0.0–149.0)
VLDL: 49.6 mg/dL — ABNORMAL HIGH (ref 0.0–40.0)

## 2018-07-20 LAB — HEMOGLOBIN A1C: Hgb A1c MFr Bld: 6.5 % (ref 4.6–6.5)

## 2018-07-20 LAB — LDL CHOLESTEROL, DIRECT: Direct LDL: 71 mg/dL

## 2018-07-20 NOTE — Progress Notes (Signed)
Called radiology- CT neck with is indicated.  Ordered this for pt

## 2018-07-21 LAB — CULTURE, GROUP A STREP
MICRO NUMBER:: 91550792
SPECIMEN QUALITY: ADEQUATE

## 2018-07-24 ENCOUNTER — Encounter (HOSPITAL_BASED_OUTPATIENT_CLINIC_OR_DEPARTMENT_OTHER): Payer: Self-pay

## 2018-07-24 ENCOUNTER — Encounter: Payer: Self-pay | Admitting: Family Medicine

## 2018-07-24 ENCOUNTER — Ambulatory Visit (HOSPITAL_BASED_OUTPATIENT_CLINIC_OR_DEPARTMENT_OTHER)
Admission: RE | Admit: 2018-07-24 | Discharge: 2018-07-24 | Disposition: A | Discharge status: Home / Self Care | Payer: Medicare Other | Source: Ambulatory Visit | Attending: Family Medicine | Admitting: Family Medicine

## 2018-07-24 DIAGNOSIS — R221 Localized swelling, mass and lump, neck: Secondary | ICD-10-CM | POA: Insufficient documentation

## 2018-07-24 DIAGNOSIS — M542 Cervicalgia: Secondary | ICD-10-CM | POA: Diagnosis not present

## 2018-07-24 MED ORDER — IOPAMIDOL (ISOVUE-300) INJECTION 61%
100.0000 mL | Freq: Once | INTRAVENOUS | Status: AC | PRN
  Administered 2018-07-24: 100 mL via INTRAVENOUS

## 2018-07-29 DIAGNOSIS — G8929 Other chronic pain: Secondary | ICD-10-CM

## 2018-07-29 DIAGNOSIS — M5441 Lumbago with sciatica, right side: Principal | ICD-10-CM

## 2018-07-29 DIAGNOSIS — M5442 Lumbago with sciatica, left side: Principal | ICD-10-CM

## 2018-07-30 ENCOUNTER — Other Ambulatory Visit: Payer: Self-pay

## 2018-07-31 NOTE — Telephone Encounter (Signed)
-----   Message from Pearline Cables, MD sent at 07/31/2018  8:14 AM EST ----- Call [facility] re his hydrocodone

## 2018-08-02 DIAGNOSIS — Z6841 Body Mass Index (BMI) 40.0 and over, adult: Secondary | ICD-10-CM | POA: Diagnosis not present

## 2018-08-02 DIAGNOSIS — K210 Gastro-esophageal reflux disease with esophagitis: Secondary | ICD-10-CM | POA: Diagnosis not present

## 2018-08-02 DIAGNOSIS — F17290 Nicotine dependence, other tobacco product, uncomplicated: Secondary | ICD-10-CM | POA: Diagnosis not present

## 2018-08-02 DIAGNOSIS — I10 Essential (primary) hypertension: Secondary | ICD-10-CM | POA: Diagnosis not present

## 2018-08-15 ENCOUNTER — Other Ambulatory Visit: Payer: Self-pay | Admitting: Family Medicine

## 2018-08-15 DIAGNOSIS — F325 Major depressive disorder, single episode, in full remission: Secondary | ICD-10-CM

## 2018-09-18 ENCOUNTER — Other Ambulatory Visit: Payer: Self-pay | Admitting: Family Medicine

## 2018-09-18 DIAGNOSIS — F325 Major depressive disorder, single episode, in full remission: Secondary | ICD-10-CM

## 2018-09-20 NOTE — Telephone Encounter (Signed)
07/31/2018  1   07/19/2018  Hydrocodone-Acetamin 5-325 MG  20.00 5 Je Cop   [id number]   [facility] ([id number])   0  20.00 MME  Comm Ins   [place]  06/16/2018  1   01/06/2018  Diazepam 5 MG Tablet  15.00 5 Je Cop   [id number]   [facility] ([id number])   1  1.50 LME  Private Pay   [place]  02/15/2018  1   02/15/2018  Hydrocodone-Acetamin 5-325 MG  20.00 5 Je Cop   [id number]   [facility] ([id number])   0  20.00 MME  Comm Ins   [place]  01/06/2018  1   01/06/2018  Diazepam 5 MG Tablet  15.00 15 Je Cop   [id number]   Wal ([id number])   0  0.50 LME  Private Pay   [place]  01/06/2018  1   01/06/2018  Diazepam 5 MG Tablet  15.00 5 Je Cop   [id number]   [facility] ([id number])   0  1.50 LME  Private Pay   [place]  10/19/2017  1   10/19/2017  Diazepam 2 MG Tablet  20.00 7 Je Cop   [id number]   [facility] (3540)   0  0.57 LME  Private Pay   [place]  08/31/2017  1   08/31/2017  Hydrocodone-Acetamin 5-325 MG  20.00 5 Je Cop   [id number]   [facility] ([id number])   0  20.00 MME  Private Pay   [place]  05/05/2017  1   04/30/2017  Hydrocodone-Acetamin 5-325 MG  15.00 4 Je Cop   [id number]   [facility] ([id number])   0  18.75 MME  Private Pay   [place]   Last visit here in December Okay to refill his Valium

## 2018-10-04 ENCOUNTER — Encounter: Payer: Self-pay | Admitting: Family Medicine

## 2018-11-23 ENCOUNTER — Other Ambulatory Visit: Payer: Self-pay | Admitting: Family Medicine

## 2018-11-23 DIAGNOSIS — F325 Major depressive disorder, single episode, in full remission: Secondary | ICD-10-CM

## 2018-11-23 MED ORDER — CITALOPRAM HYDROBROMIDE 40 MG PO TABS: 40.0000 mg | ORAL_TABLET | Freq: Every day | ORAL | 1 refills | Status: DC

## 2018-11-23 NOTE — Telephone Encounter (Signed)
Copied from CRM [id number]. Topic: Quick Communication - Rx Refill/Question >> Nov 23, 2018 12:42 PM Wyonia Hough E wrote: Medication: citalopram (CELEXA) 40 MG tablet  Yes- pharmacy called this in  Preferred Pharmacy (with phone number or street name): [facility], [place] - [address] [address] [phone] (Phone) [phone] (Fax)    Agent: Please be advised that RX refills may take up to 3 business days. We ask that you follow-up with your pharmacy.

## 2018-11-23 NOTE — Telephone Encounter (Signed)
Rx sent 

## 2018-11-28 ENCOUNTER — Encounter: Payer: Self-pay | Admitting: Family Medicine

## 2018-11-29 NOTE — Progress Notes (Signed)
[facility] [address] [place], [place] [address] [phone]  Date:  12/01/2018   Name:  Timothy Solis   DOB:  1966/03/17   MRN:  [id number]  PCP:  Pearline Cables, MD    Chief Complaint: No chief complaint on file.   History of Present Illness:  Timothy Solis is a 53 y.o. very pleasant male patient who presents with the following:  Virtual visit today due to pandemic Patient location is home Provider location is office Patient identity confirmed with name date of birth  I last saw him around new years   Vibhav suffers from diabetes, obesity, depression, and chronic fatigue syndrome He recently sent me a scientific article that he found, which explored and the link between immune system problems and retro-viruses in chronic fatigue syndrome; a link follows [email or web address]  Lab Results  Component Value Date   HGBA1C 6.5 07/19/2018   Nels notes that since our last visit he stopped eating gluten.  He did not have high hopes for this, but notes that he really feels better since making this dietary change this really helped with his allergies, he is not longer having to take allergy meds- stopped his allegra and flonase  He also tried some CBD at home. This has helped with his joint pains/ neuropathy, and he was able to stop using gabapentin and also hydrocodone He bought a home TENs unit and will use it for nerve pain in his legs- about 30 minutes of treatment is really helpful for him He has not suffered from much pain over the last 2 months  Rarely needs flexeril for muscle spasm  He has noted improvement in his mood- both depression and anxiety have been better.  Mood seems stable  He stopped taking wellbutrin but is still taking celexa  Positive Cologuard last year- he  never did see GI or have a colonoscopy.  This got delayed by various factors.  I will re-refer him today At our last visit he had concern of sore throat and hoarse voice, I referred him to ENT and also got a CT of his neck which was negative for any malignancy but did show significant dental disease I referred him to ENT at that time as well- they thought his sx were due to acid reflux damaging his vocal cords They have him on omeprazole 40; he also angled his bed frame to help with acid.  Sx are improved Lisinopril 10 crestor 20 He is actually not taking metformin- we had started this for mild diabetes, but he did not continue to take it  He notes that his chronic fatigue seems to be getting worse.  He has fallen about 8x this year so far He has a cane but this is annoying to use at home  Sometimes he will stand up and find that his leg does not support him and he falls Usually the left leg will seem to get out, occasionally the right He also has some burning in his feet sometimes He finds that he is dropping objects more often over the last 2-3 months He will fell like he has a good grip on things, but then he might drop for example a glass  He feels like his overall energy level is also decreased He might need a nap in the afternoon; sometimes he is not sleepy but is physically tired and has to lie down on the bed  He notes a strong family history of MS, his father and 1 brother had this disease  He gets tired really easily with any physical activity and has to take a lot of breaks Negative cardiac stress last year so we do not think this is heart related  Lab Results  Component Value Date   HGBA1C 6.5 07/19/2018   Due for foot exam, eye exam pneumonia vaccine Most recent labs performed in December  See further details regarding blood pressure readings below.  He notes that his blood pressures running a bit above goal.  Currently taking lisinopril 10 mg  Patient Active Problem List    Diagnosis Date Noted  . Diabetes mellitus type 2 in obese (HCC) 07/20/2018  . Costochondritis 09/14/2017  . Dyslipidemia 08/30/2017  . IBS (irritable bowel syndrome) 11/05/2015  . Morbid obesity (HCC) 11/05/2015  . Major depressive disorder with single episode, in remission (HCC) 11/05/2015  . Chronic fatigue 11/05/2015  . Allergic rhinitis, cause unspecified 04/01/2013  . Unspecified asthma(493.90) 04/01/2013    Past Medical History:  Diagnosis Date  . Allergy   . Chronic fatigue syndrome   . Depression   . GERD (gastroesophageal reflux disease)   . HTN (hypertension)   . IBS (irritable bowel syndrome)   . IBS (irritable bowel syndrome)   . Nephrolithiasis   . Obesity     Past Surgical History:  Procedure Laterality Date  . BACK SURGERY    . CHOLECYSTECTOMY    . KNEE SURGERY      Social History   Tobacco Use  . Smoking status: Current Some Day Smoker    Types: Cigars  . Smokeless tobacco: Never Used  Substance Use Topics  . Alcohol use: Yes    Comment: occasional  . Drug use: Never    Family History  Problem Relation Age of Onset  . Coronary artery disease Other   . Cancer Other   . Heart disease Mother   . Multiple sclerosis Father   . Cancer Brother   . Multiple sclerosis Brother   . Heart disease Maternal Grandmother   . Cancer Maternal Grandfather   . Multiple sclerosis Brother     Allergies  Allergen Reactions  . Ceclor [Cefaclor]     Can't remember what reaction had  . Oxycodone Itching  . Prednisone     Medication list has been reviewed and updated.  Current Outpatient Medications on File Prior to Visit  Medication Sig Dispense Refill  . ALBUTEROL IN Inhale into the lungs. Reported on 11/05/2015    . buPROPion (WELLBUTRIN SR) 100 MG 12 hr tablet Take 1 tablet (100 mg total) by mouth 2 (two) times daily. 60 tablet 3  . citalopram (CELEXA) 40 MG tablet Take 1 tablet (40 mg total) by mouth daily. 90 tablet 1  . colestipol (COLESTID) 1 g  tablet Take 1 g by mouth as needed.    . cyclobenzaprine (FLEXERIL) 10 MG tablet TAKE 1 TABLET BY MOUTH BY MOUTH AS NEEDED FOR MUSCLE SPASM 30 tablet 2  . diazepam (VALIUM) 5 MG tablet TAKE 1 TABLET BY MOUTH EVERY 8 HOURS AS NEEDED FOR ANXIETY 15 tablet 2  . dicyclomine (BENTYL) 20 MG tablet TAKE 1 TABLET BY MOUTH TWICE DAILY 180 tablet 3  . DiphenhydrAMINE HCl (BENADRYL ALLERGY PO) Take by mouth. Prn       . fexofenadine (ALLEGRA) 180 MG tablet Take 180 mg by mouth daily.    . fluticasone (FLONASE) 50 MCG/ACT nasal spray Place 2 sprays into  the nose daily. 1 g 5  . gabapentin (NEURONTIN) 300 MG capsule TAKE 1 CAPSULE BY MOUTH THREE TIMES DAILY 270 capsule 1  . HYDROcodone-acetaminophen (NORCO/VICODIN) 5-325 MG tablet Take 1 tablet by mouth every 6 (six) hours as needed for moderate pain. 20 tablet 0  . lisinopril (PRINIVIL,ZESTRIL) 10 MG tablet Take 1 tablet (10 mg total) by mouth daily. 30 tablet 6  . metFORMIN (GLUCOPHAGE) 500 MG tablet Take 1 tablet (500 mg total) by mouth daily. 90 tablet 3  . rosuvastatin (CRESTOR) 20 MG tablet Take 1 tablet (20 mg total) by mouth daily. 90 tablet 3   No current facility-administered medications on file prior to visit.     Review of Systems:  As per HPI- otherwise negative. No symptoms of acute illness, no cough or fever  Physical Examination: There were no vitals filed for this visit. There were no vitals filed for this visit. There is no height or weight on file to calculate BMI. Ideal Body Weight:    Pt observed on video today He looks well, his normal self, obese BP today was 147/78 P 89 Other arm 149/80, P 90  BP Readings from Last 3 Encounters:  07/19/18 (!) 158/90  02/15/18 132/78  12/21/17 (!) 144/80     Assessment and Plan: Chronic fatigue - Plan: Ambulatory referral to Neurology  Essential hypertension - Plan: lisinopril (ZESTRIL) 20 MG tablet  Dyslipidemia - Plan: rosuvastatin (CRESTOR) 20 MG tablet  Positive colorectal  cancer screening using Cologuard test - Plan: Ambulatory referral to Gastroenterology  Frequent falls - Plan: Ambulatory referral to Neurology  Family history of MS (multiple sclerosis) - Plan: Ambulatory referral to Neurology  Virtual visit today, during which we discussed several topics. Loletta SpecterCorbin has recently stopped eating gluten which seems to have had several positive effects.  His allergies and chronic pains in the better However he never did follow-up on positive Cologuard last year.  I will re-refer him to gastroenterology to address this; it also seems possible that he might have celiac disease, which could also be confirmed on colonoscopy  Despite these positive changes, he is also having concerning symptoms of increased weakness.  He had thought this was all chronic fatigue, but I am concerned that he is dropping items in his legs seem to give way underneath him.  There is a strong family history of MS.  Will refer to neurology for further evaluation  We increased lisinopril to 20 mg due to borderline blood pressure He does not want to come in for labs right now due to risk of infection-this is fine, we do not emergently need labs He does plan to start the process for weight loss surgery  I spoke with the patient on the phone for 34 minutes today.  This is in addition to other care coordination and planning Signed Abbe AmsterdamJessica , MD

## 2018-12-01 ENCOUNTER — Ambulatory Visit (INDEPENDENT_AMBULATORY_CARE_PROVIDER_SITE_OTHER): Payer: Medicare Other | Admitting: Family Medicine

## 2018-12-01 ENCOUNTER — Encounter: Payer: Self-pay | Admitting: Family Medicine

## 2018-12-01 ENCOUNTER — Other Ambulatory Visit: Payer: Self-pay

## 2018-12-01 DIAGNOSIS — E785 Hyperlipidemia, unspecified: Secondary | ICD-10-CM | POA: Diagnosis not present

## 2018-12-01 DIAGNOSIS — R195 Other fecal abnormalities: Secondary | ICD-10-CM | POA: Diagnosis not present

## 2018-12-01 DIAGNOSIS — R5382 Chronic fatigue, unspecified: Secondary | ICD-10-CM

## 2018-12-01 DIAGNOSIS — Z820 Family history of epilepsy and other diseases of the nervous system: Secondary | ICD-10-CM | POA: Diagnosis not present

## 2018-12-01 DIAGNOSIS — I10 Essential (primary) hypertension: Secondary | ICD-10-CM | POA: Diagnosis not present

## 2018-12-01 DIAGNOSIS — R296 Repeated falls: Secondary | ICD-10-CM | POA: Diagnosis not present

## 2018-12-01 MED ORDER — ROSUVASTATIN CALCIUM 20 MG PO TABS: 20.0000 mg | ORAL_TABLET | Freq: Every day | ORAL | 3 refills | Status: DC

## 2018-12-01 MED ORDER — LISINOPRIL 20 MG PO TABS: 20.0000 mg | ORAL_TABLET | Freq: Every day | ORAL | 3 refills | Status: DC

## 2018-12-01 NOTE — Patient Instructions (Addendum)
It was nice to talk to you today!   I placed referrals to GI (to discus colonoscopy and ?celiac disease) and also neurology to discuss your other symptoms.  We may need to rule out MS Refilled crestor for cholesterol Refilled lisinopril but changed to 20 mg for BP control   I would like to eventually get routine lab work for you.  If you happen to be getting labs for another doctor, please let me know and I can order my labs as well.  Please let me know if any questions or concerns.  Let us try to visit in the office sometime this fall, if possible

## 2018-12-06 ENCOUNTER — Encounter: Payer: Self-pay | Admitting: *Deleted

## 2018-12-06 ENCOUNTER — Telehealth: Payer: Self-pay | Admitting: *Deleted

## 2018-12-06 NOTE — Telephone Encounter (Signed)
Called, LVM for pt to call office for me to update his chart before in office visit tomorrow.

## 2018-12-06 NOTE — Addendum Note (Signed)
Addended by: Hillis Range on: 12/06/2018 04:45 PM   Modules accepted: Orders

## 2018-12-06 NOTE — Telephone Encounter (Signed)
I called pt. Updated med list/pharmacy/allergies/history on file. He has not had any recent MRI brain/cervical spine. Been more than 8 yrs per pt. Advised him to check in 1pm. Explained new check in process.

## 2018-12-07 ENCOUNTER — Ambulatory Visit: Payer: Medicare Other | Admitting: Neurology

## 2018-12-14 ENCOUNTER — Other Ambulatory Visit: Payer: Self-pay

## 2018-12-14 ENCOUNTER — Encounter: Payer: Self-pay | Admitting: Neurology

## 2018-12-14 ENCOUNTER — Ambulatory Visit (INDEPENDENT_AMBULATORY_CARE_PROVIDER_SITE_OTHER): Payer: Medicare Other | Admitting: Neurology

## 2018-12-14 ENCOUNTER — Telehealth: Payer: Self-pay | Admitting: Neurology

## 2018-12-14 VITALS — BP 153/87 | HR 86 | Temp 98.2°F | Ht 70.0 in | Wt >= 6400 oz

## 2018-12-14 DIAGNOSIS — R269 Unspecified abnormalities of gait and mobility: Secondary | ICD-10-CM

## 2018-12-14 DIAGNOSIS — Z9989 Dependence on other enabling machines and devices: Secondary | ICD-10-CM | POA: Diagnosis not present

## 2018-12-14 DIAGNOSIS — R200 Anesthesia of skin: Secondary | ICD-10-CM

## 2018-12-14 DIAGNOSIS — R5383 Other fatigue: Secondary | ICD-10-CM | POA: Diagnosis not present

## 2018-12-14 DIAGNOSIS — F325 Major depressive disorder, single episode, in full remission: Secondary | ICD-10-CM

## 2018-12-14 DIAGNOSIS — R202 Paresthesia of skin: Secondary | ICD-10-CM

## 2018-12-14 DIAGNOSIS — E559 Vitamin D deficiency, unspecified: Secondary | ICD-10-CM | POA: Diagnosis not present

## 2018-12-14 DIAGNOSIS — G3281 Cerebellar ataxia in diseases classified elsewhere: Secondary | ICD-10-CM | POA: Diagnosis not present

## 2018-12-14 DIAGNOSIS — E1142 Type 2 diabetes mellitus with diabetic polyneuropathy: Secondary | ICD-10-CM

## 2018-12-14 DIAGNOSIS — R5382 Chronic fatigue, unspecified: Secondary | ICD-10-CM | POA: Diagnosis not present

## 2018-12-14 DIAGNOSIS — G4733 Obstructive sleep apnea (adult) (pediatric): Secondary | ICD-10-CM

## 2018-12-14 HISTORY — DX: Obstructive sleep apnea (adult) (pediatric): G47.33

## 2018-12-14 HISTORY — DX: Paresthesia of skin: R20.2

## 2018-12-14 HISTORY — DX: Type 2 diabetes mellitus with diabetic polyneuropathy: E11.42

## 2018-12-14 MED ORDER — ARMODAFINIL 250 MG PO TABS: 250.0000 mg | ORAL_TABLET | Freq: Every day | ORAL | 5 refills | Status: DC

## 2018-12-14 NOTE — Telephone Encounter (Signed)
Medicare order sent to GI. No auth they will reach out to the patient to schedule.  

## 2018-12-14 NOTE — Progress Notes (Signed)
[facility]  PATIENT: Timothy Solis DOB: 1965-11-07  REFERRING DOCTOR OR PCP: Lamar Blinks, MD SOURCE: Patient, notes from PCP, laboratory reports,  _________________________________   HISTORICAL  CHIEF COMPLAINT:  Chief Complaint  Patient presents with  . New Patient (Initial Visit)    RM 13, alone. Internal referral for chronic fatigue, frequent falls, family hx MS.   . Gait Problem    Ambulates with cane. Does not use all the time. Uses mostly around the house or when he goes outside  . Fall    Has had about 4 falls in the last year. About a dozen near falls per pt.   . Eye Problem    Has been about 5-6 years since he has had an eye exam, updated prescription for glasses.     HISTORY OF PRESENT ILLNESS:  I had the pleasure of seeing your patient, Timothy Solis, at [facility] for neurologic consultation regarding his gait disturbance, fatigue and family history of MS.  He is a 53 year old man who has had issues with chronic fatigue and gait for the past few years. He has had chronic fatigue x 7-8 years after he had a pneumonia.   He was seen in the ED but not admitted.   Due to his continued symptoms, he was diagnosed with chronic fatigue syndrome.    He was not placed on any medications.  Over the last year, balance is worse and he is relying more on his cane.  He does have back pain.  Before last year, cane use was more intermittent, especially if he anticipated longer walks.   He did not use it in the home until the past year.    He stumbles and has had some falls.   Some falls occur when he loses his balance and can't quickly correct.   Over falls occur due to the left leg collapsing.   He feels the leg is not working but after a minute or so, he feels back to baseline and is walking again (with cane).     He notes tingling in his legs and sometimes gets a hot sensation on the bottom of the left foot. Marland Kitchen    He denies any bladder  frequency.   He denies problems with vision in general but sometimes gets lightheaded and notes a mild blacking out of vision.     He has had fatigue x 8 years.    He is sleeping 12 hours a day.   He often takes a nap in the afternoons,   He sleeps with a CPAP, for OSA diagnosed 6 years ago.   He felt fatigue and sleepiness improved but still has symptoms.   He has some daytime sleepiness  EPWORTH SLEEPINESS SCALE  On a scale of 0 - 3 what is the chance of dozing:  Sitting and Reading:   3 Watching TV:    3 Sitting inactive in a public place: 0 Passenger in car for one hour: 0 (might happen but no recent passenger) Lying down to rest in the afternoon: 3 Sitting and talking to someone: 0 Sitting quietly after lunch:  1 In a car, stopped in traffic:  0 (does not drive)  Total (out of 24):   10 / 24   Mild sleepiness   Laboratory reports were reviewed.  His diabetes mellitus has been well controlled with hemoglobin A1c's typically of 6.5.  CBC and chemistries were otherwise fine.  Recent CT scan of the neck showed  chronic cervical spondylosis.  There were no masses.  He has had depression in the past but is doing well on citalopram and CBD.    His father and maternal uncle and younger brother all had/have MS.    His brother was diagnosed around age 87.     REVIEW OF SYSTEMS: Constitutional: No fevers, chills, sweats, or change in appetite.  He has fatigue and sleepiness.  He has morbid obesity Eyes: No visual changes, double vision, eye pain Ear, nose and throat: No hearing loss, ear pain, nasal congestion, sore throat Cardiovascular: No chest pain, palpitations Respiratory: No shortness of breath at rest or with exertion.   No wheezes GastrointestinaI: No nausea, vomiting, diarrhea, abdominal pain, fecal incontinence Genitourinary: No dysuria, urinary retention or frequency.  No nocturia. Musculoskeletal: No neck pain, notes some back pain Integumentary: No rash, pruritus, skin  lesions Neurological: as above Psychiatric: No depression at this time but had major depression in the past.  No anxiety Endocrine: No palpitations, diaphoresis, change in appetite, change in weigh or increased thirst.  Diabetes is well controlled. Hematologic/Lymphatic: No anemia, purpura, petechiae. Allergic/Immunologic: No itchy/runny eyes, nasal congestion, recent allergic reactions, rashes  ALLERGIES: Allergies  Allergen Reactions  . Ceclor [Cefaclor]     Can't remember what reaction had  . Oxycodone Itching  . Prednisone     HOME MEDICATIONS:  Current Outpatient Medications:  .  citalopram (CELEXA) 40 MG tablet, Take 1 tablet (40 mg total) by mouth daily., Disp: 90 tablet, Rfl: 1 .  cyclobenzaprine (FLEXERIL) 10 MG tablet, TAKE 1 TABLET BY MOUTH BY MOUTH AS NEEDED FOR MUSCLE SPASM, Disp: 30 tablet, Rfl: 2 .  diazepam (VALIUM) 5 MG tablet, TAKE 1 TABLET BY MOUTH EVERY 8 HOURS AS NEEDED FOR ANXIETY, Disp: 15 tablet, Rfl: 2 .  dicyclomine (BENTYL) 20 MG tablet, TAKE 1 TABLET BY MOUTH TWICE DAILY, Disp: 180 tablet, Rfl: 3 .  HYDROcodone-acetaminophen (NORCO/VICODIN) 5-325 MG tablet, Take 1 tablet by mouth every 6 (six) hours as needed for moderate pain., Disp: 20 tablet, Rfl: 0 .  lisinopril (ZESTRIL) 20 MG tablet, Take 1 tablet (20 mg total) by mouth daily., Disp: 90 tablet, Rfl: 3 .  rosuvastatin (CRESTOR) 20 MG tablet, Take 1 tablet (20 mg total) by mouth daily., Disp: 90 tablet, Rfl: 3 .  Armodafinil 250 MG tablet, Take 1 tablet (250 mg total) by mouth daily., Disp: 30 tablet, Rfl: 5  PAST MEDICAL HISTORY: Past Medical History:  Diagnosis Date  . Allergy   . Chronic fatigue syndrome   . Depression   . GERD (gastroesophageal reflux disease)   . HTN (hypertension)   . IBS (irritable bowel syndrome)   . IBS (irritable bowel syndrome)   . Nephrolithiasis   . Obesity     PAST SURGICAL HISTORY: Past Surgical History:  Procedure Laterality Date  . BACK SURGERY    .  CHOLECYSTECTOMY    . KNEE SURGERY      FAMILY HISTORY: Family History  Problem Relation Age of Onset  . Coronary artery disease Other   . Cancer Other   . Heart disease Mother   . Multiple sclerosis Father   . Cancer Brother   . Multiple sclerosis Brother   . Heart disease Maternal Grandmother   . Cancer Maternal Grandfather   . Multiple sclerosis Brother   . Multiple sclerosis Maternal Uncle     SOCIAL HISTORY:  Social History   Socioeconomic History  . Marital status: Divorced    Spouse name: Not  on file  . Number of children: 0  . Years of education: 55  . Highest education level: Not on file  Occupational History  . Occupation: Disability  Social Needs  . Financial resource strain: Not on file  . Food insecurity:    Worry: Not on file    Inability: Not on file  . Transportation needs:    Medical: Not on file    Non-medical: Not on file  Tobacco Use  . Smoking status: Current Some Day Smoker    Types: Cigars  . Smokeless tobacco: Never Used  Substance and Sexual Activity  . Alcohol use: Yes    Comment: occasional- rare  . Drug use: Never  . Sexual activity: Not Currently  Lifestyle  . Physical activity:    Days per week: Not on file    Minutes per session: Not on file  . Stress: Not on file  Relationships  . Social connections:    Talks on phone: Not on file    Gets together: Not on file    Attends religious service: Not on file    Active member of club or organization: Not on file    Attends meetings of clubs or organizations: Not on file    Relationship status: Not on file  . Intimate partner violence:    Fear of current or ex partner: Not on file    Emotionally abused: Not on file    Physically abused: Not on file    Forced sexual activity: Not on file  Other Topics Concern  . Not on file  Social History Narrative   Right handed    Caffeine use: Coffee/tea/soda daily   Lives alone     PHYSICAL EXAM  Vitals:   12/14/18 1010  BP: (!)  153/87  Pulse: 86  Temp: 98.2 F (36.8 C)  Weight: (!) 406 lb (184.2 kg)  Height: '5\' 10"'$  (1.778 m)    Body mass index is 58.25 kg/m.   General: The patient is a well-developed obese man and in no acute distress  Eyes:  Funduscopic exam shows normal optic discs and retinal vessels.  Neck: The neck is supple, no carotid bruits are noted.  The neck is nontender.  Cardiovascular: The heart has a regular rate and rhythm with a normal S1 and S2. There were no murmurs, gallops or rubs. Lungs are clear to auscultation.  Skin: Extremities are without significant edema.  Musculoskeletal:  Back is nontender  Neurologic Exam  Mental status: The patient is alert and oriented x 3 at the time of the examination. The patient has apparent normal recent and remote memory, with an apparently normal attention span and concentration ability.   Speech is normal.  Cranial nerves: Extraocular movements are full. Pupils are equal, round, and reactive to light and accomodation.  Visual fields are full.  Facial symmetry is present. There is good facial sensation to soft touch bilaterally.Facial strength is normal.  Trapezius and sternocleidomastoid strength is normal. No dysarthria is noted.  The tongue is midline, and the patient has symmetric elevation of the soft palate. No obvious hearing deficits are noted.  Motor:  Muscle bulk is normal.   Tone is normal. Strength is  5 / 5 in all 4 extremities.   Sensory: Sensory testing is intact to pinprick, soft touch and vibration sensation in the arms but he has reduced vibration sensation at the toes.  Coordination: Cerebellar testing reveals good finger-nose-finger and heel-to-shin bilaterally.  Gait and station: Station is mildly  wide and the gait is arthritic and mildly wide.  He walks better with his cane.. Romberg is negative.   Reflexes: Deep tendon reflexes are symmetric and normal in the arms and reduced in the legs.   Plantar responses are flexor.     DIAGNOSTIC DATA (LABS, IMAGING, TESTING) - I reviewed patient records, labs, notes, testing and imaging myself where available.  Lab Results  Component Value Date   WBC 10.6 (H) 07/19/2018   HGB 15.1 07/19/2018   HCT 45.5 07/19/2018   MCV 85.5 07/19/2018   PLT 297.0 07/19/2018      Component Value Date/Time   NA 143 07/19/2018 1551   K 4.6 07/19/2018 1551   CL 103 07/19/2018 1551   CO2 30 07/19/2018 1551   GLUCOSE 131 (H) 07/19/2018 1551   BUN 16 07/19/2018 1551   CREATININE 1.20 07/19/2018 1551   CREATININE 0.95 09/10/2011 0920   CALCIUM 9.4 07/19/2018 1551   PROT 7.5 08/31/2017 1359   ALBUMIN 4.1 08/31/2017 1359   AST 21 08/31/2017 1359   ALT 22 08/31/2017 1359   ALKPHOS 94 08/31/2017 1359   BILITOT 0.3 08/31/2017 1359   GFRNONAA 32 (L) 10/09/2009 0827   GFRAA (L) 10/09/2009 0827    38        The eGFR has been calculated using the MDRD equation. This calculation has not been validated in all clinical situations. eGFR's persistently <60 mL/min signify possible Chronic Kidney Disease.   Lab Results  Component Value Date   CHOL 137 07/19/2018   HDL 41.30 07/19/2018   LDLCALC 120 (H) 11/05/2015   LDLDIRECT 71.0 07/19/2018   TRIG 248.0 (H) 07/19/2018   CHOLHDL 3 07/19/2018   Lab Results  Component Value Date   HGBA1C 6.5 07/19/2018      ASSESSMENT AND PLAN  Gait disturbance - Plan: Vitamin B12, CBC with Differential/Platelet, TSH  Cerebellar ataxia in diseases classified elsewhere ([place]) - Plan: MR BRAIN WO CONTRAST, MR CERVICAL SPINE WO CONTRAST  Other fatigue - Plan: Vitamin B12, CBC with Differential/Platelet, TSH  Numbness  Vitamin D deficiency - Plan: VITAMIN D 25 Hydroxy (Vit-D Deficiency, Fractures)  Diabetic polyneuropathy associated with type 2 diabetes mellitus ([place])  Morbid obesity (HCC)  Chronic fatigue  OSA on CPAP  Major depressive disorder with single episode, in remission ([place])  Paresthesia   In summary, Mr. Rho is a  53 year old man with gait disturbance, fatigue and numbness with a strong family history of MS (father and 2 brothers) because of the difficulty with his gait and numbness we need to check an MRI of the brain and cervical spine to determine if he has evidence of demyelination, stroke or myelopathy.  Further evaluation or referral may be necessary based on the results.  Additionally, due to the fatigue we will check some blood work including CBC, TSH, B12, vitamin D.  I will also send in a prescription for new vigil in case the fatigue and sleepiness is due to to his sleep apnea despite adequate treatment with CPAP.  His exam does show mild reduced vibration sensation in the feet that is more likely to be due to diabetic polyneuropathy than to other causes.    We will call with the results of the studies and bring him back in if needed based on the results.  Additionally, he should call us if he has new or worsening neurologic symptoms.   Richard A. Felecia Shelling, MD, [facility] 5/00/9381, 8:29 PM Certified in Neurology, Clinical Neurophysiology, Sleep Medicine, Pain Medicine  and Neuroimaging  [facility] [address] [place], [place] [address] [phone]

## 2018-12-15 LAB — CBC WITH DIFFERENTIAL/PLATELET
Basophils Absolute: 0 10*3/uL (ref 0.0–0.2)
Basos: 0 %
EOS (ABSOLUTE): 0.3 10*3/uL (ref 0.0–0.4)
Eos: 4 %
Hematocrit: 43 % (ref 37.5–51.0)
Hemoglobin: 14.2 g/dL (ref 13.0–17.7)
Immature Grans (Abs): 0.1 10*3/uL (ref 0.0–0.1)
Immature Granulocytes: 1 %
Lymphocytes Absolute: 2.5 10*3/uL (ref 0.7–3.1)
Lymphs: 29 %
MCH: 27.8 pg (ref 26.6–33.0)
MCHC: 33 g/dL (ref 31.5–35.7)
MCV: 84 fL (ref 79–97)
Monocytes Absolute: 0.7 10*3/uL (ref 0.1–0.9)
Monocytes: 8 %
Neutrophils Absolute: 5.1 10*3/uL (ref 1.4–7.0)
Neutrophils: 58 %
Platelets: 257 10*3/uL (ref 150–450)
RBC: 5.1 x10E6/uL (ref 4.14–5.80)
RDW: 13.2 % (ref 11.6–15.4)
WBC: 8.6 10*3/uL (ref 3.4–10.8)

## 2018-12-15 LAB — TSH: TSH: 1.62 u[IU]/mL (ref 0.450–4.500)

## 2018-12-15 LAB — VITAMIN D 25 HYDROXY (VIT D DEFICIENCY, FRACTURES): Vit D, 25-Hydroxy: 21 ng/mL — ABNORMAL LOW (ref 30.0–100.0)

## 2018-12-15 LAB — VITAMIN B12: Vitamin B-12: 241 pg/mL (ref 232–1245)

## 2018-12-16 ENCOUNTER — Telehealth: Payer: Self-pay | Admitting: *Deleted

## 2018-12-16 MED ORDER — VITAMIN D (ERGOCALCIFEROL) 1.25 MG (50000 UNIT) PO CAPS: 50000.0000 [IU] | ORAL_CAPSULE | ORAL | 3 refills | Status: DC

## 2018-12-16 NOTE — Telephone Encounter (Signed)
-----   Message from Asa Lente, MD sent at 12/15/2018 12:45 PM EDT ----- Please let her know that the vitamin D was low.  Please call in 50,000 units weekly #[phone] #[phone].  Vitamin B12 was in the low normal range.  He should take over-the-counter B12.

## 2019-01-07 ENCOUNTER — Encounter: Payer: Self-pay | Admitting: Gastroenterology

## 2019-01-19 ENCOUNTER — Telehealth: Payer: Self-pay

## 2019-01-19 NOTE — Telephone Encounter (Signed)
Thanks for your prompt response. I called the patient to get him scheduled for a televisit but he didn't answer. I will keep trying to reach him to get him scheduled. I think that was the problem last fall, we were never able to reach him and he didn't return our call.   Riki Sheer, LPN ( [place] )

## 2019-01-19 NOTE — Telephone Encounter (Signed)
Hi Lets do televisit before RG

## 2019-01-19 NOTE — Telephone Encounter (Signed)
Dr. Lyndel Safe,  Timothy Solis is a direct schedule for a colonoscopy due to a positive cologuard. In preparing his chart for PV, I noticed that his BMI is 58.25 and his weight is 406.   It seems that last October he was supposed to be scheduled for a hospital colonoscopy, but the patient did not return our call. Do you want to see the patient in the office or do you want a direct hospital colonoscopy? Please advise. Thanks,  Riki Sheer, LPN ( PV )

## 2019-01-20 ENCOUNTER — Telehealth: Payer: Self-pay

## 2019-01-20 NOTE — Telephone Encounter (Signed)
Patient was called to explain why we need to cancel his PV and scheduled colonoscopy. BMI 58.25 and he needs to have his procedure at the hospital. Dr. Lyndel Safe was notified, and wants to schedule a telemedicine over the phone prior to scheduling hospital procedure. Several messages were left but the patient has not responded. I will continue to try to reach this patient.   Riki Sheer, LPN ( PV )

## 2019-01-25 ENCOUNTER — Encounter: Payer: Self-pay | Admitting: Family Medicine

## 2019-01-27 ENCOUNTER — Telehealth: Payer: Medicare Other | Admitting: Physician Assistant

## 2019-01-27 DIAGNOSIS — Z20822 Contact with and (suspected) exposure to covid-19: Secondary | ICD-10-CM

## 2019-01-27 NOTE — Progress Notes (Signed)
I have spent 5 minutes in review of e-visit questionnaire, review and updating patient chart, medical decision making and response to patient.   Antwione Picotte Cody Para Cossey, PA-C    

## 2019-01-27 NOTE — Progress Notes (Signed)
E-Visit for Corona Virus Screening   Your current symptoms could be consistent with the coronavirus.  Call your health care provider or local health department to request and arrange formal testing. Many health care providers can now test patients at their office but not all are.  Please quarantine yourself while awaiting your test results.  [facility] [phone], [facility] [phone], [facility] [phone] or visit [email or web address]  and You have been enrolled in MyChart Home Monitoring for COVID-19.  Daily you will receive a questionnaire within the MyChart website. Our COVID-19 response team will be monitoring your responses daily.    COVID-19 is a respiratory illness with symptoms that are similar to the flu. Symptoms are typically mild to moderate, but there have been cases of severe illness and death due to the virus. The following symptoms may appear 2-14 days after exposure: . Fever . Cough . Shortness of breath or difficulty breathing . Chills . Repeated shaking with chills . Muscle pain . Headache . Sore throat . New loss of taste or smell . Fatigue . Congestion or runny nose . Nausea or vomiting . Diarrhea  It is vitally important that if you feel that you have an infection such as this virus or any other virus that you stay home and away from places where you may spread it to others.  You should self-quarantine for 14 days if you have symptoms that could potentially be coronavirus or have been in close contact a with a person diagnosed with COVID-19 within the last 2 weeks. You should avoid contact with people age 65 and older.   You should wear a mask or cloth face covering over your nose and mouth if you must be around other people or animals, including pets (even at home). Try to stay at least 6 feet away from other people. This will protect the  people around you.   You may also take acetaminophen (Tylenol) as needed for fever.   Reduce your risk of any infection by using the same precautions used for avoiding the common cold or flu:  . Wash your hands often with soap and warm water for at least 20 seconds.  If soap and water are not readily available, use an alcohol-based hand sanitizer with at least 60% alcohol.  . If coughing or sneezing, cover your mouth and nose by coughing or sneezing into the elbow areas of your shirt or coat, into a tissue or into your sleeve (not your hands). . Avoid shaking hands with others and consider head nods or verbal greetings only. . Avoid touching your eyes, nose, or mouth with unwashed hands.  . Avoid close contact with people who are sick. . Avoid places or events with large numbers of people in one location, like concerts or sporting events. . Carefully consider travel plans you have or are making. . If you are planning any travel outside or inside the [place], visit the CDC's Travelers' Health webpage for the latest health notices. . If you have some symptoms but not all symptoms, continue to monitor at home and seek medical attention if your symptoms worsen. . If you are having a medical emergency, call 911.  HOME CARE . Only take medications as instructed by your medical team. . Drink plenty of fluids and get plenty of rest. . A steam or ultrasonic humidifier can help if you have congestion.   GET HELP RIGHT AWAY IF YOU HAVE EMERGENCY WARNING SIGNS** FOR COVID-19. If   you or someone is showing any of these signs seek emergency medical care immediately. Call 911 or proceed to your closest emergency facility if: . You develop worsening high fever. . Trouble breathing . Bluish lips or face . Persistent pain or pressure in the chest . New confusion . Inability to wake or stay awake . You cough up blood. . Your symptoms become more severe  **This list is not all possible symptoms. Contact your  medical provider for any symptoms that are sever or concerning to you.   MAKE SURE YOU   Understand these instructions.  Will watch your condition.  Will get help right away if you are not doing well or get worse.  Your e-visit answers were reviewed by a board certified advanced clinical practitioner to complete your personal care plan.  Depending on the condition, your plan could have included both over the counter or prescription medications.  If there is a problem please reply once you have received a response from your provider.  Your safety is important to us.  If you have drug allergies check your prescription carefully.    You can use MyChart to ask questions about today's visit, request a non-urgent call back, or ask for a work or school excuse for 24 hours related to this e-Visit. If it has been greater than 24 hours you will need to follow up with your provider, or enter a new e-Visit to address those concerns. You will get an e-mail in the next two days asking about your experience.  I hope that your e-visit has been valuable and will speed your recovery. Thank you for using e-visits.    

## 2019-01-28 ENCOUNTER — Other Ambulatory Visit: Payer: Medicare Other

## 2019-02-03 ENCOUNTER — Telehealth: Payer: Self-pay | Admitting: Family Medicine

## 2019-02-03 NOTE — Telephone Encounter (Signed)
Spoke with Mr. Timothy Solis regarding AWV. Patient will give office a call back when ready to schedule. SF

## 2019-02-08 ENCOUNTER — Encounter: Payer: Medicare Other | Admitting: Gastroenterology

## 2019-02-11 ENCOUNTER — Telehealth (INDEPENDENT_AMBULATORY_CARE_PROVIDER_SITE_OTHER): Payer: Medicare Other | Admitting: Gastroenterology

## 2019-02-11 ENCOUNTER — Encounter: Payer: Self-pay | Admitting: Gastroenterology

## 2019-02-11 ENCOUNTER — Other Ambulatory Visit: Payer: Self-pay

## 2019-02-11 VITALS — Ht 70.0 in | Wt >= 6400 oz

## 2019-02-11 DIAGNOSIS — K219 Gastro-esophageal reflux disease without esophagitis: Secondary | ICD-10-CM

## 2019-02-11 DIAGNOSIS — K580 Irritable bowel syndrome with diarrhea: Secondary | ICD-10-CM | POA: Diagnosis not present

## 2019-02-11 DIAGNOSIS — R195 Other fecal abnormalities: Secondary | ICD-10-CM | POA: Diagnosis not present

## 2019-02-11 NOTE — Progress Notes (Signed)
Chief Complaint:   Referring Provider:  Pearline Cablesopland, Jessica C, MD      ASSESSMENT AND PLAN;   #1. GERD  #2. Positive cologuard test  #3. IBS with diarrhea (better with gluten free diet)  Plan: - Omeprazole 40mg  po bid to continue. - [facility] with 2 day prep. (BMI>50).  I have explained the risks and benefits including small but definite risks of perforation, bleeding, aspiration.  Benefits were also discussed.  He wishes to proceed. - Continue bentyl for now. - Continue gluten free diet. - Encouraged weight loss.    HPI:    Timothy Solis is a 53 y.o. male  With positive Cologuard screening test.  Had normal CBC including normal hemoglobin. Would occasionally notice some rectal bleeding attributed to hemorrhoids. Has been diagnosed with IBS with predominant diarrhea.  Would have diarrhea intermittently at the frequency of 2-3 times/day only 2-3 times per week.  Certainly much better since he has been on gluten-free diet.  He had multiple allergies.  All allergies are better since he has been on gluten-free diet.  No family history of celiac disease.  Has longstanding history of heartburn.  No odynophagia or dysphagia.  Has also been recommended to get EGD performed.  Denies having any significant abdominal pain.  No bloating.  No melena.  No recent weight loss.   Additional PMH: OSA on CPAP Gout Had colon over 15 yrs ago. Past Medical History:  Diagnosis Date  . Allergy   . Chronic fatigue syndrome   . Depression   . GERD (gastroesophageal reflux disease)   . HTN (hypertension)   . IBS (irritable bowel syndrome)   . IBS (irritable bowel syndrome)   . Nephrolithiasis   . Obesity     Past Surgical History:  Procedure Laterality Date  . BACK SURGERY    . CHOLECYSTECTOMY    . KNEE SURGERY      Family History  Problem Relation Age of Onset  . Coronary artery disease Other   . Cancer Other   . Heart disease Mother   . Multiple sclerosis  Father   . Cancer Brother   . Multiple sclerosis Brother   . Heart disease Maternal Grandmother   . Cancer Maternal Grandfather   . Multiple sclerosis Brother   . Multiple sclerosis Maternal Uncle   . Colon cancer Neg Hx     Social History   Tobacco Use  . Smoking status: Current Some Day Smoker    Types: Cigars  . Smokeless tobacco: Never Used  Substance Use Topics  . Alcohol use: Yes    Comment: occasional- rare  . Drug use: Never    Current Outpatient Medications  Medication Sig Dispense Refill  . Armodafinil 250 MG tablet Take 1 tablet (250 mg total) by mouth daily. 30 tablet 5  . citalopram (CELEXA) 40 MG tablet Take 1 tablet (40 mg total) by mouth daily. 90 tablet 1  . cyclobenzaprine (FLEXERIL) 10 MG tablet TAKE 1 TABLET BY MOUTH BY MOUTH AS NEEDED FOR MUSCLE SPASM 30 tablet 2  . diazepam (VALIUM) 5 MG tablet TAKE 1 TABLET BY MOUTH EVERY 8 HOURS AS NEEDED FOR ANXIETY 15 tablet 2  . dicyclomine (BENTYL) 20 MG tablet TAKE 1 TABLET BY MOUTH TWICE DAILY 180 tablet 3  . HYDROcodone-acetaminophen (NORCO/VICODIN) 5-325 MG tablet Take 1 tablet by mouth every 6 (six) hours as needed for moderate pain. 20 tablet 0  . lisinopril (ZESTRIL) 20 MG tablet Take  1 tablet (20 mg total) by mouth daily. 90 tablet 3  . rosuvastatin (CRESTOR) 20 MG tablet Take 1 tablet (20 mg total) by mouth daily. 90 tablet 3  . Vitamin D, Ergocalciferol, (DRISDOL) 1.25 MG (50000 UT) CAPS capsule Take 1 capsule (50,000 Units total) by mouth every 7 (seven) days. 13 capsule 3   No current facility-administered medications for this visit.     Allergies  Allergen Reactions  . Ceclor [Cefaclor]     Can't remember what reaction had  . Oxycodone Itching  . Prednisone     Review of Systems:  Constitutional: Denies fever, chills, diaphoresis, appetite change and fatigue.  HEENT: had multiple allergies - better .   Respiratory: Denies SOB, DOE, cough, chest tightness,  and wheezing.   Cardiovascular: Denies  chest pain, palpitations and leg swelling.  Genitourinary: Denies dysuria, urgency, frequency, hematuria, flank pain and difficulty urinating.  Musculoskeletal: Denies myalgias, back pain, joint swelling, arthralgias and gait problem.  Skin: No rash.  Neurological: Denies dizziness, seizures, syncope, weakness, light-headedness, numbness and headaches.  Hematological: Denies adenopathy. Easy bruising, personal or family bleeding history  Psychiatric/Behavioral: Has anxiety or depression     Physical Exam:    Ht 5\' 10"  (1.778 m)   Wt (!) 404 lb (183.3 kg)   BMI 57.97 kg/m  Filed Weights   02/11/19 1335  Weight: (!) 404 lb (183.3 kg)  tele   Data Reviewed: I have personally reviewed following labs and imaging studies  CBC: CBC Latest Ref Rng & Units 12/14/2018 07/19/2018 08/31/2017  WBC 3.4 - 10.8 x10E3/uL 8.6 10.6(H) 7.8  Hemoglobin 13.0 - 17.7 g/dL 14.2 15.1 14.9  Hematocrit 37.5 - 51.0 % 43.0 45.5 43.9  Platelets 150 - 450 x10E3/uL 257 297.0 276.0    CMP: CMP Latest Ref Rng & Units 07/19/2018 08/31/2017 11/10/2016  Glucose 70 - 99 mg/dL 131(H) 137(H) 93  BUN 6 - 23 mg/dL 16 16 18   Creatinine 0.40 - 1.50 mg/dL 1.20 1.02 1.04  Sodium 135 - 145 mEq/L 143 140 139  Potassium 3.5 - 5.1 mEq/L 4.6 4.2 4.0  Chloride 96 - 112 mEq/L 103 103 103  CO2 19 - 32 mEq/L 30 29 25   Calcium 8.4 - 10.5 mg/dL 9.4 9.6 9.8  Total Protein 6.0 - 8.3 g/dL - 7.5 7.7  Total Bilirubin 0.2 - 1.2 mg/dL - 0.3 0.4  Alkaline Phos 39 - 117 U/L - 94 97  AST 0 - 37 U/L - 21 23  ALT 0 - 53 U/L - 22 22   This service was provided via telemedicine. Video failed as it didn't connect.  The patient was located at home.  The provider was located in office.  The patient did consent to this telephone visit and is aware of possible charges through their insurance for this visit.  The patient was referred by Dr. Lorelei Pont.    Time spent on call/coordination of care: 30 min    Carmell Austria, MD 02/11/2019, 1:40 PM  Cc:  Copland, Gay Filler, MD

## 2019-02-17 ENCOUNTER — Other Ambulatory Visit: Payer: Self-pay | Admitting: Family Medicine

## 2019-02-17 ENCOUNTER — Other Ambulatory Visit: Payer: Self-pay | Admitting: *Deleted

## 2019-02-17 DIAGNOSIS — I10 Essential (primary) hypertension: Secondary | ICD-10-CM

## 2019-02-17 DIAGNOSIS — F325 Major depressive disorder, single episode, in full remission: Secondary | ICD-10-CM

## 2019-02-17 DIAGNOSIS — K589 Irritable bowel syndrome without diarrhea: Secondary | ICD-10-CM

## 2019-02-17 MED ORDER — LISINOPRIL 20 MG PO TABS: 20.0000 mg | ORAL_TABLET | Freq: Every day | ORAL | 3 refills | Status: DC

## 2019-02-17 MED ORDER — DICYCLOMINE HCL 20 MG PO TABS: 20.0000 mg | ORAL_TABLET | Freq: Two times a day (BID) | ORAL | 3 refills | Status: DC

## 2019-02-17 MED ORDER — CITALOPRAM HYDROBROMIDE 40 MG PO TABS: 40.0000 mg | ORAL_TABLET | Freq: Every day | ORAL | 1 refills | Status: DC

## 2019-03-20 IMAGING — DX DG CHEST 2V
2 series · 2 of 2 positions shown · non-contrast
Comparison: 03/10/2012

CLINICAL DATA: Left-sided chest pain for months

EXAM:
CHEST  2 VIEW

[chest pa]
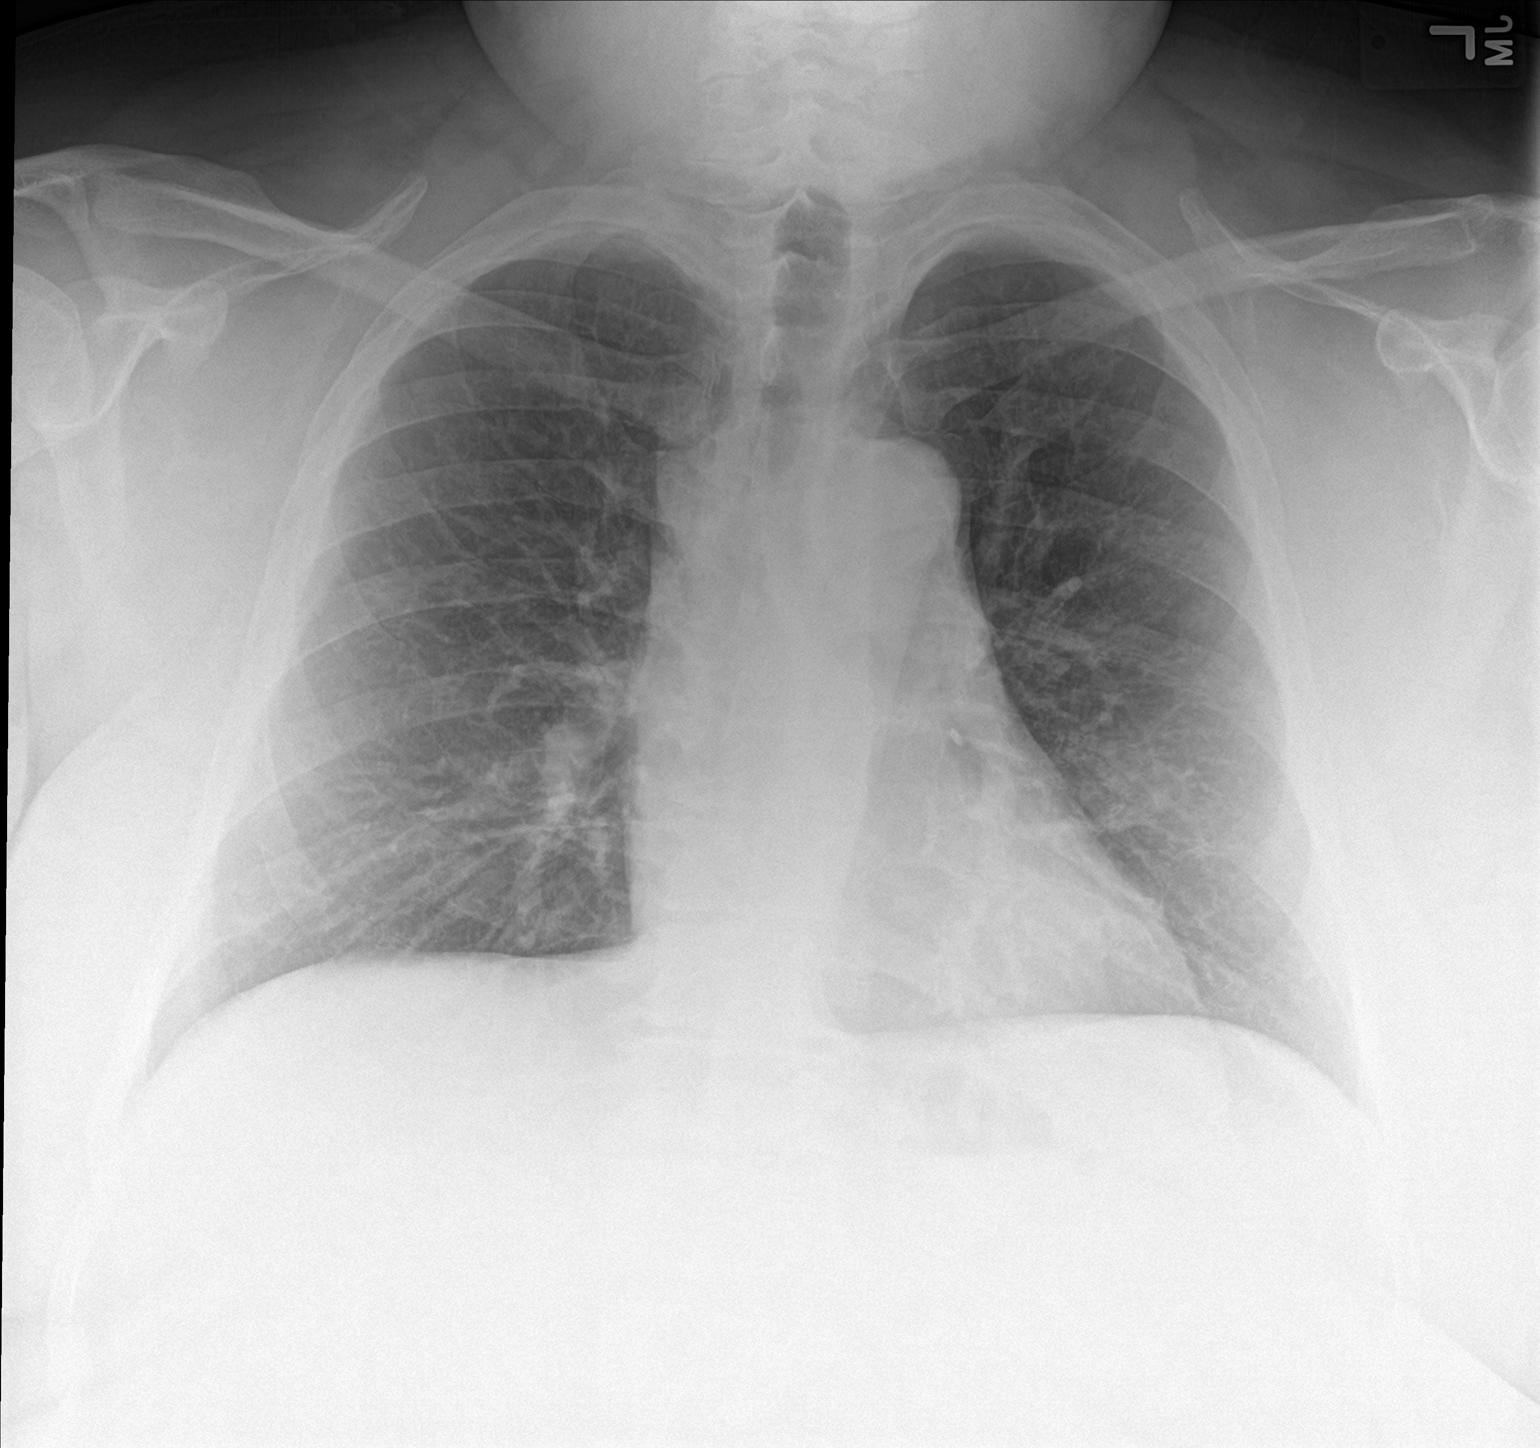

[chest lat]
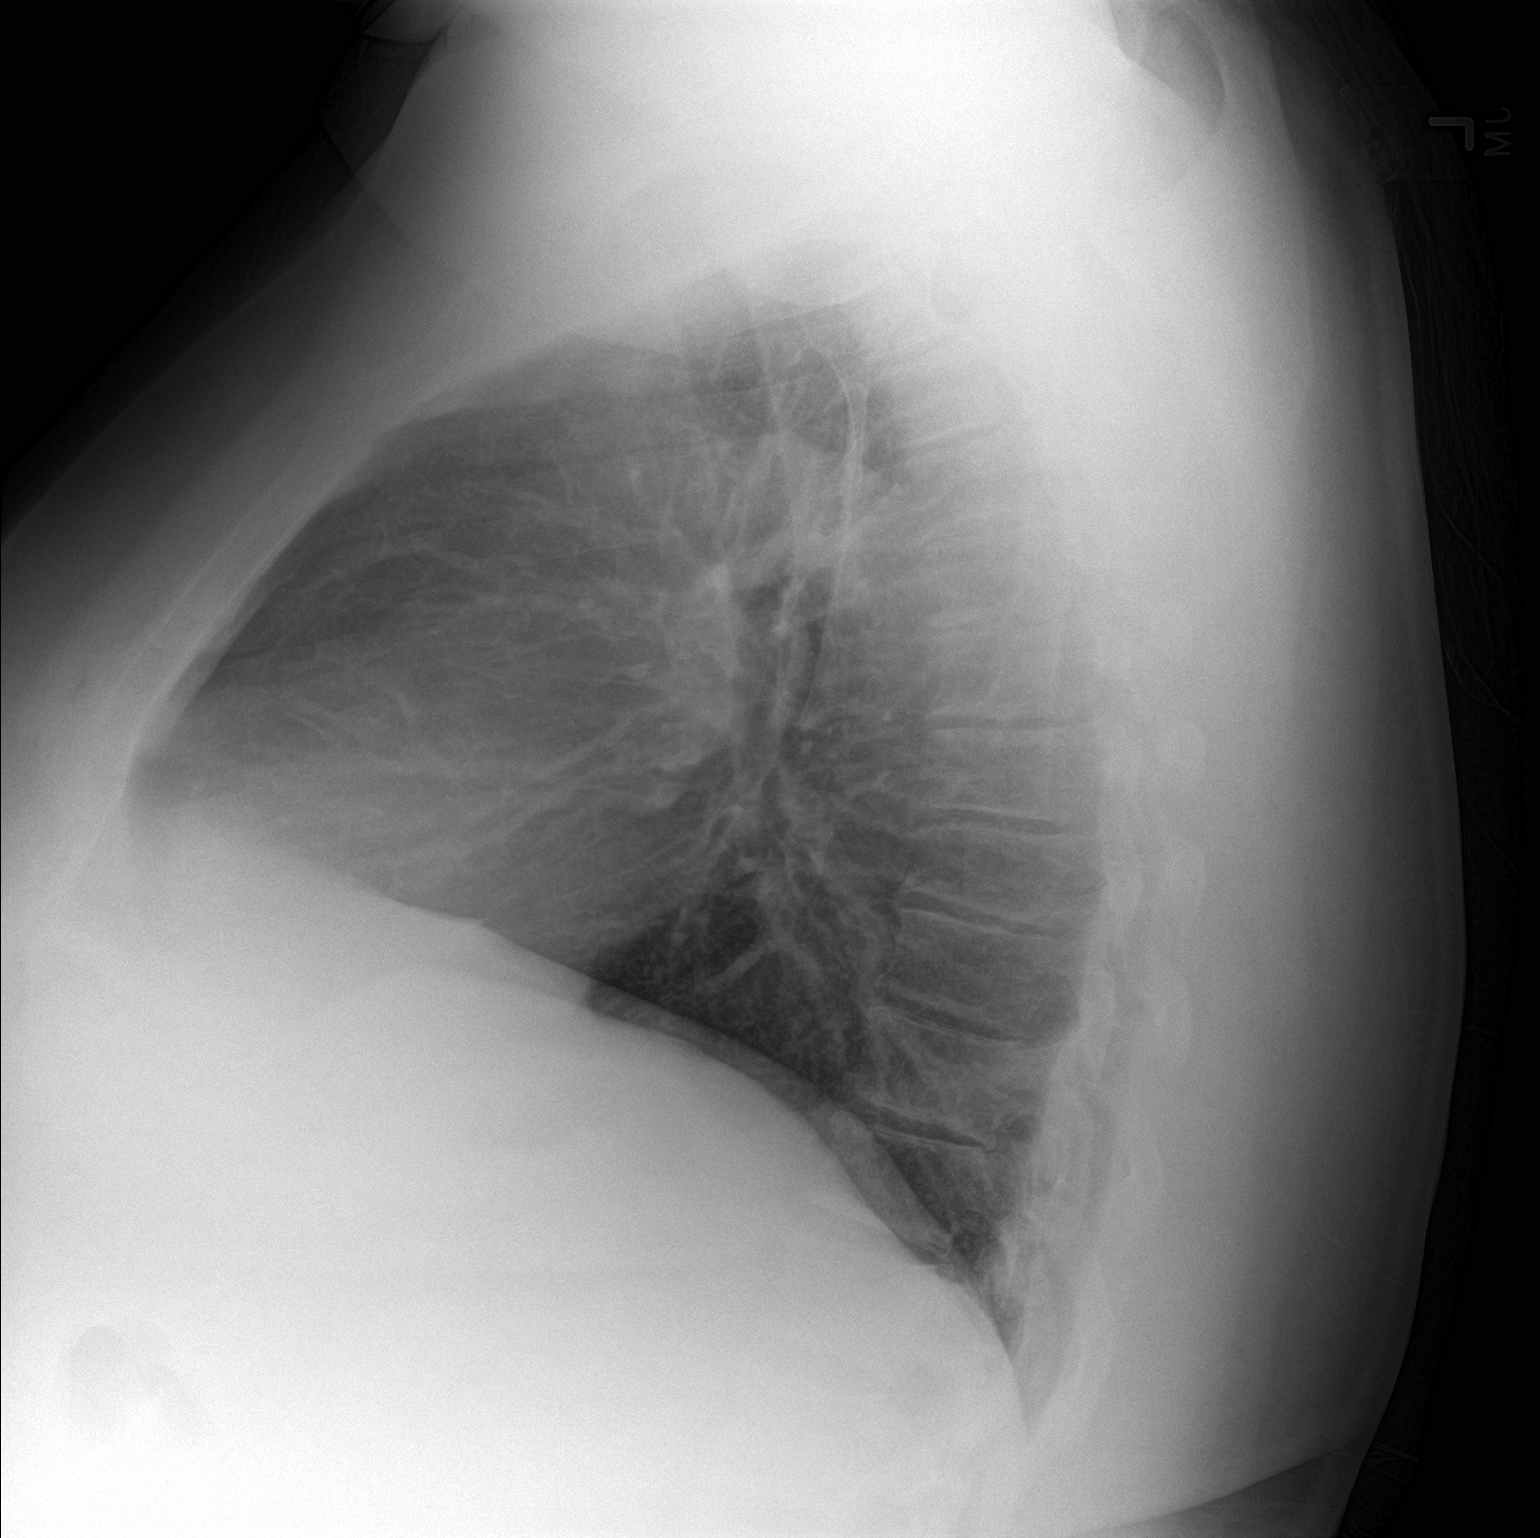

[2 of 2 positions shown; findings below may reference images not displayed]

FINDINGS: The heart size and mediastinal contours are within normal limits.
Both lungs are clear. The visualized skeletal structures are
unremarkable.
IMPRESSION: No active cardiopulmonary disease.

## 2019-04-22 ENCOUNTER — Telehealth: Payer: Self-pay | Admitting: Family Medicine

## 2019-04-22 NOTE — Telephone Encounter (Signed)
Error

## 2019-04-22 NOTE — Telephone Encounter (Signed)
[facility] called in to make provider aware that pt is taking citalopram (CELEXA) 40 MG tablet as well as Omeprazole 40MG  which is prescribed by a different Dr.   Pharmacy is concerned that these 2 medications may cause a reaction to pt. They would like to know if provider would like for pt to continue both?   They would like to know if provider would like to make a change?    Phone: [phone] ext:  [phone]

## 2019-04-23 ENCOUNTER — Encounter: Payer: Self-pay | Admitting: Family Medicine

## 2019-04-23 NOTE — Telephone Encounter (Signed)
Message to pt.

## 2019-04-25 ENCOUNTER — Telehealth: Payer: Self-pay | Admitting: Family Medicine

## 2019-04-25 MED ORDER — FAMOTIDINE 20 MG PO TABS: 20.0000 mg | ORAL_TABLET | Freq: Two times a day (BID) | ORAL | 3 refills | Status: DC

## 2019-04-25 NOTE — Telephone Encounter (Signed)
Called pt- I sent him a mychart message already with details but not read yet.    He was started on the omeprazole - he was supposed to take it for 3 months for what sounds like esophagitis and then stop he thinks - however he has continue to take it longer He is afraid that reflux will come back if he is not taking any meds, so I will rx pepcid for him to try instead.   He will let me know how this works out for him

## 2019-04-25 NOTE — Telephone Encounter (Signed)
Please advise 

## 2019-04-25 NOTE — Telephone Encounter (Signed)
Pharmacy called and stated that they wanted to make Dr. Lorelei Pont aware of another provider prescribing the Pt omeprazole and their can be a reaction and increased risk taking that with the citalopram (CELEXA) 40 MG tablet Prescribed by Dr. Lorelei Pont. They reached out to the other provider to see if they would change the Omeprazole Rx or DC it / Pharmacy also stated their could be an increase reaction /risk to taking Citalopram with cyclobenzaprine (FLEXERIL) 10 MG tablet  And the Citalopram dose may need to be decreased / please call pharmacy to discuss and advise

## 2019-04-26 NOTE — Telephone Encounter (Signed)
Mark from [employer]  States that pepcid is on back order and wants to know if Dr. Lorelei Pont would be willing to change to Pantoprazole.  Elta Guadeloupe wants to know if Dr. Lorelei Pont is also aware of increased risk for serotonin syndrome with the citalopram and cyclobenzaprine and needs to know what is being done to monitor pt for this.  Elta Guadeloupe can be reached at [phone].

## 2019-04-28 ENCOUNTER — Encounter: Payer: Self-pay | Admitting: Family Medicine

## 2019-04-28 MED ORDER — PANTOPRAZOLE SODIUM 20 MG PO TBEC: 20.0000 mg | DELAYED_RELEASE_TABLET | Freq: Every day | ORAL | 3 refills | Status: DC

## 2019-04-28 NOTE — Addendum Note (Signed)
Addended by: Lamar Blinks C on: 04/28/2019 12:47 PM   Modules accepted: Orders

## 2019-04-28 NOTE — Telephone Encounter (Signed)
Pharmacy called stating the other prescriber was able to prescribe pantoprazole. Pharmacy also states the citalopram and cyclobenzaprine there is an increased risk of serotonin syndrome. Please contact them if you are aware of risk and if PCP is monitoring side affects. Pharmacy also states the famotidine is on back order and is requesting to know if something else will be sent in.  Please advise.   [phone]

## 2019-05-02 NOTE — Telephone Encounter (Signed)
Called pt and LMOM- please check mychart account for more info from me

## 2019-05-17 NOTE — Progress Notes (Addendum)
[facility] [address], [place], [place] [address] [phone]  Date:  05/19/2019   Name:  Timothy Solis   DOB:  01/26/1966   MRN:  [id number]  PCP:  Timothy Mclean, MD    Chief Complaint: Diabetes (follow up, foot exam) and Flu Vaccine   History of Present Illness:  Timothy Solis is a 53 y.o. very pleasant male patient who presents with the following:  Periodic follow-up visit today-  History of DM, OSA, dyslipidemia, severe obesity, depression, IBS, chronic fatigue  Last seen by myself in May of this year for a virtual visit At that time he was concerned about gait disturbance and we had him see neurology for concern of MS He was found to have a low vitamin D level - he feels like this helped him He is taking a high dose weekly vit D now  A pharmacist also suggested serotonin syndrome to him-  He stopped using flexeril and he feels much better They had planned for an MRI but this did not happen yet that I can see -Timothy Solis reports that he ran a low-grade fever the day before the MRI was scheduled so it had been canceled  Needs a1c and complete labs today  Family history of prostate cancer, grandfather Foot exam due today Eye exam- this may be due, he will catch up  Colon cancer screening- he had a positive cologuard but has not yet followed up for his colonoscopy -also delayed due to pandemic Flu shot- give today  Pneumonia vaccine- give today   He may get some tingling or pain in his toes when he lies in bed at night  When he is up and about he does fine  He is not bothered enough by the symptoms to want to try medication  He is not having to use narcotics for pain now- he is taking a CBD once a day and sometimes some tylenol However he has been able to stop hydrocodone and also Flexeril.  He is very pleased He has an occasional panic attack and uses Valium about twice a month  Lab Results   Component Value Date   HGBA1C 6.5 07/19/2018   Wt Readings from Last 3 Encounters:  05/19/19 (!) 385 lb (174.6 kg)  02/11/19 (!) 404 lb (183.3 kg)  12/14/18 (!) 406 lb (184.2 kg)   He has lost some weight he thinks due to being gluten free/ eating much lower carbs, he also has not not consumed alcohol for the last several months  He has not felt the urge to drink alcohol, as his pain has been so much better  Patient Active Problem List   Diagnosis Date Noted  . Diabetes, polyneuropathy ([place]) 12/14/2018  . OSA on CPAP 12/14/2018  . Paresthesia 12/14/2018  . Diabetes mellitus type 2 in obese ([place]) 07/20/2018  . Costochondritis 09/14/2017  . Dyslipidemia 08/30/2017  . IBS (irritable bowel syndrome) 11/05/2015  . Morbid obesity ([place]) 11/05/2015  . Major depressive disorder with single episode, in remission ([place]) 11/05/2015  . Chronic fatigue 11/05/2015  . Allergic rhinitis, cause unspecified 04/01/2013  . Unspecified asthma(493.90) 04/01/2013    Past Medical History:  Diagnosis Date  . Allergy   . Chronic fatigue syndrome   . Depression   . GERD (gastroesophageal reflux disease)   . HTN (hypertension)   . IBS (irritable bowel syndrome)   . IBS (irritable bowel syndrome)   .  Nephrolithiasis   . Obesity     Past Surgical History:  Procedure Laterality Date  . BACK SURGERY    . CHOLECYSTECTOMY    . KNEE SURGERY      Social History   Tobacco Use  . Smoking status: Current Some Day Smoker    Types: Cigars  . Smokeless tobacco: Never Used  Substance Use Topics  . Alcohol use: Yes    Comment: occasional- rare  . Drug use: Never    Family History  Problem Relation Age of Onset  . Coronary artery disease Other   . Cancer Other   . Heart disease Mother   . Multiple sclerosis Father   . Cancer Brother   . Multiple sclerosis Brother   . Heart disease Maternal Grandmother   . Cancer Maternal Grandfather   . Multiple sclerosis Brother   . Multiple sclerosis  Maternal Uncle   . Colon cancer Neg Hx     Allergies  Allergen Reactions  . Ceclor [Cefaclor]     Can't remember what reaction had  . Oxycodone Itching  . Prednisone     Medication list has been reviewed and updated.  Current Outpatient Medications on File Prior to Visit  Medication Sig Dispense Refill  . Armodafinil 250 MG tablet Take 1 tablet (250 mg total) by mouth daily. 30 tablet 5  . citalopram (CELEXA) 40 MG tablet Take 1 tablet (40 mg total) by mouth daily. 90 tablet 1  . cyclobenzaprine (FLEXERIL) 10 MG tablet TAKE 1 TABLET BY MOUTH BY MOUTH AS NEEDED FOR MUSCLE SPASM 30 tablet 2  . diazepam (VALIUM) 5 MG tablet TAKE 1 TABLET BY MOUTH EVERY 8 HOURS AS NEEDED FOR ANXIETY 15 tablet 2  . dicyclomine (BENTYL) 20 MG tablet Take 1 tablet (20 mg total) by mouth 2 (two) times daily. 180 tablet 3  . HYDROcodone-acetaminophen (NORCO/VICODIN) 5-325 MG tablet Take 1 tablet by mouth every 6 (six) hours as needed for moderate pain. 20 tablet 0  . lisinopril (ZESTRIL) 20 MG tablet Take 1 tablet (20 mg total) by mouth daily. 90 tablet 3  . pantoprazole (PROTONIX) 20 MG tablet Take 1 tablet (20 mg total) by mouth daily. 90 tablet 3  . rosuvastatin (CRESTOR) 20 MG tablet Take 1 tablet (20 mg total) by mouth daily. 90 tablet 3  . Vitamin D, Ergocalciferol, (DRISDOL) 1.25 MG (50000 UT) CAPS capsule Take 1 capsule (50,000 Units total) by mouth every 7 (seven) days. 13 capsule 3   No current facility-administered medications on file prior to visit.     Review of Systems:  As per HPI- otherwise negative.   Physical Examination: Vitals:   05/19/19 1314  BP: 140/82  Pulse: 88  Resp: (!) 22  Temp: (!) 96.1 F (35.6 C)  SpO2: 97%   Vitals:   05/19/19 1314  Weight: (!) 385 lb (174.6 kg)  Height: 5\' 10"  (1.778 m)   Body mass index is 55.24 kg/m. Ideal Body Weight: Weight in (lb) to have BMI = 25: 173.9  GEN: WDWN, NAD, Non-toxic, A & O x 3, obese, looks well HEENT: Atraumatic,  Normocephalic. Neck supple. No masses, No LAD. Ears and Nose: No external deformity. CV: RRR, No M/G/R. No JVD. No thrill. No extra heart sounds. PULM: CTA B, no wheezes, crackles, rhonchi. No retractions. No resp. distress. No accessory muscle use. EXTR: No c/c/e NEURO Normal gait for patient, uses a cane PSYCH: Normally interactive. Conversant. Not depressed or anxious appearing.  Calm demeanor.  Foot exam:  Normal except he did miss 1-2 monofilament sites on each foot  Assessment and Plan: Positive colorectal cancer screening using Cologuard test  Diabetes mellitus type 2 in obese (HCC) - Plan: Comprehensive metabolic panel, Hemoglobin A1c  Screening for deficiency anemia - Plan: CBC  Vitamin D deficiency - Plan: Vitamin D (25 hydroxy)  Dyslipidemia - Plan: Lipid panel  Immunization due - Plan: Pneumococcal polysaccharide vaccine 23-valent greater than or equal to 2yo subcutaneous/IM  Medication monitoring encounter - Plan: CBC  Chronic fatigue - Plan: Armodafinil 250 MG tablet  Screening for prostate cancer - Plan: PSA, Medicare ( [facility] only)  Here today for a follow-up visit Nicola GirtCorwin is doing very well, he has made some positive changes in his diet and has lost weight.  His pain is reduced.  Overall he is doing great He is now ready to have colonoscopy done, I will send a message to Dr. Chales AbrahamsGupta Await labs as above He is putting off the MRI for now, as MS symptoms seem to have disappeared.  He will let me know if any other concerns Refilled armadafinil for chronic fatigue and somnolence Flu shot, Pneumovax given today  Signed Abbe AmsterdamJessica Tametra Ahart, MD  Received his labs 10/30- message to pt A1c looks good May need vit D ?taking crestor  Results for orders placed or performed in visit on 05/19/19  Vitamin D (25 hydroxy)  Result Value Ref Range   VITD 21.37 (L) 30.00 - 100.00 ng/mL  CBC  Result Value Ref Range   WBC 8.7 4.0 - 10.5 K/uL   RBC 5.17 4.22 - 5.81 Mil/uL    Platelets 310.0 150.0 - 400.0 K/uL   Hemoglobin 14.7 13.0 - 17.0 g/dL   HCT 36.644.3 44.039.0 - 34.752.0 %   MCV 85.7 78.0 - 100.0 fl   MCHC 33.1 30.0 - 36.0 g/dL   RDW 42.513.6 95.611.5 - 38.715.5 %  Comprehensive metabolic panel  Result Value Ref Range   Sodium 141 135 - 145 mEq/L   Potassium 4.2 3.5 - 5.1 mEq/L   Chloride 102 96 - 112 mEq/L   CO2 31 19 - 32 mEq/L   Glucose, Bld 89 70 - 99 mg/dL   BUN 17 6 - 23 mg/dL   Creatinine, Ser 5.641.07 0.40 - 1.50 mg/dL   Total Bilirubin 0.5 0.2 - 1.2 mg/dL   Alkaline Phosphatase 88 39 - 117 U/L   AST 19 0 - 37 U/L   ALT 19 0 - 53 U/L   Total Protein 7.4 6.0 - 8.3 g/dL   Albumin 4.5 3.5 - 5.2 g/dL   Calcium 9.6 8.4 - 33.210.5 mg/dL   GFR 95.1872.78 >84.16>60.00 mL/min  Hemoglobin A1c  Result Value Ref Range   Hgb A1c MFr Bld 6.0 4.6 - 6.5 %  Lipid panel  Result Value Ref Range   Cholesterol 222 (H) 0 - 200 mg/dL   Triglycerides 606.3278.0 (H) 0.0 - 149.0 mg/dL   HDL 01.6038.20 (L) >10.93>39.00 mg/dL   VLDL 23.555.6 (H) 0.0 - 57.340.0 mg/dL   Total CHOL/HDL Ratio 6    NonHDL 184.21   PSA, Medicare (  [facility] only)  Result Value Ref Range   PSA 0.56 0.10 - 4.00 ng/ml  LDL cholesterol, direct  Result Value Ref Range   Direct LDL 136.0 mg/dL

## 2019-05-18 ENCOUNTER — Other Ambulatory Visit: Payer: Self-pay

## 2019-05-19 ENCOUNTER — Ambulatory Visit (INDEPENDENT_AMBULATORY_CARE_PROVIDER_SITE_OTHER): Payer: Medicare Other | Admitting: Family Medicine

## 2019-05-19 ENCOUNTER — Encounter: Payer: Self-pay | Admitting: Family Medicine

## 2019-05-19 ENCOUNTER — Other Ambulatory Visit: Payer: Self-pay

## 2019-05-19 VITALS — BP 140/82 | HR 88 | Temp 96.1°F | Resp 22 | Ht 70.0 in | Wt 385.0 lb

## 2019-05-19 DIAGNOSIS — Z125 Encounter for screening for malignant neoplasm of prostate: Secondary | ICD-10-CM | POA: Diagnosis not present

## 2019-05-19 DIAGNOSIS — Z23 Encounter for immunization: Secondary | ICD-10-CM

## 2019-05-19 DIAGNOSIS — E785 Hyperlipidemia, unspecified: Secondary | ICD-10-CM

## 2019-05-19 DIAGNOSIS — Z130 Encounter for screening for diseases of the blood and blood-forming organs and certain disorders involving the immune mechanism: Secondary | ICD-10-CM

## 2019-05-19 DIAGNOSIS — R195 Other fecal abnormalities: Secondary | ICD-10-CM | POA: Diagnosis not present

## 2019-05-19 DIAGNOSIS — E1169 Type 2 diabetes mellitus with other specified complication: Secondary | ICD-10-CM | POA: Diagnosis not present

## 2019-05-19 DIAGNOSIS — R5382 Chronic fatigue, unspecified: Secondary | ICD-10-CM | POA: Diagnosis not present

## 2019-05-19 DIAGNOSIS — Z5181 Encounter for therapeutic drug level monitoring: Secondary | ICD-10-CM | POA: Diagnosis not present

## 2019-05-19 DIAGNOSIS — E669 Obesity, unspecified: Secondary | ICD-10-CM | POA: Diagnosis not present

## 2019-05-19 DIAGNOSIS — E559 Vitamin D deficiency, unspecified: Secondary | ICD-10-CM | POA: Diagnosis not present

## 2019-05-19 LAB — COMPREHENSIVE METABOLIC PANEL
ALT: 19 U/L (ref 0–53)
AST: 19 U/L (ref 0–37)
Albumin: 4.5 g/dL (ref 3.5–5.2)
Alkaline Phosphatase: 88 U/L (ref 39–117)
BUN: 17 mg/dL (ref 6–23)
CO2: 31 mEq/L (ref 19–32)
Calcium: 9.6 mg/dL (ref 8.4–10.5)
Chloride: 102 mEq/L (ref 96–112)
Creatinine, Ser: 1.07 mg/dL (ref 0.40–1.50)
GFR: 72.78 mL/min (ref >60.00)
Glucose, Bld: 89 mg/dL (ref 70–99)
Potassium: 4.2 mEq/L (ref 3.5–5.1)
Sodium: 141 mEq/L (ref 135–145)
Total Bilirubin: 0.5 mg/dL (ref 0.2–1.2)
Total Protein: 7.4 g/dL (ref 6.0–8.3)

## 2019-05-19 LAB — LIPID PANEL
Cholesterol: 222 mg/dL — ABNORMAL HIGH (ref 0–200)
HDL: 38.2 mg/dL — ABNORMAL LOW (ref >39.00)
NonHDL: 184.21
Total CHOL/HDL Ratio: 6
Triglycerides: 278 mg/dL — ABNORMAL HIGH (ref 0.0–149.0)
VLDL: 55.6 mg/dL — ABNORMAL HIGH (ref 0.0–40.0)

## 2019-05-19 LAB — CBC
HCT: 44.3 % (ref 39.0–52.0)
Hemoglobin: 14.7 g/dL (ref 13.0–17.0)
MCHC: 33.1 g/dL (ref 30.0–36.0)
MCV: 85.7 fl (ref 78.0–100.0)
Platelets: 310 10*3/uL (ref 150.0–400.0)
RBC: 5.17 Mil/uL (ref 4.22–5.81)
RDW: 13.6 % (ref 11.5–15.5)
WBC: 8.7 10*3/uL (ref 4.0–10.5)

## 2019-05-19 LAB — HEMOGLOBIN A1C: Hgb A1c MFr Bld: 6 % (ref 4.6–6.5)

## 2019-05-19 LAB — LDL CHOLESTEROL, DIRECT: Direct LDL: 136 mg/dL

## 2019-05-19 MED ORDER — ARMODAFINIL 250 MG PO TABS: 250.0000 mg | ORAL_TABLET | Freq: Every day | ORAL | 5 refills | Status: DC

## 2019-05-19 NOTE — Patient Instructions (Signed)
Always a pleasure to see you -I am glad that you are doing so well!  I will be in touch with your labs Flu shot and pneumonia vaccine today Please get your annual eye exam when you are able [place] job with weight loss and being able to stop some of your medications  I will let Dr Lyndel Safe know that you are ready to schedule your colonoscopy so they will give you a call

## 2019-05-20 ENCOUNTER — Encounter: Payer: Self-pay | Admitting: Family Medicine

## 2019-05-20 ENCOUNTER — Telehealth: Payer: Self-pay

## 2019-05-20 DIAGNOSIS — R195 Other fecal abnormalities: Secondary | ICD-10-CM

## 2019-05-20 DIAGNOSIS — K580 Irritable bowel syndrome with diarrhea: Secondary | ICD-10-CM

## 2019-05-20 LAB — PSA, MEDICARE: PSA: 0.56 ng/ml (ref 0.10–4.00)

## 2019-05-20 LAB — VITAMIN D 25 HYDROXY (VIT D DEFICIENCY, FRACTURES): VITD: 21.37 ng/mL — ABNORMAL LOW (ref 30.00–100.00)

## 2019-05-20 NOTE — Telephone Encounter (Signed)
-----   Message from Jackquline Denmark, MD sent at 05/19/2019  5:20 PM EDT ----- Regarding: colon Hi Jess Will take care of that. Thanks a lot for sending patients.  Your patients love you and are very appreciative. Raj  Bre, Pl set him up for colon for time convenient to him. See may last note Neldon Labella  ----- Message ----- From: Darreld Mclean, MD Sent: 05/19/2019   1:59 PM EDT To: Jackquline Denmark, MD  Hi Veneda Melter saw Jafar today.  You spoke with him about positive Cologuard, he delayed scheduling colonoscopy due to pandemic but would now like to go ahead and schedule.  Could you ask your staff to reach out to him?  Thank you so much JC

## 2019-05-20 NOTE — Telephone Encounter (Signed)
Per 02/11/2019 OV notation=EGD with SB bx/colon at [facility] with 2 day prep. (BMI>50).    Left message for patient to call back to the office to schedule procedure at [facility];

## 2019-05-20 NOTE — Telephone Encounter (Signed)
-----   Message from Jackquline Denmark, MD sent at 05/19/2019  5:24 PM EDT -----  Gre[place] Right and colon at [facility] (2 day prep). Pl carrify  RG

## 2019-05-25 NOTE — Telephone Encounter (Signed)
Dr. Johnell Comings you want me to add this patient to your hospital week in Dec or move him to your Jan 2021 [facility] hospital day? Please advise

## 2019-05-26 ENCOUNTER — Other Ambulatory Visit: Payer: Self-pay | Admitting: *Deleted

## 2019-05-26 DIAGNOSIS — E785 Hyperlipidemia, unspecified: Secondary | ICD-10-CM

## 2019-05-26 MED ORDER — ROSUVASTATIN CALCIUM 20 MG PO TABS: 20.0000 mg | ORAL_TABLET | Freq: Every day | ORAL | 3 refills | Status: DC

## 2019-05-30 NOTE — Telephone Encounter (Signed)
Please move him to January 2021 unless there is a cancellation earlier He needs to let us know if he starts having any problems until then. Thx  RG

## 2019-05-31 NOTE — Telephone Encounter (Signed)
Left message for patient to call back to the office;  

## 2019-06-01 NOTE — Telephone Encounter (Signed)
Please add this to your WL list as I already have several people on my own.

## 2019-06-01 NOTE — Telephone Encounter (Signed)
Please add this patient to the [facility] endo back log as the Jan date is unavailable at this time; thank you

## 2019-06-07 NOTE — Telephone Encounter (Signed)
Patient returned call to the office-patient was informed there are no availabilities for his procedure to be scheduled at [facility] in November/December-patient was informed he would be placed on a wait list for his procedure to be scheduled at [facility] is agreeable with plan of care;  Patient advised to call back to the office at [phone] should questions/concerns arise; Patient verbalized understanding of information/instructions;

## 2019-06-09 ENCOUNTER — Telehealth: Payer: Self-pay | Admitting: Family Medicine

## 2019-06-09 NOTE — Telephone Encounter (Signed)
Pt will give office a call back in Jan 2021 to schedule AWV. SF

## 2019-06-22 ENCOUNTER — Ambulatory Visit: Payer: Self-pay | Admitting: *Deleted

## 2019-06-22 DIAGNOSIS — W1839XA Other fall on same level, initial encounter: Secondary | ICD-10-CM | POA: Diagnosis not present

## 2019-06-22 DIAGNOSIS — M5442 Lumbago with sciatica, left side: Secondary | ICD-10-CM | POA: Diagnosis not present

## 2019-06-22 DIAGNOSIS — M545 Low back pain: Secondary | ICD-10-CM | POA: Diagnosis not present

## 2019-06-22 DIAGNOSIS — Y999 Unspecified external cause status: Secondary | ICD-10-CM | POA: Diagnosis not present

## 2019-06-22 DIAGNOSIS — M546 Pain in thoracic spine: Secondary | ICD-10-CM | POA: Diagnosis not present

## 2019-06-22 NOTE — Telephone Encounter (Signed)
FYI
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
## 2019-06-23 NOTE — Telephone Encounter (Signed)
Patient advised to go to ed. Fyi.

## 2019-07-08 ENCOUNTER — Telehealth: Payer: Self-pay | Admitting: Gastroenterology

## 2019-07-08 NOTE — Telephone Encounter (Signed)
Left message for patient to call back to the office -patient has been scheduled for [facility] procedure on 08/16/2019-will need to be scheduled for COVID screen and instructions sent to the patient;

## 2019-07-11 ENCOUNTER — Other Ambulatory Visit: Payer: Self-pay

## 2019-07-11 DIAGNOSIS — K580 Irritable bowel syndrome with diarrhea: Secondary | ICD-10-CM

## 2019-07-11 DIAGNOSIS — R195 Other fecal abnormalities: Secondary | ICD-10-CM

## 2019-07-11 NOTE — Addendum Note (Signed)
Addended by: Mohammed Kindle on: 07/11/2019 09:36 AM   Modules accepted: Orders

## 2019-07-11 NOTE — Telephone Encounter (Signed)
Called and spoke with patient-patient advised of being scheduled for procedures at [facility] and is agreeable to plan of care; instructions have been mailed to the patient concerning his COVID screen on 08/12/2019 at 1:10 pm and the EGD with small bowel biopsy and colonoscopy scheduled on 08/16/2019 at 10:45 am; Patient advised to call back to the office at [phone] should questions/concerns arise;  Patient verbalized understanding of information/instructions;

## 2019-07-11 NOTE — Telephone Encounter (Signed)
Please see additional documentation concerning this patient 

## 2019-08-12 ENCOUNTER — Other Ambulatory Visit (HOSPITAL_COMMUNITY): Payer: Medicare Other

## 2019-08-16 ENCOUNTER — Ambulatory Visit (HOSPITAL_COMMUNITY): Admit: 2019-08-16 | Payer: Medicare Other | Admitting: Gastroenterology

## 2019-08-16 ENCOUNTER — Encounter (HOSPITAL_COMMUNITY): Payer: Self-pay

## 2019-08-16 SURGERY — ESOPHAGOGASTRODUODENOSCOPY (EGD) WITH PROPOFOL
Anesthesia: Monitor Anesthesia Care

## 2019-08-19 ENCOUNTER — Encounter: Payer: Self-pay | Admitting: Family Medicine

## 2019-09-07 ENCOUNTER — Other Ambulatory Visit: Payer: Self-pay

## 2019-09-07 ENCOUNTER — Ambulatory Visit: Payer: Medicare Other | Admitting: Medical

## 2019-09-07 ENCOUNTER — Telehealth: Payer: Self-pay | Admitting: *Deleted

## 2019-09-07 DIAGNOSIS — M25461 Effusion, right knee: Secondary | ICD-10-CM | POA: Diagnosis not present

## 2019-09-07 DIAGNOSIS — M25561 Pain in right knee: Secondary | ICD-10-CM | POA: Diagnosis not present

## 2019-09-07 DIAGNOSIS — M791 Myalgia, unspecified site: Secondary | ICD-10-CM | POA: Diagnosis not present
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
## 2019-09-08 ENCOUNTER — Encounter: Payer: Self-pay | Admitting: Family Medicine

## 2019-09-08 DIAGNOSIS — M25561 Pain in right knee: Secondary | ICD-10-CM

## 2019-09-09 MED ORDER — KETOROLAC TROMETHAMINE 30 MG/ML IJ SOLN
30.00 | INTRAMUSCULAR | Status: DC
Start: 2019-09-07 — End: 2019-09-09

## 2019-09-12 NOTE — Addendum Note (Signed)
Addended by: Pearline Cables on: 09/12/2019 06:26 AM   Modules accepted: Orders

## 2019-09-19 DIAGNOSIS — M2391 Unspecified internal derangement of right knee: Secondary | ICD-10-CM | POA: Diagnosis not present

## 2019-09-26 ENCOUNTER — Encounter: Payer: Self-pay | Admitting: Family Medicine

## 2019-09-26 DIAGNOSIS — R5382 Chronic fatigue, unspecified: Secondary | ICD-10-CM

## 2019-09-26 MED ORDER — ARMODAFINIL 250 MG PO TABS: 250.0000 mg | ORAL_TABLET | Freq: Every day | ORAL | 5 refills | Status: DC

## 2019-09-26 NOTE — Addendum Note (Signed)
Addended by: Steve Rattler A on: 09/26/2019 03:48 PM   Modules accepted: Orders

## 2019-09-26 NOTE — Addendum Note (Signed)
Addended by: Abbe Amsterdam C on: 09/26/2019 04:58 PM   Modules accepted: Orders

## 2019-10-14 ENCOUNTER — Encounter: Payer: Self-pay | Admitting: Family Medicine

## 2019-10-15 DIAGNOSIS — M25461 Effusion, right knee: Secondary | ICD-10-CM | POA: Diagnosis not present

## 2019-10-15 DIAGNOSIS — M25561 Pain in right knee: Secondary | ICD-10-CM | POA: Diagnosis not present

## 2019-10-15 DIAGNOSIS — M2351 Chronic instability of knee, right knee: Secondary | ICD-10-CM | POA: Diagnosis not present

## 2019-10-31 DIAGNOSIS — M25561 Pain in right knee: Secondary | ICD-10-CM | POA: Diagnosis not present

## 2019-10-31 DIAGNOSIS — R600 Localized edema: Secondary | ICD-10-CM | POA: Diagnosis not present

## 2019-11-02 ENCOUNTER — Encounter: Payer: Self-pay | Admitting: Family Medicine

## 2019-11-09 ENCOUNTER — Encounter: Payer: Self-pay | Admitting: Family Medicine

## 2019-11-18 ENCOUNTER — Other Ambulatory Visit: Payer: Self-pay | Admitting: Family Medicine

## 2019-11-18 DIAGNOSIS — M25561 Pain in right knee: Secondary | ICD-10-CM | POA: Diagnosis not present

## 2019-11-18 DIAGNOSIS — Z01810 Encounter for preprocedural cardiovascular examination: Secondary | ICD-10-CM | POA: Diagnosis not present

## 2019-11-18 DIAGNOSIS — Z8679 Personal history of other diseases of the circulatory system: Secondary | ICD-10-CM | POA: Diagnosis not present

## 2019-11-18 DIAGNOSIS — F325 Major depressive disorder, single episode, in full remission: Secondary | ICD-10-CM

## 2019-11-18 DIAGNOSIS — Z01812 Encounter for preprocedural laboratory examination: Secondary | ICD-10-CM | POA: Diagnosis not present

## 2019-11-18 DIAGNOSIS — Z20822 Contact with and (suspected) exposure to covid-19: Secondary | ICD-10-CM | POA: Diagnosis not present

## 2019-11-18 DIAGNOSIS — M2391 Unspecified internal derangement of right knee: Secondary | ICD-10-CM | POA: Diagnosis not present

## 2019-11-24 DIAGNOSIS — S83271A Complex tear of lateral meniscus, current injury, right knee, initial encounter: Secondary | ICD-10-CM | POA: Diagnosis not present

## 2019-11-24 DIAGNOSIS — M94261 Chondromalacia, right knee: Secondary | ICD-10-CM | POA: Diagnosis not present

## 2019-11-24 DIAGNOSIS — S83281A Other tear of lateral meniscus, current injury, right knee, initial encounter: Secondary | ICD-10-CM | POA: Diagnosis not present

## 2019-11-24 DIAGNOSIS — M2241 Chondromalacia patellae, right knee: Secondary | ICD-10-CM | POA: Diagnosis not present

## 2019-11-24 DIAGNOSIS — R7301 Impaired fasting glucose: Secondary | ICD-10-CM | POA: Diagnosis not present

## 2019-12-30 ENCOUNTER — Telehealth: Payer: Self-pay

## 2019-12-30 NOTE — Telephone Encounter (Signed)
Left message on patients voicemail regarding an opening at [facility] on 01/09/20 at 9:45 for endo/colon. Requested that patient call me first thing Monday.

## 2020-01-02 ENCOUNTER — Telehealth: Payer: Self-pay

## 2020-01-02 NOTE — Telephone Encounter (Signed)
LMOM to call by noon regarding open [facility] slot for ECL.  Must call by noon or will miss this slot availability.

## 2020-01-31 NOTE — Patient Instructions (Addendum)
It was good to see you again today!  I will be in touch with your labs  I would encourage you to get your covid series asap!  Also please schedule your colonoscopy when you are able  We drained some of the pus from the pimple on your neck today- please try warm compresses to continue to encourage pus to drain.  Let me know if this is getting larger/ worse again I would also encourage you to get your shingles vaccine through your pharmacy at your convenience, but covid is priority You are also due an eye exam Let's plan to visit in 6 months assuming no surprises on your labs

## 2020-01-31 NOTE — Progress Notes (Addendum)
[facility] [address], [address] [place], [place] [address] [phone]  Date:  02/01/2020   Name:  Timothy Solis   DOB:  Jan 15, 1966   MRN:  [id number]  PCP:  Pearline Cables, MD    Chief Complaint: Possible Abcess (neck, one week, redness, decreased some in size, painful, no fever)   History of Present Illness:  Timothy Solis is a 54 y.o. very pleasant male patient who presents with the following:  Pt with history of diet controlled diabetes with neuropathy, OSA, obesity, chronic fatigue, dyslipidemia, limited mobility. Here today with concern of a possible superficial abscess in his neck Last seen by myself in October of 2020  He had a positive cologuard screening last year-he is in contact with DR Chales Abrahams and they do plan to do a colonoscopy but it got delayed due to covid 19; he continues to work on getting this scheduled Eye exam; overdue for this covid series- encouraged him to get this asap shingrix-patient has Medicare CBC and BMP done per-op in April   He has been seeing Dr Katrinka Blazing with [facility] for right knee pain, he had a scope in May- he had a meniscal tear repair. He did get improvement as far as his stability and pain He does have some stiffness and still uses his cane.   Today he notes a possible small abscess on his neck- there for about one week Was larger and red, seems to be getting better. He is not sure if due to a nick from shaving or an ingrown hair.  It did not drain No fever noted   Glucose over the last week 100- 170  He is using his CPAP, wonders if he might need a higher pressure setting.  He notes that he often does feel tired, he needs a nap basically every day.  He does of course also have chronic fatigue syndrome which may be part of his symptoms  Lab Results  Component Value Date   HGBA1C 6.0 05/19/2019     Patient Active Problem List   Diagnosis Date Noted  . Diabetes,  polyneuropathy (HCC) 12/14/2018  . OSA on CPAP 12/14/2018  . Paresthesia 12/14/2018  . Diabetes mellitus type 2 in obese (HCC) 07/20/2018  . Costochondritis 09/14/2017  . Dyslipidemia 08/30/2017  . IBS (irritable bowel syndrome) 11/05/2015  . Morbid obesity (HCC) 11/05/2015  . Major depressive disorder with single episode, in remission (HCC) 11/05/2015  . Chronic fatigue 11/05/2015  . Allergic rhinitis, cause unspecified 04/01/2013  . Unspecified asthma(493.90) 04/01/2013    Past Medical History:  Diagnosis Date  . Allergy   . Chronic fatigue syndrome   . Depression   . GERD (gastroesophageal reflux disease)   . HTN (hypertension)   . IBS (irritable bowel syndrome)   . IBS (irritable bowel syndrome)   . Nephrolithiasis   . Obesity     Past Surgical History:  Procedure Laterality Date  . BACK SURGERY    . CHOLECYSTECTOMY    . KNEE SURGERY      Social History   Tobacco Use  . Smoking status: Current Some Day Smoker    Types: Cigars  . Smokeless tobacco: Never Used  Substance Use Topics  . Alcohol use: Yes    Comment: occasional- rare  . Drug use: Never    Family History  Problem Relation Age of Onset  . Coronary artery disease Other   . Cancer Other   .  Heart disease Mother   . Multiple sclerosis Father   . Cancer Brother   . Multiple sclerosis Brother   . Heart disease Maternal Grandmother   . Cancer Maternal Grandfather   . Multiple sclerosis Brother   . Multiple sclerosis Maternal Uncle   . Colon cancer Neg Hx     Allergies  Allergen Reactions  . Ceclor [Cefaclor]     Can't remember what reaction had  . Cyclobenzaprine Other (See Comments)    Serotonin syndrome per pt  . Other Other (See Comments)    Sneezing, watering eyes  . Oxycodone Itching  . Prednisone     Medication list has been reviewed and updated.  Current Outpatient Medications on File Prior to Visit  Medication Sig Dispense Refill  . citalopram (CELEXA) 40 MG tablet Take 1  tablet (40 mg total) by mouth daily. 90 tablet 0  . dicyclomine (BENTYL) 20 MG tablet Take 1 tablet (20 mg total) by mouth 2 (two) times daily. 180 tablet 3  . lisinopril (ZESTRIL) 20 MG tablet Take 1 tablet (20 mg total) by mouth daily. 90 tablet 3  . pantoprazole (PROTONIX) 20 MG tablet Take 1 tablet (20 mg total) by mouth daily. 90 tablet 3  . rosuvastatin (CRESTOR) 20 MG tablet Take 1 tablet (20 mg total) by mouth daily. 90 tablet 3  . Vitamin D, Ergocalciferol, (DRISDOL) 1.25 MG (50000 UT) CAPS capsule Take 1 capsule (50,000 Units total) by mouth every 7 (seven) days. 13 capsule 3   No current facility-administered medications on file prior to visit.    Review of Systems:  As per HPI- otherwise negative.   Physical Examination: Vitals:   02/01/20 1401  BP: (!) 146/80  Pulse: 82  Resp: 20  Temp: 99.1 F (37.3 C)  SpO2: 97%   Vitals:   02/01/20 1401  Weight: (!) 406 lb (184.2 kg)  Height: 5\' 10"  (1.778 m)   Body mass index is 58.25 kg/m. Ideal Body Weight: Weight in (lb) to have BMI = 25: 173.9  GEN: no acute distress.  Obese, looks well  HEENT: Atraumatic, Normocephalic.  Ears and Nose: No external deformity. CV: RRR, No M/G/R. No JVD. No thrill. No extra heart sounds. PULM: CTA B, no wheezes, crackles, rhonchi. No retractions. No resp. distress. No accessory muscle use. ABD: S, NT, ND, +BS. No rebound. No HSM. EXTR: No c/c/e PSYCH: Normally interactive. Conversant.  Foot exam:  Done today  Neck: There is a small abscess/pimple in the left anterior neck, likely started from an ingrown hair. Verbal consent obtained, I used a 25-gauge needle to puncture the top of this pimple and expressed some pus. The patient tolerated the procedure well, no immediate complications   Assessment and Plan: Diabetes mellitus type 2 in obese (HCC) - Plan: Comprehensive metabolic panel, Hemoglobin A1c  Screening for deficiency anemia - Plan: CBC  Dyslipidemia - Plan: Lipid  panel  Screening for HIV (human immunodeficiency virus) - Plan: HIV Antibody (routine testing w rflx)  Encounter for hepatitis C screening test for low risk patient - Plan: Hepatitis C antibody  Medication monitoring encounter - Plan: CBC  Major depressive disorder with single episode, in remission (HCC) - Plan: diazepam (VALIUM) 5 MG tablet  Folliculitis barbae   Here today for follow-up visit We will check A1c today Other routine labs pending as above Patient rarely uses diazepam, would like a refill of this today.  Sent to his pharmacy Pimple/ingrown hair on his neck.  Created track for pus  drainage as above.  Encouraged him to use a warm compress a few times over the next few days and I think this should resolve.  He will let me know of any other problems or concerns Will plan further follow- up pending labs.  This visit occurred during the SARS-CoV-2 public health emergency.  Safety protocols were in place, including screening questions prior to the visit, additional usage of staff PPE, and extensive cleaning of exam room while observing appropriate contact time as indicated for disinfecting solutions.    Signed Abbe Amsterdam, MD  Addendum 7/15, received his labs as below.  Message to patient a1c is back in pre-diabetes range  Results for orders placed or performed in visit on 02/01/20  CBC  Result Value Ref Range   WBC 8.1 4.0 - 10.5 K/uL   RBC 4.58 4.22 - 5.81 Mil/uL   Platelets 263.0 150 - 400 K/uL   Hemoglobin 13.6 13.0 - 17.0 g/dL   HCT 83.3 39 - 52 %   MCV 87.1 78.0 - 100.0 fl   MCHC 34.0 30.0 - 36.0 g/dL   RDW 82.5 05.3 - 97.6 %  Comprehensive metabolic panel  Result Value Ref Range   Sodium 140 135 - 145 mEq/L   Potassium 4.4 3.5 - 5.1 mEq/L   Chloride 104 96 - 112 mEq/L   CO2 28 19 - 32 mEq/L   Glucose, Bld 113 (H) 70 - 99 mg/dL   BUN 21 6 - 23 mg/dL   Creatinine, Ser 7.34 0.40 - 1.50 mg/dL   Total Bilirubin 0.3 0.2 - 1.2 mg/dL   Alkaline Phosphatase  86 39 - 117 U/L   AST 22 0 - 37 U/L   ALT 24 0 - 53 U/L   Total Protein 6.8 6.0 - 8.3 g/dL   Albumin 4.1 3.5 - 5.2 g/dL   GFR 19.37 >90.24 mL/min   Calcium 9.0 8.4 - 10.5 mg/dL  Hemoglobin O9B  Result Value Ref Range   Hgb A1c MFr Bld 6.3 4.6 - 6.5 %  Lipid panel  Result Value Ref Range   Cholesterol 130 0 - 200 mg/dL   Triglycerides 353.2 (H) 0 - 149 mg/dL   HDL 99.24 >26.83 mg/dL   VLDL 41.9 0.0 - 62.2 mg/dL   LDL Cholesterol 53 0 - 99 mg/dL   Total CHOL/HDL Ratio 3    NonHDL 88.44   Hepatitis C antibody  Result Value Ref Range   Hepatitis C Ab NON-REACTIVE NON-REACTI   SIGNAL TO CUT-OFF 0.01 <1.00  HIV Antibody (routine testing w rflx)  Result Value Ref Range   HIV 1&2 Ab, 4th Generation NON-REACTIVE NON-REACTI

## 2020-02-01 ENCOUNTER — Ambulatory Visit (INDEPENDENT_AMBULATORY_CARE_PROVIDER_SITE_OTHER): Payer: Medicare Other | Admitting: Family Medicine

## 2020-02-01 ENCOUNTER — Other Ambulatory Visit: Payer: Self-pay

## 2020-02-01 ENCOUNTER — Encounter: Payer: Self-pay | Admitting: Family Medicine

## 2020-02-01 VITALS — BP 146/80 | HR 82 | Temp 99.1°F | Resp 20 | Ht 70.0 in | Wt >= 6400 oz

## 2020-02-01 DIAGNOSIS — E1169 Type 2 diabetes mellitus with other specified complication: Secondary | ICD-10-CM | POA: Diagnosis not present

## 2020-02-01 DIAGNOSIS — Z114 Encounter for screening for human immunodeficiency virus [HIV]: Secondary | ICD-10-CM | POA: Diagnosis not present

## 2020-02-01 DIAGNOSIS — Z1159 Encounter for screening for other viral diseases: Secondary | ICD-10-CM

## 2020-02-01 DIAGNOSIS — Z5181 Encounter for therapeutic drug level monitoring: Secondary | ICD-10-CM

## 2020-02-01 DIAGNOSIS — L738 Other specified follicular disorders: Secondary | ICD-10-CM

## 2020-02-01 DIAGNOSIS — E785 Hyperlipidemia, unspecified: Secondary | ICD-10-CM

## 2020-02-01 DIAGNOSIS — E669 Obesity, unspecified: Secondary | ICD-10-CM

## 2020-02-01 DIAGNOSIS — F325 Major depressive disorder, single episode, in full remission: Secondary | ICD-10-CM

## 2020-02-01 DIAGNOSIS — Z130 Encounter for screening for diseases of the blood and blood-forming organs and certain disorders involving the immune mechanism: Secondary | ICD-10-CM | POA: Diagnosis not present

## 2020-02-01 MED ORDER — DIAZEPAM 5 MG PO TABS: ORAL_TABLET | ORAL | 1 refills | Status: DC

## 2020-02-02 ENCOUNTER — Encounter: Payer: Self-pay | Admitting: Family Medicine

## 2020-02-02 DIAGNOSIS — G4733 Obstructive sleep apnea (adult) (pediatric): Secondary | ICD-10-CM

## 2020-02-02 LAB — CBC
HCT: 39.9 % (ref 39.0–52.0)
Hemoglobin: 13.6 g/dL (ref 13.0–17.0)
MCHC: 34 g/dL (ref 30.0–36.0)
MCV: 87.1 fl (ref 78.0–100.0)
Platelets: 263 10*3/uL (ref 150.0–400.0)
RBC: 4.58 Mil/uL (ref 4.22–5.81)
RDW: 14 % (ref 11.5–15.5)
WBC: 8.1 10*3/uL (ref 4.0–10.5)

## 2020-02-02 LAB — LIPID PANEL
Cholesterol: 130 mg/dL (ref 0–200)
HDL: 41.5 mg/dL (ref >39.00)
LDL Cholesterol: 53 mg/dL (ref 0–99)
NonHDL: 88.44
Total CHOL/HDL Ratio: 3
Triglycerides: 178 mg/dL — ABNORMAL HIGH (ref 0.0–149.0)
VLDL: 35.6 mg/dL (ref 0.0–40.0)

## 2020-02-02 LAB — HEMOGLOBIN A1C: Hgb A1c MFr Bld: 6.3 % (ref 4.6–6.5)

## 2020-02-02 LAB — COMPREHENSIVE METABOLIC PANEL
ALT: 24 U/L (ref 0–53)
AST: 22 U/L (ref 0–37)
Albumin: 4.1 g/dL (ref 3.5–5.2)
Alkaline Phosphatase: 86 U/L (ref 39–117)
BUN: 21 mg/dL (ref 6–23)
CO2: 28 mEq/L (ref 19–32)
Calcium: 9 mg/dL (ref 8.4–10.5)
Chloride: 104 mEq/L (ref 96–112)
Creatinine, Ser: 1 mg/dL (ref 0.40–1.50)
GFR: 78.48 mL/min (ref >60.00)
Glucose, Bld: 113 mg/dL — ABNORMAL HIGH (ref 70–99)
Potassium: 4.4 mEq/L (ref 3.5–5.1)
Sodium: 140 mEq/L (ref 135–145)
Total Bilirubin: 0.3 mg/dL (ref 0.2–1.2)
Total Protein: 6.8 g/dL (ref 6.0–8.3)

## 2020-02-02 LAB — HIV ANTIBODY (ROUTINE TESTING W REFLEX): HIV 1&2 Ab, 4th Generation: NONREACTIVE

## 2020-02-02 LAB — HEPATITIS C ANTIBODY
Hepatitis C Ab: NONREACTIVE
SIGNAL TO CUT-OFF: 0.01 (ref <1.00)

## 2020-02-03 DIAGNOSIS — Z23 Encounter for immunization: Secondary | ICD-10-CM | POA: Diagnosis not present

## 2020-02-24 DIAGNOSIS — Z23 Encounter for immunization: Secondary | ICD-10-CM | POA: Diagnosis not present

## 2020-03-17 ENCOUNTER — Other Ambulatory Visit: Payer: Self-pay | Admitting: Family Medicine

## 2020-03-17 DIAGNOSIS — I10 Essential (primary) hypertension: Secondary | ICD-10-CM

## 2020-03-17 DIAGNOSIS — F325 Major depressive disorder, single episode, in full remission: Secondary | ICD-10-CM

## 2020-03-19 ENCOUNTER — Other Ambulatory Visit: Payer: Self-pay

## 2020-04-02 DIAGNOSIS — M25561 Pain in right knee: Secondary | ICD-10-CM | POA: Diagnosis not present

## 2020-04-10 ENCOUNTER — Other Ambulatory Visit: Payer: Self-pay | Admitting: Family Medicine

## 2020-04-10 DIAGNOSIS — K589 Irritable bowel syndrome without diarrhea: Secondary | ICD-10-CM

## 2020-06-21 ENCOUNTER — Ambulatory Visit: Payer: Medicare Other | Admitting: Neurology

## 2020-07-01 NOTE — Progress Notes (Addendum)
[facility] [address], [address] [place], [place] [address] [phone]  Date:  07/04/2020   Name:  Timothy Solis   DOB:  Dec 16, 1965   MRN:  [id number]  PCP:  Pearline Cables, MD    Chief Complaint: Hypertension (Bp concern, reading at home diastolic 52, machine read irregular heart rate, denies chest pain/) and Fatigue   History of Present Illness:  Timothy Solis is a 54 y.o. very pleasant male patient who presents with the following:  Following up on medication/ recheck today Last visit with myself in July- diet controlled diabetes with neuropathy, OSA, obesity, chronic fatigue/encephalomyelitis myalgic encephalomyelitis, dyslipidemia, limited mobility.  He had a positive cologuard last year- ?colonoscopoy done yet Most recent labs in July- Well controlled DM, lipids much better with crestor  He does check his BP at home- his machine reported an irregular pulse on 2 occasions so he decided to come in.  He is not sure if he actually had an irregular pulse-was this is an accurate reading? No unusual palpitations He does have history of chest pain and pressure with exertion- this is not new, he did a nuclear stress in 2019 for same symptoms.  He reports no particular change here.  Symptoms are never present at rest   Pt noted that he had an exacerbation of his chronic fatigue about 3 weeks ago.  Generally he will have fatigue for 3 days or so and then recover. However this time sx have persisted for a longer period of time He has been so fatigued he has not been able to do much at all the last 3 weeks-he is really only able to take care of his cats  He does also have what sounds like some orthostatic hypotension when he goes from sitting to standing  No syncope  His home BP has been generally ok  Eye exam covid vaccine - UTD Flu vaccine- give today   He is taking lisinopril 20 mg for blood pressure  He wonders  about adding a low dose of Abilify (2mg ) to his current regimen for his CFS/ myalgic encephalomyelitis; he has read that this could be helpful It does appear that there is some evidence to suggest Abilify at low doses could be helpful-in any case, depression likely does contribute to his chronic fatigue symptoms.  We agree to try a low-dose of Abilify, 2 mg daily, and see how it works for him  Patient Active Problem List   Diagnosis Date Noted  . Diabetes, polyneuropathy (HCC) 12/14/2018  . OSA on CPAP 12/14/2018  . Paresthesia 12/14/2018  . Diabetes mellitus type 2 in obese (HCC) 07/20/2018  . Costochondritis 09/14/2017  . Dyslipidemia 08/30/2017  . IBS (irritable bowel syndrome) 11/05/2015  . Morbid obesity (HCC) 11/05/2015  . Major depressive disorder with single episode, in remission (HCC) 11/05/2015  . Chronic fatigue 11/05/2015  . Allergic rhinitis, cause unspecified 04/01/2013  . Unspecified asthma(493.90) 04/01/2013    Past Medical History:  Diagnosis Date  . Allergy   . Chronic fatigue syndrome   . Depression   . GERD (gastroesophageal reflux disease)   . HTN (hypertension)   . IBS (irritable bowel syndrome)   . IBS (irritable bowel syndrome)   . Nephrolithiasis   . Obesity     Past Surgical History:  Procedure Laterality Date  . BACK SURGERY    . CHOLECYSTECTOMY    . KNEE SURGERY  Social History   Tobacco Use  . Smoking status: Current Some Day Smoker    Types: Cigars  . Smokeless tobacco: Never Used  Substance Use Topics  . Alcohol use: Yes    Comment: occasional- rare  . Drug use: Never    Family History  Problem Relation Age of Onset  . Coronary artery disease Other   . Cancer Other   . Heart disease Mother   . Multiple sclerosis Father   . Cancer Brother   . Multiple sclerosis Brother   . Heart disease Maternal Grandmother   . Cancer Maternal Grandfather   . Multiple sclerosis Brother   . Multiple sclerosis Maternal Uncle   . Colon  cancer Neg Hx     Allergies  Allergen Reactions  . Ceclor [Cefaclor]     Can't remember what reaction had  . Cyclobenzaprine Other (See Comments)    Serotonin syndrome per pt  . Other Other (See Comments)    Sneezing, watering eyes  . Oxycodone Itching  . Prednisone     Medication list has been reviewed and updated.  Current Outpatient Medications on File Prior to Visit  Medication Sig Dispense Refill  . citalopram (CELEXA) 40 MG tablet Take 1 tablet (40 mg total) by mouth daily. 90 tablet 2  . diazepam (VALIUM) 5 MG tablet TAKE 1 TABLET BY MOUTH EVERY 8 HOURS AS NEEDED FOR ANXIETY 90 tablet 1  . dicyclomine (BENTYL) 20 MG tablet Take 1 tablet by mouth twice a day 120 tablet 2  . lisinopril (ZESTRIL) 20 MG tablet Take 1 tablet (20 mg total) by mouth daily. 90 tablet 2  . pantoprazole (PROTONIX) 20 MG tablet Take 1 tablet (20 mg total) by mouth daily. 90 tablet 3  . rosuvastatin (CRESTOR) 20 MG tablet Take 1 tablet (20 mg total) by mouth daily. 90 tablet 3   No current facility-administered medications on file prior to visit.    Review of Systems:  As per HPI- otherwise negative.   Physical Examination: Vitals:   07/04/20 1300 07/04/20 1303  BP: (!) 160/84 (!) 166/94  Pulse: 90   Resp: 19   SpO2: 97%    Vitals:   07/04/20 1300  Weight: (!) 409 lb (185.5 kg)  Height: 5\' 10"  (1.778 m)   Body mass index is 58.69 kg/m. Ideal Body Weight: Weight in (lb) to have BMI = 25: 173.9  GEN: no acute distress.  Quite obese, otherwise looks well HEENT: Atraumatic, Normocephalic.  Bilateral TM wnl, oropharynx normal.  PEERL,EOMI.   Ears and Nose: No external deformity. CV: RRR, No M/G/R. No JVD. No thrill. No extra heart sounds. PULM: CTA B, no wheezes, crackles, rhonchi. No retractions. No resp. distress. No accessory muscle use. ABD: S, NT, ND, +BS. No rebound. No HSM. EXTR: No c/c/e PSYCH: Normally interactive. Conversant.   EKG- SR, compared with 2019 no acute change  noted  Low voltage in precordial leads is baseline See orthostatic VS  Assessment and Plan: Diabetes mellitus type 2 in obese (HCC) - Plan: Basic metabolic panel, Hemoglobin A1c  Morbid obesity (HCC)  Dyslipidemia  OSA on CPAP  Vitamin D deficiency - Plan: VITAMIN D 25 Hydroxy (Vit-D Deficiency, Fractures)  Screening for prostate cancer - Plan: PSA, Medicare ( [facility] only)  Palpitations - Plan: EKG 12-Lead  SOB (shortness of breath) on exertion - Plan: Troponin I (High Sensitivity)  Chronic fatigue - Plan: ARIPiprazole (ABILIFY) 2 MG tablet  Need for influenza vaccination - Plan: Flu Vaccine QUAD  6+ mos PF IM (Fluarix Quad PF)     Patient today with a couple of concerns. He checks his blood pressure regularly, it is generally within desirable range.  However, his machine has warned him about irregular pulse on a few occasions Is difficult to know if this is accurate I will set him up with a Zio patch monitor for 1 week to evaluate for any arrhythmia  Timothy Solis has complaint of shortness of breath and chest tightness with exertion.  This is concerning, but has been stable for greater than 2 years-he reports no change or worsening since his most recent nuclear stress in February 2019.  We will set him up with cardiology for evaluation,will check a stat troponin today Patient is instructed to seek care if any worsening of these symptoms  Long-term history of chronic fatigue, he is taking Celexa 40 mg for depression Gwynn would like to try 2 mg daily of Abilify as he read this could be helpful for his chronic fatigue.  While I am not aware of any direct evidence, reasonable to try 2 mg daily for a short period of time and see if the patient notes any improvement  Flu shot given  Will plan further follow- up pending labs.  This visit occurred during the SARS-CoV-2 public health emergency.  Safety protocols were in place, including screening questions prior to the visit,  additional usage of staff PPE, and extensive cleaning of exam room while observing appropriate contact time as indicated for disinfecting solutions.    Signed Abbe Amsterdam, MD  Results for orders placed or performed in visit on 07/04/20  Troponin I (High Sensitivity)  Result Value Ref Range   High Sens Troponin I 5 2 - 17 ng/L   Troponin received, message sent to patient-normal   Addendum 12/16, received his labs as below.  Message to patient  Results for orders placed or performed in visit on 07/04/20  Basic metabolic panel  Result Value Ref Range   Sodium 140 135 - 145 mEq/L   Potassium 4.4 3.5 - 5.1 mEq/L   Chloride 105 96 - 112 mEq/L   CO2 26 19 - 32 mEq/L   Glucose, Bld 96 70 - 99 mg/dL   BUN 18 6 - 23 mg/dL   Creatinine, Ser 6.25 0.40 - 1.50 mg/dL   GFR 63.89 >37.34 mL/min   Calcium 9.4 8.4 - 10.5 mg/dL  Hemoglobin K8J  Result Value Ref Range   Hgb A1c MFr Bld 6.7 (H) 4.6 - 6.5 %  VITAMIN D 25 Hydroxy (Vit-D Deficiency, Fractures)  Result Value Ref Range   VITD 13.37 (L) 30.00 - 100.00 ng/mL  PSA, Medicare ( [facility] only)  Result Value Ref Range   PSA 0.39 0.10 - 4.00 ng/ml  Troponin I (High Sensitivity)  Result Value Ref Range   High Sens Troponin I 5 2 - 17 ng/L

## 2020-07-01 NOTE — Patient Instructions (Addendum)
Good to see you again today!  I will be in touch with your labs asap  If anything changes with your symptoms please alert [place] I will have cardiology see you for follow-up and will order the Zio patch for you

## 2020-07-04 ENCOUNTER — Encounter: Payer: Self-pay | Admitting: Family Medicine

## 2020-07-04 ENCOUNTER — Ambulatory Visit (INDEPENDENT_AMBULATORY_CARE_PROVIDER_SITE_OTHER): Payer: Medicare Other | Admitting: Family Medicine

## 2020-07-04 ENCOUNTER — Other Ambulatory Visit: Payer: Self-pay

## 2020-07-04 VITALS — BP 160/84 | HR 90 | Resp 19 | Ht 70.0 in | Wt >= 6400 oz

## 2020-07-04 DIAGNOSIS — E1169 Type 2 diabetes mellitus with other specified complication: Secondary | ICD-10-CM | POA: Diagnosis not present

## 2020-07-04 DIAGNOSIS — R002 Palpitations: Secondary | ICD-10-CM | POA: Diagnosis not present

## 2020-07-04 DIAGNOSIS — Z125 Encounter for screening for malignant neoplasm of prostate: Secondary | ICD-10-CM | POA: Diagnosis not present

## 2020-07-04 DIAGNOSIS — E669 Obesity, unspecified: Secondary | ICD-10-CM | POA: Diagnosis not present

## 2020-07-04 DIAGNOSIS — G4733 Obstructive sleep apnea (adult) (pediatric): Secondary | ICD-10-CM

## 2020-07-04 DIAGNOSIS — R5382 Chronic fatigue, unspecified: Secondary | ICD-10-CM | POA: Diagnosis not present

## 2020-07-04 DIAGNOSIS — Z23 Encounter for immunization: Secondary | ICD-10-CM

## 2020-07-04 DIAGNOSIS — Z9989 Dependence on other enabling machines and devices: Secondary | ICD-10-CM | POA: Diagnosis not present

## 2020-07-04 DIAGNOSIS — R0602 Shortness of breath: Secondary | ICD-10-CM | POA: Diagnosis not present

## 2020-07-04 DIAGNOSIS — E785 Hyperlipidemia, unspecified: Secondary | ICD-10-CM | POA: Diagnosis not present

## 2020-07-04 DIAGNOSIS — E559 Vitamin D deficiency, unspecified: Secondary | ICD-10-CM | POA: Diagnosis not present

## 2020-07-04 LAB — TROPONIN I (HIGH SENSITIVITY): High Sens Troponin I: 5 ng/L (ref 2–17)

## 2020-07-04 MED ORDER — ARIPIPRAZOLE 2 MG PO TABS: 2.0000 mg | ORAL_TABLET | Freq: Every day | ORAL | 2 refills | Status: DC

## 2020-07-05 ENCOUNTER — Encounter: Payer: Self-pay | Admitting: Family Medicine

## 2020-07-05 ENCOUNTER — Ambulatory Visit (INDEPENDENT_AMBULATORY_CARE_PROVIDER_SITE_OTHER): Payer: Medicare Other

## 2020-07-05 DIAGNOSIS — R002 Palpitations: Secondary | ICD-10-CM

## 2020-07-05 LAB — BASIC METABOLIC PANEL
BUN: 18 mg/dL (ref 6–23)
CO2: 26 mEq/L (ref 19–32)
Calcium: 9.4 mg/dL (ref 8.4–10.5)
Chloride: 105 mEq/L (ref 96–112)
Creatinine, Ser: 1.15 mg/dL (ref 0.40–1.50)
GFR: 73.23 mL/min (ref >60.00)
Glucose, Bld: 96 mg/dL (ref 70–99)
Potassium: 4.4 mEq/L (ref 3.5–5.1)
Sodium: 140 mEq/L (ref 135–145)

## 2020-07-05 LAB — VITAMIN D 25 HYDROXY (VIT D DEFICIENCY, FRACTURES): VITD: 13.37 ng/mL — ABNORMAL LOW (ref 30.00–100.00)

## 2020-07-05 LAB — PSA, MEDICARE: PSA: 0.39 ng/ml (ref 0.10–4.00)

## 2020-07-05 LAB — HEMOGLOBIN A1C: Hgb A1c MFr Bld: 6.7 % — ABNORMAL HIGH (ref 4.6–6.5)

## 2020-07-05 MED ORDER — VITAMIN D (ERGOCALCIFEROL) 1.25 MG (50000 UNIT) PO CAPS: 50000.0000 [IU] | ORAL_CAPSULE | ORAL | 3 refills | Status: DC

## 2020-07-05 NOTE — Addendum Note (Signed)
Addended by: Abbe Amsterdam C on: 07/05/2020 12:50 PM   Modules accepted: Orders

## 2020-07-12 ENCOUNTER — Other Ambulatory Visit: Payer: Self-pay | Admitting: Family Medicine

## 2020-07-12 DIAGNOSIS — E785 Hyperlipidemia, unspecified: Secondary | ICD-10-CM

## 2020-07-16 ENCOUNTER — Encounter: Payer: Self-pay | Admitting: Cardiology

## 2020-07-16 ENCOUNTER — Ambulatory Visit (INDEPENDENT_AMBULATORY_CARE_PROVIDER_SITE_OTHER): Payer: Medicare Other | Admitting: Cardiology

## 2020-07-16 ENCOUNTER — Other Ambulatory Visit: Payer: Self-pay

## 2020-07-16 VITALS — BP 156/72 | HR 86 | Ht 70.0 in | Wt >= 6400 oz

## 2020-07-16 DIAGNOSIS — G4733 Obstructive sleep apnea (adult) (pediatric): Secondary | ICD-10-CM

## 2020-07-16 DIAGNOSIS — E785 Hyperlipidemia, unspecified: Secondary | ICD-10-CM

## 2020-07-16 DIAGNOSIS — F325 Major depressive disorder, single episode, in full remission: Secondary | ICD-10-CM | POA: Diagnosis not present

## 2020-07-16 DIAGNOSIS — F32A Depression, unspecified: Secondary | ICD-10-CM | POA: Diagnosis not present

## 2020-07-16 DIAGNOSIS — J45901 Unspecified asthma with (acute) exacerbation: Secondary | ICD-10-CM

## 2020-07-16 DIAGNOSIS — R202 Paresthesia of skin: Secondary | ICD-10-CM | POA: Diagnosis not present

## 2020-07-16 DIAGNOSIS — R0789 Other chest pain: Secondary | ICD-10-CM

## 2020-07-16 DIAGNOSIS — R079 Chest pain, unspecified: Secondary | ICD-10-CM | POA: Diagnosis not present

## 2020-07-16 DIAGNOSIS — E1169 Type 2 diabetes mellitus with other specified complication: Secondary | ICD-10-CM | POA: Diagnosis not present

## 2020-07-16 DIAGNOSIS — Z9989 Dependence on other enabling machines and devices: Secondary | ICD-10-CM | POA: Diagnosis not present

## 2020-07-16 DIAGNOSIS — E669 Obesity, unspecified: Secondary | ICD-10-CM

## 2020-07-16 DIAGNOSIS — I10 Essential (primary) hypertension: Secondary | ICD-10-CM | POA: Diagnosis not present

## 2020-07-16 HISTORY — DX: Other chest pain: R07.89

## 2020-07-16 MED ORDER — METOPROLOL TARTRATE 25 MG PO TABS: 25.0000 mg | ORAL_TABLET | Freq: Two times a day (BID) | ORAL | 3 refills | Status: DC

## 2020-07-16 MED ORDER — NITROGLYCERIN 0.4 MG SL SUBL: 0.4000 mg | SUBLINGUAL_TABLET | SUBLINGUAL | 3 refills | Status: DC | PRN

## 2020-07-16 NOTE — Progress Notes (Signed)
Cardiology Consultation:    Date:  07/16/2020   ID:  Timothy Solis, DOB 1966-05-21, MRN [id number]  PCP:  Pearline Cables, MD  Cardiologist:  Gypsy Balsam, MD   Referring MD: Pearline Cables, MD   Chief Complaint  Patient presents with  . Chest Pain  . Irregular Heart Beat  . Hypertension    History of Present Illness:    Timothy Solis is a 54 y.o. male who is being seen today for the evaluation of irregular heartbeats and chest pain at the request of Copland, Gwenlyn Found, MD.  He is a morbidly obese gentleman with essential hypertension, diabetes, dyslipidemia, he smokes cigar.  He was referred to [place] because of episode of palpitation as well as chest pain.  Actually palpitations he does not feel much but sometimes when he check his blood pressure he will notice irregularities.  He does have apple watch and records EKG quite frequently however so far did not record any significant arrhythmia.  Recently he were 7 days Zio patch he sent it back yesterday waiting for results of it.  He also described to have chest tightness which happen with exertion.  He said he is having for at least 8 years.  3 years ago he did have a stress test which showed no evidence of ischemia this is became a little more frequent lately since he was bedridden for a few weeks because of sickness then when he started moving around he developed more chest pain but gradually getting better.  Pain usually last up to 2 minutes.  He said if he continued walking pain became quite severe again there is no worsening overall intensity of the pain and frequency within the last few days. He does not have family history of premature coronary artery disease, there is some history of coronary artery disease but not premature. He smokes cigars sometimes, Does have dyslipidemia which is perfectly controlled with statin Does have history of hypertension and dyslipidemia as well as diabetes.  Past Medical History:   Diagnosis Date  . Allergy   . Chronic fatigue syndrome   . Depression   . GERD (gastroesophageal reflux disease)   . HTN (hypertension)   . IBS (irritable bowel syndrome)   . IBS (irritable bowel syndrome)   . Nephrolithiasis   . Obesity     Past Surgical History:  Procedure Laterality Date  . BACK SURGERY    . CHOLECYSTECTOMY    . KNEE SURGERY      Current Medications: Current Meds  Medication Sig  . ARIPiprazole (ABILIFY) 2 MG tablet Take 1 tablet (2 mg total) by mouth daily.  . citalopram (CELEXA) 40 MG tablet Take 1 tablet (40 mg total) by mouth daily.  . diazepam (VALIUM) 5 MG tablet TAKE 1 TABLET BY MOUTH EVERY 8 HOURS AS NEEDED FOR ANXIETY  . dicyclomine (BENTYL) 20 MG tablet Take 1 tablet by mouth twice a day  . lisinopril (ZESTRIL) 20 MG tablet Take 1 tablet (20 mg total) by mouth daily.  . pantoprazole (PROTONIX) 20 MG tablet Take 1 tablet (20 mg total) by mouth daily.  . rosuvastatin (CRESTOR) 20 MG tablet Take 1 tablet (20 mg total) by mouth daily.  . Vitamin D, Ergocalciferol, (DRISDOL) 1.25 MG (50000 UNIT) CAPS capsule Take 1 capsule (50,000 Units total) by mouth every 7 (seven) days.     Allergies:   Ceclor [cefaclor], Cyclobenzaprine, Other, Oxycodone, and Prednisone   Social History   Socioeconomic History  .  Marital status: Divorced    Spouse name: Not on file  . Number of children: 0  . Years of education: 11  . Highest education level: Not on file  Occupational History  . Occupation: Disability  Tobacco Use  . Smoking status: Current Some Day Smoker    Types: Cigars  . Smokeless tobacco: Never Used  Substance and Sexual Activity  . Alcohol use: Yes    Comment: occasional- rare  . Drug use: Never  . Sexual activity: Not Currently  Other Topics Concern  . Not on file  Social History Narrative   Right handed    Caffeine use: Coffee/tea/soda daily   Lives alone   Social Determinants of Health   Financial Resource Strain: Not on file   Food Insecurity: Not on file  Transportation Needs: Not on file  Physical Activity: Not on file  Stress: Not on file  Social Connections: Not on file     Family History: The patient's family history includes Cancer in his brother, maternal grandfather, and another family member; Coronary artery disease in an other family member; Heart disease in his maternal grandmother and mother; Multiple sclerosis in his brother, brother, father, and maternal uncle. There is no history of Colon cancer. ROS:   Please see the history of present illness.    All 14 point review of systems negative except as described per history of present illness.  EKGs/Labs/Other Studies Reviewed:    The following studies were reviewed today: Stress test reviewed from 2019 showing no evidence of ischemia  EKG:  EKG is  ordered today.  The ekg ordered today demonstrates normal sinus rhythm low voltage normal QS complex rate morphology no ST segment changes  Recent Labs: 02/01/2020: ALT 24; Hemoglobin 13.6; Platelets 263.0 07/04/2020: BUN 18; Creatinine, Ser 1.15; Potassium 4.4; Sodium 140  Recent Lipid Panel    Component Value Date/Time   CHOL 130 02/01/2020 1433   TRIG 178.0 (H) 02/01/2020 1433   HDL 41.50 02/01/2020 1433   CHOLHDL 3 02/01/2020 1433   VLDL 35.6 02/01/2020 1433   LDLCALC 53 02/01/2020 1433   LDLDIRECT 136.0 05/19/2019 1341    Physical Exam:    VS:  BP (!) 156/72 (BP Location: Right Arm, Patient Position: Sitting)   Pulse 86   Ht 5\' 10"  (1.778 m)   Wt (!) 406 lb (184.2 kg)   SpO2 92%   BMI 58.25 kg/m     Wt Readings from Last 3 Encounters:  07/16/20 (!) 406 lb (184.2 kg)  07/04/20 (!) 409 lb (185.5 kg)  02/01/20 (!) 406 lb (184.2 kg)     GEN:  Well nourished, well developed in no acute distress HEENT: Normal NECK: No JVD; No carotid bruits LYMPHATICS: No lymphadenopathy CARDIAC: RRR, no murmurs, no rubs, no gallops RESPIRATORY:  Clear to auscultation without rales, wheezing or  rhonchi  ABDOMEN: Soft, non-tender, non-distended MUSCULOSKELETAL:  No edema; No deformity  SKIN: Warm and dry NEUROLOGIC:  Alert and oriented x 3 PSYCHIATRIC:  Normal affect   ASSESSMENT:    1. Hypertension, unspecified type   2. OSA on CPAP   3. Exacerbation of asthma, unspecified asthma severity, unspecified whether persistent   4. Diabetes mellitus type 2 in obese (HCC)   5. Morbid obesity (HCC)   6. Major depressive disorder with single episode, in remission (HCC)   7. Dyslipidemia   8. Paresthesia   9. Depression, unspecified depression type   10. Chest pain of uncertain etiology   11. Atypical chest pain  PLAN:    In order of problems listed above:  1. Chest pain, that has some worrisome characteristic.  Interestingly he did have a stress test done few years ago which was negative.  He also described to have that type of chest pain for years.  I think we need to evaluate for coronary artery disease.  He is already on statin he is already on aspirin which I will continue.  I will add metoprolol tartrate 25 twice daily to his medical regiment, I will also ask him to start taking nitroglycerin on as-needed basis.  In terms of the weighted we will evaluate him for coronary artery disease as make this decision based on results of echocardiogram as well as Zio patch.  He was told to go to the emergency room if nitroglycerin does not relieve the pain. 2. Obstructive sleep apnea: He does use CPAP mask and is very happy with it. 3. Diabetes mellitus that being followed by internal medicine team.  His last hemoglobin A1c from 07/04/2020 is 6.7.  Excellent control. 4. Dyslipidemia: His last fasting lipid profile from 02/01/2020 show LDL 50 3L HDL 41.  Continue present management with statin. 5. Palpitations: He did wear Zio patch will wait for results of it also ask him to check his EKG by doing a recording with his apple watch from time to time.  Especially when he does have recording of  his blood pressure telling him irregular heartbeats. 6. Morbid obesity obviously speak problem he understands he is talking about potentially having gastric bypass surgery.    Medication Adjustments/Labs and Tests Ordered: Current medicines are reviewed at length with the patient today.  Concerns regarding medicines are outlined above.  Orders Placed This Encounter  Procedures  . EKG 12-Lead   No orders of the defined types were placed in this encounter.   Signed, Georgeanna Lea, MD, [facility]. 07/16/2020 2:17 PM    [facility]

## 2020-07-16 NOTE — Patient Instructions (Signed)
Medication Instructions:  Your physician has recommended you make the following change in your medication:  START: Metoprolol tartrate 25 mg take one tablet by mouth twice daily.  START: Nitroglycerin 0.4 mg take one tablet by mouth every 5 minutes up to three times as needed for chest pain *If you need a refill on your cardiac medications before your next appointment, please call your pharmacy*   Lab Work: None If you have labs (blood work) drawn today and your tests are completely normal, you will receive your results only by:  MyChart Message (if you have MyChart) OR  A paper copy in the mail If you have any lab test that is abnormal or we need to change your treatment, we will call you to review the results.   Testing/Procedures: Your physician has requested that you have an echocardiogram. Echocardiography is a painless test that uses sound waves to create images of your heart. It provides your doctor with information about the size and shape of your heart and how well your hearts chambers and valves are working. This procedure takes approximately one hour. There are no restrictions for this procedure.     Follow-Up: At [facility], you and your health needs are our priority.  As part of our continuing mission to provide you with exceptional heart care, we have created designated Provider Care Teams.  These Care Teams include your primary Cardiologist (physician) and Advanced Practice Providers (APPs -  Physician Assistants and Nurse Practitioners) who all work together to provide you with the care you need, when you need it.  We recommend signing up for the patient portal called "MyChart".  Sign up information is provided on this After Visit Summary.  MyChart is used to connect with patients for Virtual Visits (Telemedicine).  Patients are able to view lab/test results, encounter notes, upcoming appointments, etc.  Non-urgent messages can be sent to your provider as well.   To  learn more about what you can do with MyChart, go to [email or web address].    Your next appointment:   6 week(s)  The format for your next appointment:   In Person  Provider:   Gypsy Balsam, MD   Other Instructions

## 2020-07-16 NOTE — Addendum Note (Signed)
Addended by: Delorse Limber I on: 07/16/2020 02:25 PM   Modules accepted: Orders

## 2020-08-10 ENCOUNTER — Ambulatory Visit (HOSPITAL_BASED_OUTPATIENT_CLINIC_OR_DEPARTMENT_OTHER)
Admission: RE | Admit: 2020-08-10 | Discharge: 2020-08-10 | Disposition: A | Discharge status: Home / Self Care | Payer: Medicare Other | Source: Ambulatory Visit | Attending: Cardiology | Admitting: Cardiology

## 2020-08-10 ENCOUNTER — Other Ambulatory Visit: Payer: Self-pay

## 2020-08-10 DIAGNOSIS — I10 Essential (primary) hypertension: Secondary | ICD-10-CM | POA: Insufficient documentation

## 2020-08-10 LAB — ECHOCARDIOGRAM COMPLETE
Area-P 1/2: 3.77 cm2
S' Lateral: 3 cm
Single Plane A4C EF: 52.3 %

## 2020-08-10 MED ORDER — PERFLUTREN LIPID MICROSPHERE
1.0000 mL | INTRAVENOUS | Status: AC | PRN
  Administered 2020-08-10: 2 mL via INTRAVENOUS
  Filled 2020-08-10: qty 10

## 2020-08-17 DIAGNOSIS — T7840XA Allergy, unspecified, initial encounter: Secondary | ICD-10-CM | POA: Insufficient documentation

## 2020-08-17 DIAGNOSIS — G9332 Myalgic encephalomyelitis/chronic fatigue syndrome: Secondary | ICD-10-CM | POA: Insufficient documentation

## 2020-08-17 DIAGNOSIS — I10 Essential (primary) hypertension: Secondary | ICD-10-CM | POA: Insufficient documentation

## 2020-08-17 DIAGNOSIS — E669 Obesity, unspecified: Secondary | ICD-10-CM | POA: Insufficient documentation

## 2020-08-17 DIAGNOSIS — N200 Calculus of kidney: Secondary | ICD-10-CM | POA: Insufficient documentation

## 2020-08-17 DIAGNOSIS — K219 Gastro-esophageal reflux disease without esophagitis: Secondary | ICD-10-CM | POA: Insufficient documentation

## 2020-08-17 DIAGNOSIS — R5382 Chronic fatigue, unspecified: Secondary | ICD-10-CM | POA: Insufficient documentation

## 2020-08-17 DIAGNOSIS — K589 Irritable bowel syndrome without diarrhea: Secondary | ICD-10-CM | POA: Insufficient documentation

## 2020-08-21 ENCOUNTER — Encounter: Payer: Self-pay | Admitting: Cardiology

## 2020-08-21 ENCOUNTER — Ambulatory Visit (INDEPENDENT_AMBULATORY_CARE_PROVIDER_SITE_OTHER): Payer: Medicare Other | Admitting: Cardiology

## 2020-08-21 ENCOUNTER — Other Ambulatory Visit: Payer: Self-pay

## 2020-08-21 VITALS — BP 136/90 | HR 60 | Ht 70.0 in | Wt >= 6400 oz

## 2020-08-21 DIAGNOSIS — I471 Supraventricular tachycardia, unspecified: Secondary | ICD-10-CM

## 2020-08-21 DIAGNOSIS — E1142 Type 2 diabetes mellitus with diabetic polyneuropathy: Secondary | ICD-10-CM

## 2020-08-21 DIAGNOSIS — R0789 Other chest pain: Secondary | ICD-10-CM | POA: Diagnosis not present

## 2020-08-21 DIAGNOSIS — R5382 Chronic fatigue, unspecified: Secondary | ICD-10-CM

## 2020-08-21 HISTORY — DX: Supraventricular tachycardia: I47.1

## 2020-08-21 HISTORY — DX: Supraventricular tachycardia, unspecified: I47.10

## 2020-08-21 NOTE — Progress Notes (Signed)
Cardiology Office Note:    Date:  08/21/2020   ID:  TODRICK SIEDSCHLAG, DOB Mar 31, 1966, MRN [id number]  PCP:  Pearline Cables, MD  Cardiologist:  Gypsy Balsam, MD    Referring MD: Pearline Cables, MD   Chief Complaint  Patient presents with  . Follow-up  I am doing much better  History of Present Illness:    Timothy Solis is a 55 y.o. male with past medical history significant for morbid obesity, diabetes, essential hypertension, dyslipidemia, was referred to [place] originally because of some palpitations or right or recorded by apple watch irregularity of the heart rate as well as some atypical chest pain.  He did have echocardiogram done which showed preserved left ventricle ejection fraction, he also wore monitor which showed some supraventricular tachycardia.  I did prescribe small dose of beta-blocker which seems to be helping quite nicely with palpitations.  He does not see any more of irregularity, also helps with episode of chest pain.  Chest pain happen in different scenarios sometimes at rest sometimes with exercise.  In 2019 he did have a stress test which was negative for exercise-induced myocardial ischemia, however, he does have multiple risk factors for coronary artery disease.  Today he approached me with the fact that he is thinking about having gastric bypass surgery done.  He required a gastroscopy and if gastroscopy is fine then he will be scheduled for procedure.  I told him to let me know about 3 weeks before the procedure and then I will talk to him again to see how he does.  He does have poor exercise tolerance because of pain in multiple joints.  He was also diagnosed with fibromyalgia which complicate his situation quite a bit.  Obviously he does have multiple risk factors for coronary artery disease.  Past Medical History:  Diagnosis Date  . Allergic rhinitis 04/01/2013  . Allergy   . Asthma 04/01/2013   [facility] Dr Tenny Craw Spirometry done 64%  predicted mild reduction/ no obstruction   . Atypical chest pain 07/16/2020  . Chronic fatigue 11/05/2015  . Chronic fatigue syndrome   . Costochondritis 09/14/2017  . Depression   . Diabetes mellitus type 2 in obese (HCC) 07/20/2018  . Diabetes, polyneuropathy (HCC) 12/14/2018  . Dyslipidemia 08/30/2017  . Gastroesophageal reflux disease 08/20/2012  . GERD (gastroesophageal reflux disease)   . HTN (hypertension)   . Hypertension 08/20/2012  . IBS (irritable bowel syndrome)   . IBS (irritable bowel syndrome)   . Irritable bowel syndrome 08/20/2012  . Lumbar disc disease with radiculopathy 06/20/2015   Formatting of this note might be different from the original. L4-5 Disc bulge with left sided pressure on nerve.  No disc rupture  Last Assessment & Plan:  Formatting of this note might be different from the original. Relevant Hx: Course: Daily Update: Today's Plan:  Electronically signed by: Iven Finn II, PA-C 06/20/15 1459  . Major depressive disorder with single episode, in remission (HCC) 11/05/2015  . Morbid obesity (HCC) 08/20/2012  . Nephrolithiasis   . Obesity   . OSA on CPAP 12/14/2018  . Paresthesia 12/14/2018    Past Surgical History:  Procedure Laterality Date  . BACK SURGERY    . CHOLECYSTECTOMY    . KNEE SURGERY      Current Medications: Current Meds  Medication Sig  . ARIPiprazole (ABILIFY) 2 MG tablet Take 1 tablet (2 mg total) by mouth daily.  . citalopram (CELEXA) 40 MG tablet Take  1 tablet (40 mg total) by mouth daily.  . diazepam (VALIUM) 5 MG tablet TAKE 1 TABLET BY MOUTH EVERY 8 HOURS AS NEEDED FOR ANXIETY  . dicyclomine (BENTYL) 20 MG tablet Take 1 tablet by mouth twice a day  . lisinopril (ZESTRIL) 20 MG tablet Take 1 tablet (20 mg total) by mouth daily.  . metoprolol tartrate (LOPRESSOR) 25 MG tablet Take 1 tablet (25 mg total) by mouth 2 (two) times daily.  . nitroGLYCERIN (NITROSTAT) 0.4 MG SL tablet Place 1 tablet (0.4 mg total) under the tongue every  5 (five) minutes as needed for chest pain.  . pantoprazole (PROTONIX) 20 MG tablet Take 1 tablet (20 mg total) by mouth daily.  . rosuvastatin (CRESTOR) 20 MG tablet Take 1 tablet (20 mg total) by mouth daily.  . Vitamin D, Ergocalciferol, (DRISDOL) 1.25 MG (50000 UNIT) CAPS capsule Take 1 capsule (50,000 Units total) by mouth every 7 (seven) days.     Allergies:   Ceclor [cefaclor], Cyclobenzaprine, Other, Oxycodone, and Prednisone   Social History   Socioeconomic History  . Marital status: Divorced    Spouse name: Not on file  . Number of children: 0  . Years of education: 39  . Highest education level: Not on file  Occupational History  . Occupation: Disability  Tobacco Use  . Smoking status: Former Smoker    Types: Cigars    Quit date: 04/20/2020    Years since quitting: 0.3  . Smokeless tobacco: Never Used  Substance and Sexual Activity  . Alcohol use: Yes    Comment: occasional- rare  . Drug use: Never  . Sexual activity: Not Currently  Other Topics Concern  . Not on file  Social History Narrative   Right handed    Caffeine use: Coffee/tea/soda daily   Lives alone   Social Determinants of Health   Financial Resource Strain: Not on file  Food Insecurity: Not on file  Transportation Needs: Not on file  Physical Activity: Not on file  Stress: Not on file  Social Connections: Not on file     Family History: The patient's family history includes Cancer in his brother, maternal grandfather, and another family member; Coronary artery disease in an other family member; Heart disease in his maternal grandmother and mother; Multiple sclerosis in his brother, brother, father, and maternal uncle. There is no history of Colon cancer. ROS:   Please see the history of present illness.    All 14 point review of systems negative except as described per history of present illness  EKGs/Labs/Other Studies Reviewed:      Recent Labs: 02/01/2020: ALT 24; Hemoglobin 13.6;  Platelets 263.0 07/04/2020: BUN 18; Creatinine, Ser 1.15; Potassium 4.4; Sodium 140  Recent Lipid Panel    Component Value Date/Time   CHOL 130 02/01/2020 1433   TRIG 178.0 (H) 02/01/2020 1433   HDL 41.50 02/01/2020 1433   CHOLHDL 3 02/01/2020 1433   VLDL 35.6 02/01/2020 1433   LDLCALC 53 02/01/2020 1433   LDLDIRECT 136.0 05/19/2019 1341    Physical Exam:    VS:  BP 136/90 (BP Location: Right Arm, Patient Position: Sitting)   Pulse 60   Ht 5\' 10"  (1.778 m)   Wt (!) 421 lb (191 kg)   SpO2 94%   BMI 60.41 kg/m     Wt Readings from Last 3 Encounters:  08/21/20 (!) 421 lb (191 kg)  07/16/20 (!) 406 lb (184.2 kg)  07/04/20 (!) 409 lb (185.5 kg)  GEN:  Well nourished, well developed in no acute distress HEENT: Normal NECK: No JVD; No carotid bruits LYMPHATICS: No lymphadenopathy CARDIAC: RRR, no murmurs, no rubs, no gallops RESPIRATORY:  Clear to auscultation without rales, wheezing or rhonchi  ABDOMEN: Soft, non-tender, non-distended MUSCULOSKELETAL:  No edema; No deformity  SKIN: Warm and dry LOWER EXTREMITIES: no swelling NEUROLOGIC:  Alert and oriented x 3 PSYCHIATRIC:  Normal affect   ASSESSMENT:    1. Diabetic polyneuropathy associated with type 2 diabetes mellitus (HCC)   2. Chronic fatigue   3. Atypical chest pain   4. Supraventricular tachycardia (HCC)    PLAN:    In order of problems listed above:  1. Atypical chest pain completely suppressed with small dose of beta-blocker which I will continue.  We will continue also antiplatelets therapy in form of aspirin. 2. Dyslipidemia I did review his K PN which show me his LDL of 53 HDL 41 this is on Crestor 20 which I will continue. 3. Palpitations/SVT.  Suppressed with beta-blocker which I will continue. 4. Potential cardiovascular preop evaluation he is getting ready to talk to surgeons about potentially doing upper gastroscopy and then potentially schedule him for gastric bypass surgery.   Medication  Adjustments/Labs and Tests Ordered: Current medicines are reviewed at length with the patient today.  Concerns regarding medicines are outlined above.  No orders of the defined types were placed in this encounter.  Medication changes: No orders of the defined types were placed in this encounter.   Signed, Georgeanna Lea, MD, [facility] 08/21/2020 3:34 PM    [facility]

## 2020-08-21 NOTE — Patient Instructions (Signed)
Medication Instructions:  Your physician recommends that you continue on your current medications as directed. Please refer to the Current Medication list given to you today.  *If you need a refill on your cardiac medications before your next appointment, please call your pharmacy*   Lab Work: [place] If you have labs (blood work) drawn today and your tests are completely normal, you will receive your results only by: . MyChart Message (if you have MyChart) OR . A paper copy in the mail If you have any lab test that is abnormal or we need to change your treatment, we will call you to review the results.   Testing/Procedures: None   Follow-Up: At [facility], you and your health needs are our priority.  As part of our continuing mission to provide you with exceptional heart care, we have created designated Provider Care Teams.  These Care Teams include your primary Cardiologist (physician) and Advanced Practice Providers (APPs -  Physician Assistants and Nurse Practitioners) who all work together to provide you with the care you need, when you need it.  We recommend signing up for the patient portal called "MyChart".  Sign up information is provided on this After Visit Summary.  MyChart is used to connect with patients for Virtual Visits (Telemedicine).  Patients are able to view lab/test results, encounter notes, upcoming appointments, etc.  Non-urgent messages can be sent to your provider as well.   To learn more about what you can do with MyChart, go to [email or web address].    Your next appointment:   3 month(s)  The format for your next appointment:   In Person  Provider:   Robert Krasowski, MD   Other Instructions   

## 2020-09-05 ENCOUNTER — Encounter: Payer: Self-pay | Admitting: Gastroenterology

## 2020-09-05 ENCOUNTER — Ambulatory Visit (INDEPENDENT_AMBULATORY_CARE_PROVIDER_SITE_OTHER): Payer: Medicare Other | Admitting: Gastroenterology

## 2020-09-05 VITALS — BP 132/70 | HR 73 | Ht 70.0 in | Wt >= 6400 oz

## 2020-09-05 DIAGNOSIS — K219 Gastro-esophageal reflux disease without esophagitis: Secondary | ICD-10-CM | POA: Diagnosis not present

## 2020-09-05 DIAGNOSIS — K580 Irritable bowel syndrome with diarrhea: Secondary | ICD-10-CM | POA: Diagnosis not present

## 2020-09-05 DIAGNOSIS — R195 Other fecal abnormalities: Secondary | ICD-10-CM

## 2020-09-05 NOTE — Patient Instructions (Signed)
If you are age 53 or older, your body mass index should be between 23-30. Your Body mass index is 59.13 kg/m. If this is out of the aforementioned range listed, please consider follow up with your Primary Care Provider.  If you are age 29 or younger, your body mass index should be between 19-25. Your Body mass index is 59.13 kg/m. If this is out of the aformentioned range listed, please consider follow up with your Primary Care Provider.   Continue Bentyl, Protonix   Continue Gluten Free Diet   It has been recommended to you by your physician that you have a(n) EGD/Colonoscopy completed. We did not schedule the procedure(s) today. You will be contacted when a date and time for this procedure is available.  Thank you,  Dr. Lynann Bologna

## 2020-09-05 NOTE — Progress Notes (Signed)
Chief Complaint:   Referring Provider:  Pearline Cables, MD      ASSESSMENT AND PLAN;   #1. GERD  #2. Positive cologuard test 2019.  He did not get colonoscopy at that time  #3. IBS with diarrhea (better with gluten free diet)  Plan: - Protonix 20mg  to continue. - EGD with SB bx/colon at [facility] with 2 day prep. (BMI>50).  I have explained the risks and benefits including small but definite risks of perforation, bleeding, aspiration.  Benefits were also discussed.  He wishes to proceed. - Continue bentyl for now. - Continue gluten free diet. - Encouraged weight loss. -Thinking about bariatric Sx. He will do research regarding bariatric surgeons and let [facility] know.    HPI:    Timothy Solis is a 55 y.o. male  With multiple medical problems as detailed below including morbid obesity BMI>50, chronic fatigue syndrome, GERD, DM2 with neuropathy, HTN, HLD, anxiety/depression.  With reflux.  Has been on omeprazole in the past.  Currently on Protonix 20 mg p.o. once a day with good relief.  He is thinking about bariatric surgery and would like to get EGD performed.  He had positive Cologuard screening test in 2019.  He was scheduled to have EGD/colonoscopy but never got around due to family/health issues.  Would occasionally notice some rectal bleeding attributed to hemorrhoids.  Has been diagnosed with IBS with predominant diarrhea.  Would have diarrhea intermittently at the frequency of 2-3 times/day only 2-3 times per week.  Certainly much better since he has been on gluten-free diet.  He had multiple allergies.  All allergies are better since he has been on gluten-free diet.  No family history of celiac disease.  Has longstanding history of heartburn.  No odynophagia or dysphagia.  Has also been recommended to get EGD performed.  Denies having any significant abdominal pain.  No bloating.  No melena.  No recent weight loss.   Additional PMH: OSA on CPAP Gout Had colon  over 17 yrs ago. Past Medical History:  Diagnosis Date  . Allergic rhinitis 04/01/2013  . Allergy   . Asthma 04/01/2013   [facility] 06/01/2013 Spirometry done 64% predicted mild reduction/ no obstruction   . Atypical chest pain 07/16/2020  . Chronic fatigue 11/05/2015  . Chronic fatigue syndrome   . Costochondritis 09/14/2017  . Depression   . Diabetes mellitus type 2 in obese (HCC) 07/20/2018  . Diabetes, polyneuropathy (HCC) 12/14/2018  . Dyslipidemia 08/30/2017  . Gastroesophageal reflux disease 08/20/2012  . GERD (gastroesophageal reflux disease)   . HTN (hypertension)   . Hypertension 08/20/2012  . IBS (irritable bowel syndrome)   . IBS (irritable bowel syndrome)   . Irritable bowel syndrome 08/20/2012  . Lumbar disc disease with radiculopathy 06/20/2015   Formatting of this note might be different from the original. L4-5 Disc bulge with left sided pressure on nerve.  No disc rupture  Last Assessment & Plan:  Formatting of this note might be different from the original. Relevant Hx: Course: Daily Update: Today's Plan:  Electronically signed by: 06/22/2015 II, PA-C 06/20/15 1459  . Major depressive disorder with single episode, in remission (HCC) 11/05/2015  . Morbid obesity (HCC) 08/20/2012  . Nephrolithiasis   . Obesity   . OSA on CPAP 12/14/2018  . Paresthesia 12/14/2018    Past Surgical History:  Procedure Laterality Date  . BACK SURGERY    . CHOLECYSTECTOMY    . KNEE SURGERY  Family History  Problem Relation Age of Onset  . Coronary artery disease Other   . Cancer Other   . Heart disease Mother   . Multiple sclerosis Father   . Cancer Brother   . Multiple sclerosis Brother   . Heart disease Maternal Grandmother   . Cancer Maternal Grandfather   . Multiple sclerosis Brother   . Multiple sclerosis Maternal Uncle   . Colon cancer Neg Hx     Social History   Tobacco Use  . Smoking status: Former Smoker    Types: Cigars    Quit date:  04/20/2020    Years since quitting: 0.3  . Smokeless tobacco: Never Used  Substance Use Topics  . Alcohol use: Yes    Comment: occasional- rare  . Drug use: Never    Current Outpatient Medications  Medication Sig Dispense Refill  . citalopram (CELEXA) 40 MG tablet Take 1 tablet (40 mg total) by mouth daily. 90 tablet 2  . diazepam (VALIUM) 5 MG tablet TAKE 1 TABLET BY MOUTH EVERY 8 HOURS AS NEEDED FOR ANXIETY 90 tablet 1  . dicyclomine (BENTYL) 20 MG tablet Take 1 tablet by mouth twice a day 120 tablet 2  . lisinopril (ZESTRIL) 20 MG tablet Take 1 tablet (20 mg total) by mouth daily. 90 tablet 2  . metoprolol tartrate (LOPRESSOR) 25 MG tablet Take 1 tablet (25 mg total) by mouth 2 (two) times daily. 180 tablet 3  . nitroGLYCERIN (NITROSTAT) 0.4 MG SL tablet Place 1 tablet (0.4 mg total) under the tongue every 5 (five) minutes as needed for chest pain. 90 tablet 3  . pantoprazole (PROTONIX) 20 MG tablet Take 1 tablet (20 mg total) by mouth daily. 90 tablet 3  . rosuvastatin (CRESTOR) 20 MG tablet Take 1 tablet (20 mg total) by mouth daily. 90 tablet 3  . Vitamin D, Ergocalciferol, (DRISDOL) 1.25 MG (50000 UNIT) CAPS capsule Take 1 capsule (50,000 Units total) by mouth every 7 (seven) days. 13 capsule 3   No current facility-administered medications for this visit.    Allergies  Allergen Reactions  . Ceclor [Cefaclor]     Can't remember what reaction had  . Cyclobenzaprine Other (See Comments)    Serotonin syndrome per pt  . Other Other (See Comments)    Sneezing, watering eyes  . Oxycodone Itching  . Prednisone     Review of Systems:  Constitutional: Denies fever, chills, diaphoresis, appetite change and fatigue.  HEENT: had multiple allergies - better .   Respiratory: Denies SOB, DOE, cough, chest tightness,  and wheezing.   Cardiovascular: Denies chest pain, palpitations and leg swelling.  Genitourinary: Denies dysuria, urgency, frequency, hematuria, flank pain and difficulty  urinating.  Musculoskeletal: Denies myalgias, back pain, joint swelling, arthralgias and gait problem.  Skin: No rash.  Neurological: Denies dizziness, seizures, syncope, weakness, light-headedness, numbness and headaches.  Hematological: Denies adenopathy. Easy bruising, personal or family bleeding history  Psychiatric/Behavioral: Has anxiety or depression     Physical Exam:    BP 132/70   Pulse 73   Ht 5\' 10"  (1.778 m)   Wt (!) 412 lb 2 oz (186.9 kg)   BMI 59.13 kg/m  Filed Weights   09/05/20 1408  Weight: (!) 412 lb 2 oz (186.9 kg)  tele   Data Reviewed: I have personally reviewed following labs and imaging studies  CBC: CBC Latest Ref Rng & Units 02/01/2020 05/19/2019 12/14/2018  WBC 4.0 - 10.5 K/uL 8.1 8.7 8.6  Hemoglobin 13.0 - 17.0  g/dL 17.3 56.7 01.4  Hematocrit 39.0 - 52.0 % 39.9 44.3 43.0  Platelets 150.0 - 400.0 K/uL 263.0 310.0 257    CMP: CMP Latest Ref Rng & Units 07/04/2020 02/01/2020 05/19/2019  Glucose 70 - 99 mg/dL 96 103(U) 89  BUN 6 - 23 mg/dL 18 21 17   Creatinine 0.40 - 1.50 mg/dL 1.31 4.38  Sodium 135 - 145 mEq/L 140 140 141  Potassium 3.5 - 5.1 mEq/L 4.4 4.4 4.2  Chloride 96 - 112 mEq/L 105 104 102  CO2 19 - 32 mEq/L 26 28 31   Calcium 8.4 - 10.5 mg/dL 9.4 9.0 9.6  Total Protein 6.0 - 8.3 g/dL - 6.8 7.4  Total Bilirubin 0.2 - 1.2 mg/dL - 0.3 0.5  Alkaline Phos 39 - 117 U/L - 86 88  AST 0 - 37 U/L - 22 19  ALT 0 - 53 U/L - 24 19       8.87, MD 09/05/2020, 2:20 PM  Cc: Edman Circle, MD

## 2020-10-07 NOTE — Patient Instructions (Addendum)
It was great to see you again today as always!   We will change you over from citalopram to effexor (venlafaxine) Take a 1/2 celexa for 3 days, then stop celexa and change over to effexor Take one effexor (75mg ) for 10 days. Assuming you are tolerating well increase to 150 mg at that time.  Let me know how this works for you, I can send rx to [facility] if you are doing well  Please keep me posted If any concerns about self harm please seek help right away

## 2020-10-07 NOTE — Progress Notes (Signed)
[facility] [address], [address] [place], [place] [address] [phone]  Date:  10/10/2020   Name:  Timothy Solis   DOB:  25-Jul-1965   MRN:  [id number]  PCP:  Pearline Cables, MD    Chief Complaint: Depression (Celexa no longer working)   History of Present Illness:  Timothy Solis is a 55 y.o. very pleasant male patient who presents with the following:  Patient seen today for follow-up, concerned about depression Last seen by myself in December- diet controlled diabetes with neuropathy, OSA, obesity, chronic fatigue/encephalomyelitis myalgic encephalomyelitis, dyslipidemia, limited mobility.  He had a positive Cologuard last year -he has seen GI recently, but not yet done colonoscopy- he does have a plan to get this done at the hospital, just waiting to schedule this  He is also thinking of getting gastric bypass which I think is a good idea At her last visit he was taking Celexa 40 mg, and wished to try adding a low-dose of Abilify as he had read it might help for his chronic fatigue.  We tried this medication- however it did not work so he stopped taking it  He is now taking celexa 40 mg only  He has used welbutrin in the past.  No side effects noted, but his depression has gotten a lot worse and that citalopram is no are controlling his symptoms He notes little energy, does not feel like doing much at all Sleeping too much- he may stay in bed until 1pm, then get up for just a couple of hours before he goes back to bed Appetite is overall deceased  No SI He has been a bit anxious as well He has his valium to use prn- he feels "like I want to get out of my skin"  He also did see cardiology last month because he had noted some shortness of breath and palpitations-at that time he was doing well with a low-dose of beta-blocker and aspirin as well as Crestor  Eye exam  He did have an episode of serotonin syndrome about 2  years ago when he was taking multiple serotonergic drugs so we will continue to be cautious  [place] did get a virtual reality headset which has been a big benefit to him.  It allows him to " go out" and had experiences outside of his home which is really enjoying  Wt Readings from Last 3 Encounters:  10/10/20 (!) 425 lb (192.8 kg)  09/05/20 (!) 412 lb 2 oz (186.9 kg)  08/21/20 (!) 421 lb (191 kg)    Lab Results  Component Value Date   HGBA1C 6.7 (H) 07/04/2020    Patient Active Problem List   Diagnosis Date Noted  . Supraventricular tachycardia (HCC) 08/21/2020  . Obesity   . Nephrolithiasis   . IBS (irritable bowel syndrome)   . HTN (hypertension)   . GERD (gastroesophageal reflux disease)   . Chronic fatigue syndrome   . Allergy   . Atypical chest pain 07/16/2020  . Diabetes, polyneuropathy (HCC) 12/14/2018  . OSA on CPAP 12/14/2018  . Paresthesia 12/14/2018  . Diabetes mellitus type 2 in obese (HCC) 07/20/2018  . Costochondritis 09/14/2017  . Dyslipidemia 08/30/2017  . Major depressive disorder with single episode, in remission (HCC) 11/05/2015  . Chronic fatigue 11/05/2015  . Lumbar disc disease with radiculopathy 06/20/2015  . Allergic rhinitis 04/01/2013  . Asthma 04/01/2013  . Irritable bowel syndrome 08/20/2012  .  Morbid obesity (HCC) 08/20/2012  . Depression 08/20/2012  . Gastroesophageal reflux disease 08/20/2012  . Hypertension 08/20/2012    Past Medical History:  Diagnosis Date  . Allergic rhinitis 04/01/2013  . Allergy   . Asthma 04/01/2013   [facility] Dr Tenny Craw Spirometry done 64% predicted mild reduction/ no obstruction   . Atypical chest pain 07/16/2020  . Chronic fatigue 11/05/2015  . Chronic fatigue syndrome   . Costochondritis 09/14/2017  . Depression   . Diabetes mellitus type 2 in obese (HCC) 07/20/2018  . Diabetes, polyneuropathy (HCC) 12/14/2018  . Dyslipidemia 08/30/2017  . Gastroesophageal reflux disease 08/20/2012  .  GERD (gastroesophageal reflux disease)   . HTN (hypertension)   . Hypertension 08/20/2012  . IBS (irritable bowel syndrome)   . IBS (irritable bowel syndrome)   . Irritable bowel syndrome 08/20/2012  . Lumbar disc disease with radiculopathy 06/20/2015   Formatting of this note might be different from the original. L4-5 Disc bulge with left sided pressure on nerve.  No disc rupture  Last Assessment & Plan:  Formatting of this note might be different from the original. Relevant Hx: Course: Daily Update: Today's Plan:  Electronically signed by: Iven Finn II, PA-C 06/20/15 1459  . Major depressive disorder with single episode, in remission (HCC) 11/05/2015  . Morbid obesity (HCC) 08/20/2012  . Nephrolithiasis   . Obesity   . OSA on CPAP 12/14/2018  . Paresthesia 12/14/2018    Past Surgical History:  Procedure Laterality Date  . BACK SURGERY    . CHOLECYSTECTOMY    . KNEE SURGERY      Social History   Tobacco Use  . Smoking status: Former Smoker    Types: Cigars    Quit date: 04/20/2020    Years since quitting: 0.4  . Smokeless tobacco: Never Used  Substance Use Topics  . Alcohol use: Yes    Comment: occasional- rare  . Drug use: Never    Family History  Problem Relation Age of Onset  . Coronary artery disease Other   . Cancer Other   . Heart disease Mother   . Multiple sclerosis Father   . Cancer Brother   . Multiple sclerosis Brother   . Heart disease Maternal Grandmother   . Cancer Maternal Grandfather   . Multiple sclerosis Brother   . Multiple sclerosis Maternal Uncle   . Colon cancer Neg Hx     Allergies  Allergen Reactions  . Ceclor [Cefaclor]     Can't remember what reaction had  . Cyclobenzaprine Other (See Comments)    Serotonin syndrome per pt  . Other Other (See Comments)    Sneezing, watering eyes  . Oxycodone Itching  . Prednisone     Medication list has been reviewed and updated.  Current Outpatient Medications on File Prior to Visit   Medication Sig Dispense Refill  . citalopram (CELEXA) 40 MG tablet Take 1 tablet (40 mg total) by mouth daily. 90 tablet 2  . diazepam (VALIUM) 5 MG tablet TAKE 1 TABLET BY MOUTH EVERY 8 HOURS AS NEEDED FOR ANXIETY 90 tablet 1  . dicyclomine (BENTYL) 20 MG tablet Take 1 tablet by mouth twice a day 120 tablet 2  . lisinopril (ZESTRIL) 20 MG tablet Take 1 tablet (20 mg total) by mouth daily. 90 tablet 2  . metoprolol tartrate (LOPRESSOR) 25 MG tablet Take 1 tablet (25 mg total) by mouth 2 (two) times daily. 180 tablet 3  . nitroGLYCERIN (NITROSTAT) 0.4 MG SL tablet  Place 1 tablet (0.4 mg total) under the tongue every 5 (five) minutes as needed for chest pain. 90 tablet 3  . pantoprazole (PROTONIX) 20 MG tablet Take 1 tablet (20 mg total) by mouth daily. 90 tablet 3  . rosuvastatin (CRESTOR) 20 MG tablet Take 1 tablet (20 mg total) by mouth daily. 90 tablet 3  . Vitamin D, Ergocalciferol, (DRISDOL) 1.25 MG (50000 UNIT) CAPS capsule Take 1 capsule (50,000 Units total) by mouth every 7 (seven) days. 13 capsule 3   No current facility-administered medications on file prior to visit.    Review of Systems:  As per HPI- otherwise negative.   Physical Examination: Vitals:   10/10/20 1332  BP: 134/90  Pulse: 64  Resp: 20  Temp: (!) 97.1 F (36.2 C)  SpO2: 97%   Vitals:   10/10/20 1332  Weight: (!) 425 lb (192.8 kg)  Height: 5\' 10"  (1.778 m)   Body mass index is 60.98 kg/m. Ideal Body Weight: Weight in (lb) to have BMI = 25: 173.9  GEN: no acute distress.  Morbid obesity, otherwise looks well HEENT: Atraumatic, Normocephalic.  Ears and Nose: No external deformity. CV: RRR, No M/G/R. No JVD. No thrill. No extra heart sounds. PULM: CTA B, no wheezes, crackles, rhonchi. No retractions. No resp. distress. No accessory muscle use. EXTR: No c/c/e PSYCH: Normally interactive. Conversant.    Assessment and Plan: Diabetes mellitus type 2 in obese (HCC)  Morbid obesity  (HCC)  Dyslipidemia  OSA on CPAP  Chronic fatigue  Major depressive disorder with single episode, in remission (HCC) - Plan: venlafaxine XR (EFFEXOR XR) 75 MG 24 hr capsule  Here today with concern of worsening depression symptoms. [place] had done relatively well on Celexa 40 for the last few years.  However, recently his depression symptoms are worsening.  We discussed a plan to taper off of Celexa and start Effexor XR.  He will alert me or otherwise seek help right away if any problems with suicidal ideation.  I have asked him to please update me regarding his progress in about 2 weeks  A1c was checked recently and showed good control diabetes Lipids done in July, reasonable Chronic fatigue is part of his depression symptoms This visit occurred during the SARS-CoV-2 public health emergency.  Safety protocols were in place, including screening questions prior to the visit, additional usage of staff PPE, and extensive cleaning of exam room while observing appropriate contact time as indicated for disinfecting solutions.    Signed August, MD

## 2020-10-10 ENCOUNTER — Other Ambulatory Visit (HOSPITAL_COMMUNITY): Payer: Self-pay | Admitting: Family Medicine

## 2020-10-10 ENCOUNTER — Ambulatory Visit (INDEPENDENT_AMBULATORY_CARE_PROVIDER_SITE_OTHER): Payer: Medicare Other | Admitting: Family Medicine

## 2020-10-10 ENCOUNTER — Encounter: Payer: Self-pay | Admitting: Family Medicine

## 2020-10-10 ENCOUNTER — Other Ambulatory Visit: Payer: Self-pay

## 2020-10-10 VITALS — BP 134/90 | HR 64 | Temp 97.1°F | Resp 20 | Ht 70.0 in | Wt >= 6400 oz

## 2020-10-10 DIAGNOSIS — E669 Obesity, unspecified: Secondary | ICD-10-CM | POA: Diagnosis not present

## 2020-10-10 DIAGNOSIS — E1169 Type 2 diabetes mellitus with other specified complication: Secondary | ICD-10-CM

## 2020-10-10 DIAGNOSIS — Z9989 Dependence on other enabling machines and devices: Secondary | ICD-10-CM | POA: Diagnosis not present

## 2020-10-10 DIAGNOSIS — E785 Hyperlipidemia, unspecified: Secondary | ICD-10-CM

## 2020-10-10 DIAGNOSIS — G4733 Obstructive sleep apnea (adult) (pediatric): Secondary | ICD-10-CM

## 2020-10-10 DIAGNOSIS — F325 Major depressive disorder, single episode, in full remission: Secondary | ICD-10-CM

## 2020-10-10 DIAGNOSIS — R5382 Chronic fatigue, unspecified: Secondary | ICD-10-CM

## 2020-10-10 MED ORDER — VENLAFAXINE HCL ER 75 MG PO CP24: 75.0000 mg | ORAL_CAPSULE | Freq: Every day | ORAL | 1 refills | Status: DC

## 2020-10-10 MED FILL — VENLAFAXINE HCL ER 75 MG CA: 75 | 30 days supply | Qty: 60 | Fill #0

## 2020-10-16 ENCOUNTER — Other Ambulatory Visit: Payer: Self-pay | Admitting: Family Medicine

## 2020-10-16 DIAGNOSIS — K589 Irritable bowel syndrome without diarrhea: Secondary | ICD-10-CM

## 2020-10-29 ENCOUNTER — Encounter: Payer: Self-pay | Admitting: Family Medicine

## 2020-11-06 ENCOUNTER — Other Ambulatory Visit: Payer: Self-pay

## 2020-11-06 MED ORDER — VENLAFAXINE HCL ER 75 MG PO CP24: 150.0000 mg | ORAL_CAPSULE | Freq: Every day | ORAL | 1 refills | Status: DC

## 2020-11-21 ENCOUNTER — Encounter: Payer: Self-pay | Admitting: Cardiology

## 2020-11-21 ENCOUNTER — Other Ambulatory Visit: Payer: Self-pay

## 2020-11-21 ENCOUNTER — Ambulatory Visit (INDEPENDENT_AMBULATORY_CARE_PROVIDER_SITE_OTHER): Payer: Medicare Other | Admitting: Cardiology

## 2020-11-21 VITALS — BP 124/82 | HR 76 | Ht 70.0 in | Wt >= 6400 oz

## 2020-11-21 DIAGNOSIS — Z9989 Dependence on other enabling machines and devices: Secondary | ICD-10-CM | POA: Diagnosis not present

## 2020-11-21 DIAGNOSIS — I10 Essential (primary) hypertension: Secondary | ICD-10-CM | POA: Diagnosis not present

## 2020-11-21 DIAGNOSIS — I471 Supraventricular tachycardia: Secondary | ICD-10-CM | POA: Diagnosis not present

## 2020-11-21 DIAGNOSIS — G4733 Obstructive sleep apnea (adult) (pediatric): Secondary | ICD-10-CM | POA: Diagnosis not present

## 2020-11-21 DIAGNOSIS — E1142 Type 2 diabetes mellitus with diabetic polyneuropathy: Secondary | ICD-10-CM | POA: Diagnosis not present

## 2020-11-21 DIAGNOSIS — R0789 Other chest pain: Secondary | ICD-10-CM | POA: Diagnosis not present

## 2020-11-21 NOTE — Progress Notes (Signed)
Cardiology Office Note:    Date:  11/21/2020   ID:  Timothy Solis, DOB 1966-02-17, MRN [id number]  PCP:  Pearline Cables, MD  Cardiologist:  Gypsy Balsam, MD    Referring MD: Pearline Cables, MD   Chief Complaint  Patient presents with  . Follow-up  I am doing better  History of Present Illness:    Timothy Solis is a 55 y.o. male with past medical history significant for morbid obesity, diabetes, essential hypertension, dyslipidemia, was referred to [place] originally because of some palpitations or right or recorded by apple watch irregularity of the heart rate as well as some atypical chest pain.  He did have echocardiogram done which showed preserved left ventricle ejection fraction, he also wore monitor which showed some supraventricular tachycardia.  I did prescribe small dose of beta-blocker which seems to be helping quite nicely with palpitations.  He does not see any more of irregularity, also helps with episode of chest pain.  Chest pain happen in different scenarios sometimes at rest sometimes with exercise.  In 2019 he did have a stress test which was negative for exercise-induced myocardial ischemia, however, he does have multiple risk factors for coronary artery disease.  Comes today to my office for follow-up for doing better.  He lost some weight he changed his antidepressant medication and now feeling better denies have any chest pain tightness squeezing pressure burning chest no palpitations.  He lost few pounds and is very happy about that.  Past Medical History:  Diagnosis Date  . Allergic rhinitis 04/01/2013  . Allergy   . Asthma 04/01/2013   [facility] Dr Tenny Craw Spirometry done 64% predicted mild reduction/ no obstruction   . Atypical chest pain 07/16/2020  . Chronic fatigue 11/05/2015  . Chronic fatigue syndrome   . Costochondritis 09/14/2017  . Depression   . Diabetes mellitus type 2 in obese (HCC) 07/20/2018  . Diabetes, polyneuropathy  (HCC) 12/14/2018  . Dyslipidemia 08/30/2017  . Gastroesophageal reflux disease 08/20/2012  . GERD (gastroesophageal reflux disease)   . HTN (hypertension)   . Hypertension 08/20/2012  . IBS (irritable bowel syndrome)   . IBS (irritable bowel syndrome)   . Irritable bowel syndrome 08/20/2012  . Lumbar disc disease with radiculopathy 06/20/2015   Formatting of this note might be different from the original. L4-5 Disc bulge with left sided pressure on nerve.  No disc rupture  Last Assessment & Plan:  Formatting of this note might be different from the original. Relevant Hx: Course: Daily Update: Today's Plan:  Electronically signed by: Iven Finn II, PA-C 06/20/15 1459  . Major depressive disorder with single episode, in remission (HCC) 11/05/2015  . Morbid obesity (HCC) 08/20/2012  . Nephrolithiasis   . Obesity   . OSA on CPAP 12/14/2018  . Paresthesia 12/14/2018  . Supraventricular tachycardia (HCC) 08/21/2020    Past Surgical History:  Procedure Laterality Date  . BACK SURGERY    . CHOLECYSTECTOMY    . KNEE SURGERY      Current Medications: Current Meds  Medication Sig  . citalopram (CELEXA) 40 MG tablet Take 1 tablet (40 mg total) by mouth daily.  . diazepam (VALIUM) 5 MG tablet TAKE 1 TABLET BY MOUTH EVERY 8 HOURS AS NEEDED FOR ANXIETY (Patient taking differently: Take 5 mg by mouth every 8 (eight) hours as needed for anxiety. TAKE 1 TABLET BY MOUTH EVERY 8 HOURS AS NEEDED FOR ANXIETY)  . dicyclomine (BENTYL) 20 MG tablet Take 1  tablet by mouth twice a day (Patient taking differently: Take 20 mg by mouth in the morning and at bedtime.)  . lisinopril (ZESTRIL) 20 MG tablet Take 1 tablet (20 mg total) by mouth daily.  . metoprolol tartrate (LOPRESSOR) 25 MG tablet Take 1 tablet (25 mg total) by mouth 2 (two) times daily.  . nitroGLYCERIN (NITROSTAT) 0.4 MG SL tablet Place 1 tablet (0.4 mg total) under the tongue every 5 (five) minutes as needed for chest pain.  . pantoprazole  (PROTONIX) 20 MG tablet Take 1 tablet (20 mg total) by mouth daily.  . rosuvastatin (CRESTOR) 20 MG tablet Take 1 tablet (20 mg total) by mouth daily.  Marland Kitchen venlafaxine XR (EFFEXOR-XR) 75 MG 24 hr capsule Take 2 capsules (150 mg total) by mouth daily with breakfast.  . Vitamin D, Ergocalciferol, (DRISDOL) 1.25 MG (50000 UNIT) CAPS capsule Take 1 capsule (50,000 Units total) by mouth every 7 (seven) days.     Allergies:   Ceclor [cefaclor], Cyclobenzaprine, Other, Oxycodone, and Prednisone   Social History   Socioeconomic History  . Marital status: Divorced    Spouse name: Not on file  . Number of children: 0  . Years of education: 65  . Highest education level: Not on file  Occupational History  . Occupation: Disability  Tobacco Use  . Smoking status: Former Smoker    Types: Cigars    Quit date: 04/20/2020    Years since quitting: 0.5  . Smokeless tobacco: Never Used  Substance and Sexual Activity  . Alcohol use: Yes    Comment: occasional- rare  . Drug use: Never  . Sexual activity: Not Currently  Other Topics Concern  . Not on file  Social History Narrative   Right handed    Caffeine use: Coffee/tea/soda daily   Lives alone   Social Determinants of Health   Financial Resource Strain: Not on file  Food Insecurity: Not on file  Transportation Needs: Not on file  Physical Activity: Not on file  Stress: Not on file  Social Connections: Not on file     Family History: The patient's family history includes Cancer in his brother, maternal grandfather, and another family member; Coronary artery disease in an other family member; Heart disease in his maternal grandmother and mother; Multiple sclerosis in his brother, brother, father, and maternal uncle. There is no history of Colon cancer. ROS:   Please see the history of present illness.    All 14 point review of systems negative except as described per history of present illness  EKGs/Labs/Other Studies Reviewed:       Recent Labs: 02/01/2020: ALT 24; Hemoglobin 13.6; Platelets 263.0 07/04/2020: BUN 18; Creatinine, Ser 1.15; Potassium 4.4; Sodium 140  Recent Lipid Panel    Component Value Date/Time   CHOL 130 02/01/2020 1433   TRIG 178.0 (H) 02/01/2020 1433   HDL 41.50 02/01/2020 1433   CHOLHDL 3 02/01/2020 1433   VLDL 35.6 02/01/2020 1433   LDLCALC 53 02/01/2020 1433   LDLDIRECT 136.0 05/19/2019 1341    Physical Exam:    VS:  BP 124/82 (BP Location: Right Arm, Patient Position: Sitting)   Pulse 76   Ht 5\' 10"  (1.778 m)   Wt (!) 416 lb (188.7 kg)   SpO2 94%   BMI 59.69 kg/m     Wt Readings from Last 3 Encounters:  11/21/20 (!) 416 lb (188.7 kg)  10/10/20 (!) 425 lb (192.8 kg)  09/05/20 (!) 412 lb 2 oz (186.9 kg)  GEN:  Well nourished, well developed in no acute distress HEENT: Normal NECK: No JVD; No carotid bruits LYMPHATICS: No lymphadenopathy CARDIAC: RRR, no murmurs, no rubs, no gallops RESPIRATORY:  Clear to auscultation without rales, wheezing or rhonchi  ABDOMEN: Soft, non-tender, non-distended MUSCULOSKELETAL:  No edema; No deformity  SKIN: Warm and dry LOWER EXTREMITIES: no swelling NEUROLOGIC:  Alert and oriented x 3 PSYCHIATRIC:  Normal affect   ASSESSMENT:    1. Supraventricular tachycardia (HCC)   2. Primary hypertension   3. OSA on CPAP   4. Diabetic polyneuropathy associated with type 2 diabetes mellitus (HCC)   5. Atypical chest pain    PLAN:    In order of problems listed above:  1. Supraventricular tachycardia controlled with medications no arrhythmia no palpitations we will continue monitoring. 2. Essential hypertension, blood pressure perfectly at target. 3. Obstructive sleep apnea he is using CPAP mask on the regular basis which we will continue. 4. Diabetes I did review K PN which show me last hemoglobin A1c of 6.7 from 07/04/2020. 5. Atypical chest pain denies having any. 6. Morbid obesity and he is talking about potentially doing gastric  bypass surgery.  He started losing weight he is already in a program working with dietitian.   Medication Adjustments/Labs and Tests Ordered: Current medicines are reviewed at length with the patient today.  Concerns regarding medicines are outlined above.  No orders of the defined types were placed in this encounter.  Medication changes: No orders of the defined types were placed in this encounter.   Signed, Georgeanna Lea, MD, [facility] 11/21/2020 3:53 PM    [facility]

## 2020-11-21 NOTE — Patient Instructions (Signed)

## 2020-12-25 ENCOUNTER — Other Ambulatory Visit: Payer: Self-pay | Admitting: Family Medicine

## 2020-12-25 DIAGNOSIS — I10 Essential (primary) hypertension: Secondary | ICD-10-CM

## 2021-02-14 ENCOUNTER — Other Ambulatory Visit: Payer: Self-pay | Admitting: *Deleted

## 2021-02-14 DIAGNOSIS — K589 Irritable bowel syndrome without diarrhea: Secondary | ICD-10-CM

## 2021-02-14 MED ORDER — DICYCLOMINE HCL 20 MG PO TABS: 20.0000 mg | ORAL_TABLET | Freq: Two times a day (BID) | ORAL | 1 refills | Status: DC

## 2021-03-27 DIAGNOSIS — S8991XA Unspecified injury of right lower leg, initial encounter: Secondary | ICD-10-CM | POA: Diagnosis not present

## 2021-03-27 DIAGNOSIS — M25561 Pain in right knee: Secondary | ICD-10-CM | POA: Diagnosis not present

## 2021-03-29 ENCOUNTER — Other Ambulatory Visit: Payer: Self-pay | Admitting: Family Medicine

## 2021-03-30 NOTE — Patient Instructions (Addendum)
It was nice to see you again today, take care!  You might check out the Psychology today website for psychologist options   [facility] [address]  [phone]  [facility] [address] [place]  [phone]  [facility]  Address: [address] [place], [place] [address] Phone: [phone]  [facility] [address] [phone]

## 2021-03-30 NOTE — Progress Notes (Addendum)
[facility] [address] [place], [place] [address] [phone] [phone]  Date:  04/01/2021   Name:  Timothy MoodCorwin E Solis   DOB:  01-Apr-1966   MRN:  [id number]  PCP:  Pearline Cablesopland, Jacquis Paxton C, MD    Chief Complaint: Mental Health Problem (Discuss mental health )   History of Present Illness:  Timothy MoodCorwin E Solis is a 55 y.o. very pleasant male patient who presents with the following:  Patient seen today for a follow-up visit- diet controlled diabetes with neuropathy, OSA, obesity, chronic fatigue/encephalomyelitis myalgic encephalomyelitis, dyslipidemia, limited mobility Last seen by myself in March of this year  At her last visit he was taking Celexa 40 mg, but noted that he was to having depression symptoms.  He was sleeping too much, did not have much energy At that time we tapered him off Celexa and started on venlafaxine He is on 150 mg right now- he feels like this is working pretty well for him   He does have Valium which he uses on very rare occasion He is interested in starting some counseling He lost 2 of his pet cats (who are very important to him) this year which has been really hard.  He is working with 2 of his friends through some hard times  He has done some counseling previously-   He was seen by cardiology in May to follow-up on palpitations- SVT controlled with metoprolol  He injured his right knee again and is being seen by ortho tomorrow Re-injury seemed to occur about a week ago He just moved his leg while seated- there was a loud crack and then severe pain.  He is wearing a brace again   Lab Results  Component Value Date   HGBA1C 6.7 (H) 07/04/2020   Eye exam-  Colonoscopy-he had a positive Cologuard and had needed to arrange follow-up Recommended new COVID-19 booster Foot exam due Flu vaccine- will give today  Shingrix  Patient Active Problem List   Diagnosis Date Noted   Supraventricular tachycardia  (HCC) 08/21/2020   Obesity    Nephrolithiasis    IBS (irritable bowel syndrome)    HTN (hypertension)    GERD (gastroesophageal reflux disease)    Chronic fatigue syndrome    Allergy    Atypical chest pain 07/16/2020   Diabetes, polyneuropathy (HCC) 12/14/2018   OSA on CPAP 12/14/2018   Paresthesia 12/14/2018   Diabetes mellitus type 2 in obese (HCC) 07/20/2018   Costochondritis 09/14/2017   Dyslipidemia 08/30/2017   Major depressive disorder with single episode, in remission (HCC) 11/05/2015   Chronic fatigue 11/05/2015   Allergic rhinitis 04/01/2013   Asthma 04/01/2013   Irritable bowel syndrome 08/20/2012   Morbid obesity (HCC) 08/20/2012   Depression 08/20/2012   Gastroesophageal reflux disease 08/20/2012   Hypertension 08/20/2012    Past Medical History:  Diagnosis Date   Allergic rhinitis 04/01/2013   Allergy    Asthma 04/01/2013   [facility] Tenny Crawoss Spirometry done 64% predicted mild reduction/ no obstruction    Atypical chest pain 07/16/2020   Chronic fatigue 11/05/2015   Chronic fatigue syndrome    Costochondritis 09/14/2017   Depression    Diabetes mellitus type 2 in obese (HCC) 07/20/2018   Diabetes, polyneuropathy (HCC) 12/14/2018   Dyslipidemia 08/30/2017   Gastroesophageal reflux disease 08/20/2012   GERD (gastroesophageal reflux disease)    HTN (hypertension)    Hypertension 08/20/2012   IBS (irritable bowel syndrome)  IBS (irritable bowel syndrome)    Irritable bowel syndrome 08/20/2012   Lumbar disc disease with radiculopathy 06/20/2015   Formatting of this note might be different from the original. L4-5 Disc bulge with left sided pressure on nerve.  No disc rupture  Last Assessment & Plan:  Formatting of this note might be different from the original. Relevant Hx: Course: Daily Update: Today's Plan:  Electronically signed by: Iven Finn II, PA-C 06/20/15 1459   Major depressive disorder with single episode, in remission (HCC)  11/05/2015   Morbid obesity (HCC) 08/20/2012   Nephrolithiasis    Obesity    OSA on CPAP 12/14/2018   Paresthesia 12/14/2018   Supraventricular tachycardia (HCC) 08/21/2020    Past Surgical History:  Procedure Laterality Date   BACK SURGERY     CHOLECYSTECTOMY     KNEE SURGERY      Social History   Tobacco Use   Smoking status: Former    Types: Cigars    Quit date: 04/20/2020    Years since quitting: 0.9   Smokeless tobacco: Never  Substance Use Topics   Alcohol use: Yes    Comment: occasional- rare   Drug use: Never    Family History  Problem Relation Age of Onset   Coronary artery disease Other    Cancer Other    Heart disease Mother    Multiple sclerosis Father    Cancer Brother    Multiple sclerosis Brother    Heart disease Maternal Grandmother    Cancer Maternal Grandfather    Multiple sclerosis Brother    Multiple sclerosis Maternal Uncle    Colon cancer Neg Hx     Allergies  Allergen Reactions   Ceclor [Cefaclor]     Can't remember what reaction had   Cyclobenzaprine Other (See Comments)    Serotonin syndrome per pt   Other Other (See Comments)    Sneezing, watering eyes   Oxycodone Itching   Prednisone     Medication list has been reviewed and updated.  Current Outpatient Medications on File Prior to Visit  Medication Sig Dispense Refill   diazepam (VALIUM) 5 MG tablet TAKE 1 TABLET BY MOUTH EVERY 8 HOURS AS NEEDED FOR ANXIETY (Patient taking differently: Take 5 mg by mouth every 8 (eight) hours as needed for anxiety. TAKE 1 TABLET BY MOUTH EVERY 8 HOURS AS NEEDED FOR ANXIETY) 90 tablet 1   dicyclomine (BENTYL) 20 MG tablet Take 1 tablet (20 mg total) by mouth 2 (two) times daily. 180 tablet 1   lisinopril (ZESTRIL) 20 MG tablet Take 1 tablet (20 mg) by mouth daily. 90 tablet 1   pantoprazole (PROTONIX) 20 MG tablet Take 1 tablet (20 mg total) by mouth daily. 90 tablet 3   rosuvastatin (CRESTOR) 20 MG tablet Take 1 tablet (20 mg total) by mouth daily.  90 tablet 3   venlafaxine XR (EFFEXOR-XR) 75 MG 24 hr capsule Take 2 capsules (150 mg total) by mouth daily with breakfast. 180 capsule 2   Vitamin D, Ergocalciferol, (DRISDOL) 1.25 MG (50000 UNIT) CAPS capsule Take 1 capsule (50,000 Units total) by mouth every 7 (seven) days. 13 capsule 3   metoprolol tartrate (LOPRESSOR) 25 MG tablet Take 1 tablet (25 mg total) by mouth 2 (two) times daily. 180 tablet 3   nitroGLYCERIN (NITROSTAT) 0.4 MG SL tablet Place 1 tablet (0.4 mg total) under the tongue every 5 (five) minutes as needed for chest pain. 90 tablet 3   No current facility-administered medications on  file prior to visit.    Review of Systems:  As per HPI- otherwise negative.   Physical Examination: Vitals:   04/01/21 1522  BP: 126/88  Pulse: 91  Resp: 20  SpO2: 96%   Vitals:   04/01/21 1522  Weight: (!) 416 lb (188.7 kg)  Height: 5\' 10"  (1.778 m)   Body mass index is 59.69 kg/m. Ideal Body Weight: Weight in (lb) to have BMI = 25: 173.9  GEN: no acute distress.  Obese, looks well  Wearing a right knee immobilizer  HEENT: Atraumatic, Normocephalic.  Ears and Nose: No external deformity. CV: RRR, No M/G/R. No JVD. No thrill. No extra heart sounds. PULM: CTA B, no wheezes, crackles, rhonchi. No retractions. No resp. distress. No accessory muscle use. EXTR: No c/c/e PSYCH: Normally interactive. Conversant.    Assessment and Plan: Diabetes mellitus type 2 in obese (HCC) - Plan: Comprehensive metabolic panel, Hemoglobin A1c  Morbid obesity (HCC)  Dyslipidemia - Plan: Lipid panel  Major depressive disorder with single episode, in remission (HCC)  Chronic fatigue - Plan: CBC, TSH, VITAMIN D 25 Hydroxy (Vit-D Deficiency, Fractures), Vitamin B12  OSA on CPAP  Vitamin D deficiency - Plan: VITAMIN D 25 Hydroxy (Vit-D Deficiency, Fractures)  B12 deficiency - Plan: Vitamin B12  We discussed seeing a counselor today-  I gave some potential contacts He is doing ok on  effexor 150 daily- denies any suicidal ideation Labs pending as above Flu shot given    This visit occurred during the SARS-CoV-2 public health emergency.  Safety protocols were in place, including screening questions prior to the visit, additional usage of staff PPE, and extensive cleaning of exam room while observing appropriate contact time as indicated for disinfecting solutions.   Signed , MD  Received labs as below, message to patient Results for orders placed or performed in visit on 04/01/21  CBC  Result Value Ref Range   WBC 8.3 4.0 - 10.5 K/uL   RBC 4.94 4.22 - 5.81 Mil/uL   Platelets 252.0 150.0 - 400.0 K/uL   Hemoglobin 14.2 13.0 - 17.0 g/dL   HCT 06/01/21 89.3 - 81.0 %   MCV 85.6 78.0 - 100.0 fl   MCHC 33.6 30.0 - 36.0 g/dL   RDW 17.5 10.2 - 58.5 %  Comprehensive metabolic panel  Result Value Ref Range   Sodium 141 135 - 145 mEq/L   Potassium 4.1 3.5 - 5.1 mEq/L   Chloride 103 96 - 112 mEq/L   CO2 27 19 - 32 mEq/L   Glucose, Bld 125 (H) 70 - 99 mg/dL   BUN 15 6 - 23 mg/dL   Creatinine, Ser 27.7 0.40 - 1.50 mg/dL   Total Bilirubin 0.3 0.2 - 1.2 mg/dL   Alkaline Phosphatase 86 39 - 117 U/L   AST 23 0 - 37 U/L   ALT 22 0 - 53 U/L   Total Protein 7.0 6.0 - 8.3 g/dL   Albumin 4.0 3.5 - 5.2 g/dL   GFR 8.24 23.53 mL/min   Calcium 8.8 8.4 - 10.5 mg/dL  Hemoglobin >61.44  Result Value Ref Range   Hgb A1c MFr Bld 6.7 (H) 4.6 - 6.5 %  Lipid panel  Result Value Ref Range   Cholesterol 154 0 - 200 mg/dL   Triglycerides R1V (H) 0.0 - 149.0 mg/dL   HDL 400.8 67.61 mg/dL   VLDL >95.09 (H) 0.0 - 32.6 mg/dL   Total CHOL/HDL Ratio 4    NonHDL 111.67   TSH  Result Value Ref Range   TSH 1.22 0.35 - 5.50 uIU/mL  VITAMIN D 25 Hydroxy (Vit-D Deficiency, Fractures)  Result Value Ref Range   VITD 21.16 (L) 30.00 - 100.00 ng/mL  Vitamin B12  Result Value Ref Range   Vitamin B-12 149 (L) 211 - 911 pg/mL  LDL cholesterol, direct  Result Value Ref Range   Direct  LDL 81.0 mg/dL   The 50-TUUE ASCVD risk score (Arnett DK, et al., 2019) is: 8.5%   Values used to calculate the score:     Age: 32 years     Sex: Male     Is Non-Hispanic African American: No     Diabetic: Yes     Tobacco smoker: No     Systolic Blood Pressure: 126 mmHg     Is BP treated: Yes     HDL Cholesterol: 42.1 mg/dL     Total Cholesterol: 154 mg/dL

## 2021-04-01 ENCOUNTER — Encounter: Payer: Self-pay | Admitting: Family Medicine

## 2021-04-01 ENCOUNTER — Ambulatory Visit (INDEPENDENT_AMBULATORY_CARE_PROVIDER_SITE_OTHER): Payer: Medicare Other | Admitting: Family Medicine

## 2021-04-01 ENCOUNTER — Other Ambulatory Visit: Payer: Self-pay

## 2021-04-01 VITALS — BP 126/88 | HR 91 | Resp 20 | Ht 70.0 in | Wt >= 6400 oz

## 2021-04-01 DIAGNOSIS — R5382 Chronic fatigue, unspecified: Secondary | ICD-10-CM

## 2021-04-01 DIAGNOSIS — E669 Obesity, unspecified: Secondary | ICD-10-CM

## 2021-04-01 DIAGNOSIS — G4733 Obstructive sleep apnea (adult) (pediatric): Secondary | ICD-10-CM | POA: Diagnosis not present

## 2021-04-01 DIAGNOSIS — E785 Hyperlipidemia, unspecified: Secondary | ICD-10-CM

## 2021-04-01 DIAGNOSIS — E538 Deficiency of other specified B group vitamins: Secondary | ICD-10-CM | POA: Diagnosis not present

## 2021-04-01 DIAGNOSIS — F325 Major depressive disorder, single episode, in full remission: Secondary | ICD-10-CM | POA: Diagnosis not present

## 2021-04-01 DIAGNOSIS — E559 Vitamin D deficiency, unspecified: Secondary | ICD-10-CM

## 2021-04-01 DIAGNOSIS — E1169 Type 2 diabetes mellitus with other specified complication: Secondary | ICD-10-CM | POA: Diagnosis not present

## 2021-04-01 DIAGNOSIS — Z9989 Dependence on other enabling machines and devices: Secondary | ICD-10-CM | POA: Diagnosis not present

## 2021-04-02 DIAGNOSIS — M659 Synovitis and tenosynovitis, unspecified: Secondary | ICD-10-CM | POA: Insufficient documentation

## 2021-04-02 DIAGNOSIS — M65961 Unspecified synovitis and tenosynovitis, right lower leg: Secondary | ICD-10-CM

## 2021-04-02 DIAGNOSIS — E538 Deficiency of other specified B group vitamins: Secondary | ICD-10-CM | POA: Insufficient documentation

## 2021-04-02 HISTORY — DX: Unspecified synovitis and tenosynovitis, right lower leg: M65.961

## 2021-04-02 LAB — CBC
HCT: 42.2 % (ref 39.0–52.0)
Hemoglobin: 14.2 g/dL (ref 13.0–17.0)
MCHC: 33.6 g/dL (ref 30.0–36.0)
MCV: 85.6 fl (ref 78.0–100.0)
Platelets: 252 10*3/uL (ref 150.0–400.0)
RBC: 4.94 Mil/uL (ref 4.22–5.81)
RDW: 13.8 % (ref 11.5–15.5)
WBC: 8.3 10*3/uL (ref 4.0–10.5)

## 2021-04-02 LAB — COMPREHENSIVE METABOLIC PANEL
ALT: 22 U/L (ref 0–53)
AST: 23 U/L (ref 0–37)
Albumin: 4 g/dL (ref 3.5–5.2)
Alkaline Phosphatase: 86 U/L (ref 39–117)
BUN: 15 mg/dL (ref 6–23)
CO2: 27 mEq/L (ref 19–32)
Calcium: 8.8 mg/dL (ref 8.4–10.5)
Chloride: 103 mEq/L (ref 96–112)
Creatinine, Ser: 1.15 mg/dL (ref 0.40–1.50)
GFR: 72.85 mL/min (ref >60.00)
Glucose, Bld: 125 mg/dL — ABNORMAL HIGH (ref 70–99)
Potassium: 4.1 mEq/L (ref 3.5–5.1)
Sodium: 141 mEq/L (ref 135–145)
Total Bilirubin: 0.3 mg/dL (ref 0.2–1.2)
Total Protein: 7 g/dL (ref 6.0–8.3)

## 2021-04-02 LAB — LIPID PANEL
Cholesterol: 154 mg/dL (ref 0–200)
HDL: 42.1 mg/dL (ref >39.00)
NonHDL: 111.67
Total CHOL/HDL Ratio: 4
Triglycerides: 250 mg/dL — ABNORMAL HIGH (ref 0.0–149.0)
VLDL: 50 mg/dL — ABNORMAL HIGH (ref 0.0–40.0)

## 2021-04-02 LAB — LDL CHOLESTEROL, DIRECT: Direct LDL: 81 mg/dL

## 2021-04-02 LAB — VITAMIN D 25 HYDROXY (VIT D DEFICIENCY, FRACTURES): VITD: 21.16 ng/mL — ABNORMAL LOW (ref 30.00–100.00)

## 2021-04-02 LAB — TSH: TSH: 1.22 u[IU]/mL (ref 0.35–5.50)

## 2021-04-02 LAB — VITAMIN B12: Vitamin B-12: 149 pg/mL — ABNORMAL LOW (ref 211–911)

## 2021-04-02 LAB — HEMOGLOBIN A1C: Hgb A1c MFr Bld: 6.7 % — ABNORMAL HIGH (ref 4.6–6.5)

## 2021-04-02 MED ORDER — VITAMIN D (ERGOCALCIFEROL) 1.25 MG (50000 UNIT) PO CAPS: 50000.0000 [IU] | ORAL_CAPSULE | ORAL | 3 refills | Status: DC

## 2021-04-02 NOTE — Addendum Note (Signed)
Addended by: Abbe Amsterdam C on: 04/02/2021 07:25 PM   Modules accepted: Orders

## 2021-04-15 ENCOUNTER — Telehealth: Payer: Self-pay | Admitting: Family Medicine

## 2021-04-15 NOTE — Telephone Encounter (Signed)
Left message for patient to call back and schedule Medicare Annual Wellness Visit (AWV) in office.  ° °If not able to come in office, please offer to do virtually or by telephone.  Left office number and my jabber #[phone]. ° °Last AWV:11/05/2017 ° °Please schedule at anytime with Nurse Health Advisor. °  °

## 2021-04-24 ENCOUNTER — Telehealth: Payer: Self-pay | Admitting: Gastroenterology

## 2021-04-24 NOTE — Telephone Encounter (Signed)
Timothy Solis, Please see my last note  Needs EGD/colonoscopy after 2-day prep at [facility] (BMI>50) with me or any provider (first available) please  RG

## 2021-04-26 ENCOUNTER — Other Ambulatory Visit: Payer: Self-pay

## 2021-04-26 NOTE — Telephone Encounter (Signed)
Pt has been scheduled for an EGD/ Colonoscopy at [facility] on 06/11/2021 at 8:45 with Dr. Chales Abrahams Case # [id number]. Previsit appt scheduled for 05/17/2021 @ 2:00. Pt to arrive at 1:45.  Left message for pt to call Back

## 2021-04-29 NOTE — Telephone Encounter (Signed)
Left message to call back X 2 

## 2021-04-30 NOTE — Telephone Encounter (Signed)
Pt was notified of the EGD/ Colonoscopy scheduled for 06/11/2021 @ 8:45 at [facility] with Dr. Chales Abrahams. Pt was also notified of the previsit appointment that was scheduled on 05/17/2021 @ 2:00 arrive by 1:45. Pt verbalized understanding with all questions answered.

## 2021-05-07 DIAGNOSIS — M25561 Pain in right knee: Secondary | ICD-10-CM | POA: Diagnosis not present

## 2021-05-17 ENCOUNTER — Ambulatory Visit (AMBULATORY_SURGERY_CENTER): Payer: Self-pay | Admitting: *Deleted

## 2021-05-17 ENCOUNTER — Other Ambulatory Visit: Payer: Self-pay

## 2021-05-17 VITALS — Ht 70.0 in | Wt >= 6400 oz

## 2021-05-17 DIAGNOSIS — K219 Gastro-esophageal reflux disease without esophagitis: Secondary | ICD-10-CM

## 2021-05-17 DIAGNOSIS — K580 Irritable bowel syndrome with diarrhea: Secondary | ICD-10-CM

## 2021-05-17 DIAGNOSIS — R195 Other fecal abnormalities: Secondary | ICD-10-CM

## 2021-05-17 NOTE — Progress Notes (Signed)
Pt hasn't taken nitroglycerin in "a long time." No trouble with anesthesia, denies being told they were difficult to intubate.   No egg or soy allergy  No home oxygen use   No medications for weight loss taken  emmi information given  2 day/double Miralax prep given per DO  Pt informed that we do not do prior authorizations for prep

## 2021-05-31 ENCOUNTER — Encounter (HOSPITAL_COMMUNITY): Payer: Self-pay | Admitting: Gastroenterology

## 2021-05-31 ENCOUNTER — Other Ambulatory Visit: Payer: Self-pay

## 2021-06-10 ENCOUNTER — Encounter (HOSPITAL_COMMUNITY): Payer: Self-pay | Admitting: Certified Registered"

## 2021-06-11 ENCOUNTER — Ambulatory Visit (HOSPITAL_COMMUNITY): Admission: RE | Admit: 2021-06-11 | Payer: Medicare Other | Source: Home / Self Care | Admitting: Gastroenterology

## 2021-06-11 SURGERY — ESOPHAGOGASTRODUODENOSCOPY (EGD) WITH PROPOFOL
Anesthesia: Monitor Anesthesia Care

## 2021-06-17 ENCOUNTER — Telehealth: Payer: Self-pay

## 2021-06-17 NOTE — Telephone Encounter (Signed)
Left message for pt to call back  °

## 2021-06-17 NOTE — Telephone Encounter (Signed)
-----   Message from Lynann Bologna, MD sent at 06/17/2021  8:38 AM EST ----- I see he was scheduled for EGD/colon at [facility] Did he NO SHOW? Pl let me know and document  RG ----- Message ----- From: Pearline Cables, MD Sent: 04/01/2021   3:44 PM EST To: Lynann Bologna, MD  Hi- this patient had a positive cologuard and was supposed to get in for a colon.  Can you guys please follow-up with him?  Thank you so much!  JC

## 2021-06-18 NOTE — Telephone Encounter (Signed)
Left message for pt to call back  °

## 2021-06-18 NOTE — Telephone Encounter (Signed)
-----   Message from Lynann Bologna, MD sent at 06/17/2021  8:38 AM EST ----- I see he was scheduled for EGD/colon at [facility] Did he NO SHOW? Pl let me know and document  RG ----- Message ----- From: Pearline Cables, MD Sent: 04/01/2021   3:44 PM EST To: Lynann Bologna, MD  Hi- this patient had a positive cologuard and was supposed to get in for a colon.  Can you guys please follow-up with him?  Thank you so much!  JC

## 2021-06-20 NOTE — Telephone Encounter (Signed)
Patient returned call. Best contact number [phone]

## 2021-06-20 NOTE — Telephone Encounter (Signed)
Left message to call back  

## 2021-06-21 NOTE — Telephone Encounter (Signed)
Left message for pt to call back  °

## 2021-06-24 NOTE — Telephone Encounter (Signed)
Pt stated that his transportation got sick the day before his procedure and was admitted to the hospital. Pt stated that he did call the day before and canceled the procedure. Pt stated that the number he called was [phone].  Pt states that he will call back in January to reschedule.  Pt verbalized understanding with all questions answered.

## 2021-06-25 NOTE — Telephone Encounter (Signed)
No problems. Timothy Solis, can you please call him in Jan 2023 for procedure in feb RG

## 2021-07-24 NOTE — Telephone Encounter (Signed)
Left message to confirm date for  EGD/Colon with pt.

## 2021-07-25 NOTE — Telephone Encounter (Signed)
Left message for pt to call back  to confirm date for  EGD/Colon with pt.

## 2021-07-26 NOTE — Telephone Encounter (Signed)
Left message for pt to call back  to discuss date for  EGD/Colon with pt.

## 2021-07-29 NOTE — Telephone Encounter (Signed)
Left Message for pt to call back  °

## 2021-07-31 ENCOUNTER — Other Ambulatory Visit: Payer: Self-pay

## 2021-07-31 ENCOUNTER — Other Ambulatory Visit: Payer: Self-pay | Admitting: Family Medicine

## 2021-07-31 DIAGNOSIS — I10 Essential (primary) hypertension: Secondary | ICD-10-CM

## 2021-07-31 DIAGNOSIS — R195 Other fecal abnormalities: Secondary | ICD-10-CM

## 2021-07-31 DIAGNOSIS — K219 Gastro-esophageal reflux disease without esophagitis: Secondary | ICD-10-CM

## 2021-07-31 DIAGNOSIS — K589 Irritable bowel syndrome without diarrhea: Secondary | ICD-10-CM

## 2021-07-31 DIAGNOSIS — E785 Hyperlipidemia, unspecified: Secondary | ICD-10-CM

## 2021-07-31 MED ORDER — ROSUVASTATIN CALCIUM 20 MG PO TABS: 20.0000 mg | ORAL_TABLET | Freq: Every day | ORAL | 1 refills | Status: DC

## 2021-07-31 MED ORDER — METOPROLOL TARTRATE 25 MG PO TABS: 25.0000 mg | ORAL_TABLET | Freq: Two times a day (BID) | ORAL | 1 refills | Status: DC

## 2021-07-31 MED ORDER — VENLAFAXINE HCL ER 75 MG PO CP24
150.0000 mg | ORAL_CAPSULE | Freq: Every day | ORAL | 1 refills | Status: DC
Start: 2021-07-31 — End: 2022-02-17

## 2021-07-31 MED ORDER — LISINOPRIL 20 MG PO TABS: ORAL_TABLET | ORAL | 3 refills | Status: DC

## 2021-07-31 MED ORDER — DICYCLOMINE HCL 20 MG PO TABS: 20.0000 mg | ORAL_TABLET | Freq: Two times a day (BID) | ORAL | 1 refills | Status: DC

## 2021-07-31 MED ORDER — LISINOPRIL 20 MG PO TABS: ORAL_TABLET | ORAL | 1 refills | Status: DC

## 2021-07-31 NOTE — Telephone Encounter (Signed)
Left message for pt to call back  °

## 2021-07-31 NOTE — Telephone Encounter (Signed)
I have attempted to call pt and there was no answer so I left a message to call back.   Okay to refill these medication some are controlled.   Pt has not been seen since 04/01/21. No follow up appointment scheduled yet.

## 2021-07-31 NOTE — Telephone Encounter (Signed)
Patient returned your call, please advise. 

## 2021-07-31 NOTE — Telephone Encounter (Signed)
Pt scheduled for EGD/ Colonoscopy on 10/07/2021 at 10:30 with Dr. Chales Abrahams at [facility]:  Case Number [id number]; Pt made aware Pt scheduled for a previsit: 09/04/2021 at 10:00. Telephone Visit. Pt made aware Pt verbalized understanding with all questions answered.

## 2021-07-31 NOTE — Telephone Encounter (Signed)
Patient states he changed insurance and needs all of these medications to be sent to:   [facility]  [address], [place], [place] [address] Phone: [phone]  1.Vitamin D, Ergocalciferol, (DRISDOL) 1.25 MG 2.venlafaxine XR (EFFEXOR-XR) 75 MG 24 hr capsule 3.dicyclomine (BENTYL) 20 MG tablet 4.lisinopril (ZESTRIL) 20 MG tablet 5.metoprolol tartrate (LOPRESSOR) 25 MG tablet 6.pantoprazole (PROTONIX) 20 MG tablet  7.rosuvastatin (CRESTOR) 20 MG tablet

## 2021-08-05 ENCOUNTER — Other Ambulatory Visit: Payer: Self-pay | Admitting: Family Medicine

## 2021-08-05 ENCOUNTER — Ambulatory Visit: Payer: Medicare Other | Admitting: Cardiology

## 2021-08-14 DIAGNOSIS — M1990 Unspecified osteoarthritis, unspecified site: Secondary | ICD-10-CM | POA: Insufficient documentation

## 2021-08-19 ENCOUNTER — Ambulatory Visit: Payer: Medicare (Managed Care) | Attending: Internal Medicine

## 2021-08-19 DIAGNOSIS — Z23 Encounter for immunization: Secondary | ICD-10-CM

## 2021-08-19 NOTE — Progress Notes (Signed)
° °  Covid-19 Vaccination Clinic  Name:  Timothy Solis    MRN: [id number] DOB: [birth year]  08/19/2021  Mr. Timothy Solis was observed post Covid-19 immunization for 15 minutes without incident. He was provided with Vaccine Information Sheet and instruction to access the V-Safe system.   Mr. Timothy Solis was instructed to call 911 with any severe reactions post vaccine: Difficulty breathing  Swelling of face and throat  A fast heartbeat  A bad rash all over body  Dizziness and weakness   Immunizations Administered     Name Date Dose VIS Date Route   Pfizer Covid-19 Vaccine Bivalent Booster 08/19/2021  1:15 PM 0.3 mL 03/20/2021 Intramuscular   Manufacturer: [employer], [employer]   Lot: [id number]   NDC: [phone]

## 2021-08-20 ENCOUNTER — Encounter: Payer: Self-pay | Admitting: Cardiology

## 2021-08-20 ENCOUNTER — Other Ambulatory Visit: Payer: Self-pay

## 2021-08-20 ENCOUNTER — Ambulatory Visit (INDEPENDENT_AMBULATORY_CARE_PROVIDER_SITE_OTHER): Payer: Medicare (Managed Care) | Admitting: Cardiology

## 2021-08-20 VITALS — BP 132/84 | HR 72 | Ht 70.0 in | Wt 383.0 lb

## 2021-08-20 DIAGNOSIS — E1169 Type 2 diabetes mellitus with other specified complication: Secondary | ICD-10-CM

## 2021-08-20 DIAGNOSIS — G4733 Obstructive sleep apnea (adult) (pediatric): Secondary | ICD-10-CM | POA: Diagnosis not present

## 2021-08-20 DIAGNOSIS — Z9989 Dependence on other enabling machines and devices: Secondary | ICD-10-CM

## 2021-08-20 DIAGNOSIS — I10 Essential (primary) hypertension: Secondary | ICD-10-CM

## 2021-08-20 DIAGNOSIS — E785 Hyperlipidemia, unspecified: Secondary | ICD-10-CM | POA: Diagnosis not present

## 2021-08-20 DIAGNOSIS — E669 Obesity, unspecified: Secondary | ICD-10-CM | POA: Diagnosis not present

## 2021-08-20 DIAGNOSIS — I471 Supraventricular tachycardia: Secondary | ICD-10-CM

## 2021-08-20 NOTE — Progress Notes (Signed)
I am doing fine Cardiology Office Note:    Date:  08/20/2021   ID:  Timothy Solis, DOB Jan 21, 1966, MRN [id number]  PCP:  Timothy Cables, MD  Cardiologist:  Timothy Balsam, MD    Referring MD: Timothy Cables, MD   No chief complaint on file. I am doing fine  History of Present Illness:    Timothy Solis is a 56 y.o. male with a past medical history significant for essential hypertension, dyslipidemia, morbid obesity, diabetes he was referred to [place] because of palpitations monitor shows supraventricular tachycardia, he was given beta-blocker with excellent response.  He also got some history of atypical chest pain last evaluation was done in 2019 for stress test which was negative.  He comes today to my office for follow-up.  Overall he is doing excellently started being on diet low carbs he also does do some intermittent fasting he lost more than 30 pounds already he is feeling dramatically better he started to be a little more active.  Described 1 episode of chest pain that happened in December.  Lasted only for few minutes.  Past Medical History:  Diagnosis Date   Allergic rhinitis 04/01/2013   Allergy    Arthritis    Asthma 04/01/2013   [facility] Dr Tenny Craw Spirometry done 64% predicted mild reduction/ no obstruction    Atypical chest pain 07/16/2020   Chronic fatigue 11/05/2015   Chronic fatigue syndrome    Costochondritis 09/14/2017   Depression    Diabetes mellitus type 2 in obese (HCC) 07/20/2018   Diabetes, polyneuropathy (HCC) 12/14/2018   Dyslipidemia 08/30/2017   Fibromyalgia    MECFS   Gastroesophageal reflux disease 08/20/2012   GERD (gastroesophageal reflux disease)    HTN (hypertension)    Hyperlipidemia    Hypertension 08/20/2012   IBS (irritable bowel syndrome)    IBS (irritable bowel syndrome)    Irritable bowel syndrome 08/20/2012   Lumbar disc disease with radiculopathy 06/20/2015   Formatting of this note might be different  from the original. L4-5 Disc bulge with left sided pressure on nerve.  No disc rupture  Last Assessment & Plan:  Formatting of this note might be different from the original. Relevant Hx: Course: Daily Update: Today's Plan:  Electronically signed by: Iven Finn II, PA-C 06/20/15 1459   Major depressive disorder with single episode, in remission (HCC) 11/05/2015   Morbid obesity (HCC) 08/20/2012   Nephrolithiasis    Obesity    OSA on CPAP 12/14/2018   Paresthesia 12/14/2018   Sleep apnea    WEARS CPAP   Supraventricular tachycardia (HCC) 08/21/2020    Past Surgical History:  Procedure Laterality Date   BACK SURGERY     kidney stone removal     KNEE SURGERY Bilateral    MOUTH SURGERY     SHOULDER SURGERY Left     Current Medications: Current Meds  Medication Sig   ASPERCREME LIDOCAINE EX Apply 1 application topically daily as needed (pain).   aspirin EC 81 MG tablet Take 81 mg by mouth daily. Swallow whole.   bismuth subsalicylate (PEPTO BISMOL) 262 MG/15ML suspension Take 30 mLs by mouth every 6 (six) hours as needed for indigestion.   diazepam (VALIUM) 5 MG tablet TAKE 1 TABLET BY MOUTH EVERY 8 HOURS AS NEEDED FOR ANXIETY (Patient taking differently: Take 5 mg by mouth every 8 (eight) hours as needed for anxiety. TAKE 1 TABLET BY MOUTH EVERY 8 HOURS AS NEEDED FOR ANXIETY)  dicyclomine (BENTYL) 20 MG tablet Take 1 tablet (20 mg total) by mouth 2 (two) times daily.   diphenhydrAMINE (BENADRYL) 25 MG tablet Take 25 mg by mouth every 6 (six) hours as needed for allergies.   ibuprofen (ADVIL) 200 MG tablet Take 400-800 mg by mouth every 6 (six) hours as needed for moderate pain.   lisinopril (ZESTRIL) 20 MG tablet Take 1 tablet (20 mg) by mouth daily.   loperamide (IMODIUM A-D) 2 MG tablet Take 2-4 mg by mouth 4 (four) times daily as needed for diarrhea or loose stools.   metoprolol tartrate (LOPRESSOR) 25 MG tablet Take 1 tablet (25 mg total) by mouth 2 (two) times daily.    nitroGLYCERIN (NITROSTAT) 0.4 MG SL tablet Place 1 tablet (0.4 mg total) under the tongue every 5 (five) minutes as needed for chest pain.   OVER THE COUNTER MEDICATION Take 50 mg by mouth at bedtime. CBD softgels   pantoprazole (PROTONIX) 20 MG tablet Take 1 tablet (20 mg) by mouth daily.   phenylephrine (SUDAFED PE) 10 MG TABS tablet Take 10 mg by mouth every 4 (four) hours as needed (congestion).   rosuvastatin (CRESTOR) 20 MG tablet Take 1 tablet (20 mg total) by mouth daily.   venlafaxine XR (EFFEXOR-XR) 75 MG 24 hr capsule Take 2 capsules (150 mg total) by mouth daily with breakfast.   Vitamin D, Ergocalciferol, (DRISDOL) 1.25 MG (50000 UNIT) CAPS capsule Take 1 capsule (50,000 Units total) by mouth every 7 (seven) days.     Allergies:   Ceclor [cefaclor], Cyclobenzaprine, Gluten meal, Oxycodone, and Prednisone   Social History   Socioeconomic History   Marital status: Divorced    Spouse name: Not on file   Number of children: 0   Years of education: 14   Highest education level: Not on file  Occupational History   Occupation: Disability  Tobacco Use   Smoking status: Former    Types: Cigars    Quit date: 04/20/2020    Years since quitting: 1.3   Smokeless tobacco: Never  Vaping Use   Vaping Use: Never used  Substance and Sexual Activity   Alcohol use: Not Currently   Drug use: Never   Sexual activity: Not Currently  Other Topics Concern   Not on file  Social History Narrative   Right handed    Caffeine use: Coffee/tea/soda daily   Lives alone   Social Determinants of Health   Financial Resource Strain: Not on file  Food Insecurity: Not on file  Transportation Needs: Not on file  Physical Activity: Not on file  Stress: Not on file  Social Connections: Not on file     Family History: The patient's family history includes Cancer in his brother, maternal grandfather, and another family member; Coronary artery disease in an other family member; Heart disease in his  maternal grandmother and mother; Multiple sclerosis in his brother, brother, father, and maternal uncle. There is no history of Colon cancer, Esophageal cancer, Stomach cancer, or Rectal cancer. ROS:   Please see the history of present illness.    All 14 point review of systems negative except as described per history of present illness  EKGs/Labs/Other Studies Reviewed:      Recent Labs: 04/01/2021: ALT 22; BUN 15; Creatinine, Ser 1.15; Hemoglobin 14.2; Platelets 252.0; Potassium 4.1; Sodium 141; TSH 1.22  Recent Lipid Panel    Component Value Date/Time   CHOL 154 04/01/2021 1548   TRIG 250.0 (H) 04/01/2021 1548   HDL 42.10 04/01/2021 1548  CHOLHDL 4 04/01/2021 1548   VLDL 50.0 (H) 04/01/2021 1548   LDLCALC 53 02/01/2020 1433   LDLDIRECT 81.0 04/01/2021 1548    Physical Exam:    VS:  BP 132/84 (BP Location: Right Arm)    Pulse 72    Ht 5\' 10"  (1.778 m)    Wt (!) 383 lb (173.7 kg)    SpO2 96%    BMI 54.95 kg/m     Wt Readings from Last 3 Encounters:  08/20/21 (!) 383 lb (173.7 kg)  05/17/21 (!) 414 lb (187.8 kg)  04/01/21 (!) 416 lb (188.7 kg)     GEN:  Well nourished, well developed in no acute distress HEENT: Normal NECK: No JVD; No carotid bruits LYMPHATICS: No lymphadenopathy CARDIAC: RRR, no murmurs, no rubs, no gallops RESPIRATORY:  Clear to auscultation without rales, wheezing or rhonchi  ABDOMEN: Soft, non-tender, non-distended MUSCULOSKELETAL:  No edema; No deformity  SKIN: Warm and dry LOWER EXTREMITIES: no swelling NEUROLOGIC:  Alert and oriented x 3 PSYCHIATRIC:  Normal affect   ASSESSMENT:    1. Supraventricular tachycardia (HCC)   2. Primary hypertension   3. OSA on CPAP   4. Diabetes mellitus type 2 in obese (HCC)   5. Dyslipidemia    PLAN:    In order of problems listed above:  Supraventricular tachycardia successfully suppressed with beta-blocker which I will continue. Essential hypertension, blood pressure well controlled continue present  management. Atypical chest pain.  I want him to lose some more weight before we do some evaluation for coronary artery disease.  Therefore I will see him back in my office in the summertime to reevaluate him for that problem I told him if he has more pain he need to let me know. Obstructive sleep apnea on CPAP Dyslipidemia, he is on Crestor already which I will continue.  I did review K PN which show me his last LDL of 53 and HDL 42 excellent cholesterol profile continue present management   Medication Adjustments/Labs and Tests Ordered: Current medicines are reviewed at length with the patient today.  Concerns regarding medicines are outlined above.  No orders of the defined types were placed in this encounter.  Medication changes: No orders of the defined types were placed in this encounter.   Signed, 06/01/21, MD, [facility] 08/20/2021 1:58 PM    [facility]

## 2021-08-20 NOTE — Patient Instructions (Signed)

## 2021-08-23 ENCOUNTER — Other Ambulatory Visit (HOSPITAL_BASED_OUTPATIENT_CLINIC_OR_DEPARTMENT_OTHER): Payer: Self-pay

## 2021-08-23 MED ORDER — PFIZER COVID-19 VAC BIVALENT 30 MCG/0.3ML IM SUSP
INTRAMUSCULAR | 0 refills | Status: DC
  Filled 2021-08-23: qty 0.3, 1d supply, fill #0

## 2021-09-04 ENCOUNTER — Other Ambulatory Visit: Payer: Self-pay

## 2021-09-04 ENCOUNTER — Ambulatory Visit (AMBULATORY_SURGERY_CENTER): Payer: Medicare (Managed Care) | Admitting: *Deleted

## 2021-09-04 VITALS — Ht 70.0 in | Wt 370.0 lb

## 2021-09-04 DIAGNOSIS — Z1211 Encounter for screening for malignant neoplasm of colon: Secondary | ICD-10-CM

## 2021-09-04 DIAGNOSIS — Z8719 Personal history of other diseases of the digestive system: Secondary | ICD-10-CM

## 2021-09-04 MED ORDER — NA SULFATE-K SULFATE-MG SULF 17.5-3.13-1.6 GM/177ML PO SOLN: 1.0000 | Freq: Once | ORAL | 0 refills | Status: AC

## 2021-09-04 NOTE — Progress Notes (Signed)

## 2021-09-10 ENCOUNTER — Ambulatory Visit (INDEPENDENT_AMBULATORY_CARE_PROVIDER_SITE_OTHER): Payer: Medicare (Managed Care)

## 2021-09-10 VITALS — Ht 70.0 in | Wt 370.0 lb

## 2021-09-10 DIAGNOSIS — Z0000 Encounter for general adult medical examination without abnormal findings: Secondary | ICD-10-CM | POA: Diagnosis not present

## 2021-09-10 NOTE — Patient Instructions (Signed)
Timothy Solis , Thank you for taking time to complete your Medicare Wellness Visit. I appreciate your ongoing commitment to your health goals. Please review the following plan we discussed and let me know if I can assist you in the future.   Screening recommendations/referrals: Colonoscopy: Per our conversation, you have an appt scheduled. Recommended yearly ophthalmology/optometry visit for glaucoma screening and checkup Recommended yearly dental visit for hygiene and checkup  Vaccinations: Influenza vaccine: Due-May obtain vaccine at our office or your local pharmacy. Pneumococcal vaccine: Due at age 54 Tdap vinformationaccine: Up to date Shingles vaccine: May obtain vaccine at your local pharmacy. Covid-19: Up to date  Advanced directives: Declined   Conditions/risks identified: See problem list  Next appointment: Follow up in one year for your annual wellness visit   Preventive Care 40-64 Years, Male Preventive care refers to lifestyle choices and visits with your health care provider that can promote health and wellness. What does preventive care include? A yearly physical exam. This is also called an annual well check. Dental exams once or twice a year. Routine eye exams. Ask your health care provider how often you should have your eyes checked. Personal lifestyle choices, including: Daily care of your teeth and gums. Regular physical activity. Eating a healthy diet. Avoiding tobacco and drug use. Limiting alcohol use. Practicing safe sex. Taking low-dose aspirin every day starting at age 54. What happens during an annual well check? The services and screenings done by your health care provider during your annual well check will depend on your age, overall health, lifestyle risk factors, and family history of disease. Counseling  Your health care provider may ask you questions about your: Alcohol use. Tobacco use. Drug use. Emotional well-being. Home and relationship  well-being. Sexual activity. Eating habits. Work and work Statistician. Screening  You may have the following tests or measurements: Height, weight, and BMI. Blood pressure. Lipid and cholesterol levels. These may be checked every 5 years, or more frequently if you are over 18 years old. Skin check. Lung cancer screening. You may have this screening every year starting at age 54 if you have a 30-pack-year history of smoking and currently smoke or have quit within the past 15 years. Fecal occult blood test (FOBT) of the stool. You may have this test every year starting at age 54. Flexible sigmoidoscopy or colonoscopy. You may have a sigmoidoscopy every 5 years or a colonoscopy every 10 years starting at age 54. Prostate cancer screening. Recommendations will vary depending on your family history and other risks. Hepatitis C blood test. Hepatitis B blood test. Sexually transmitted disease (STD) testing. Diabetes screening. This is done by checking your blood sugar (glucose) after you have not eaten for a while (fasting). You may have this done every 1-3 years. Discuss your test results, treatment options, and if necessary, the need for more tests with your health care provider. Vaccines  Your health care provider may recommend certain vaccines, such as: Influenza vaccine. This is recommended every year. Tetanus, diphtheria, and acellular pertussis (Tdap, Td) vaccine. You may need a Td booster every 10 years. Zoster vaccine. You may need this after age 54. Pneumococcal 13-valent conjugate (PCV13) vaccine. You may need this if you have certain conditions and have not been vaccinated. Pneumococcal polysaccharide (PPSV23) vaccine. You may need one or two doses if you smoke cigarettes or if you have certain conditions. Talk to your health care provider about which screenings and vaccines you need and how often you need them. This  information is not intended to replace advice given to you by your  health care provider. Make sure you discuss any questions you have with your health care provider. Document Released: 08/03/2015 Document Revised: 03/26/2016 Document Reviewed: 05/08/2015 Elsevier Interactive Patient Education  2017 [place] Prevention in the Home Falls can cause injuries. They can happen to people of all ages. There are many things you can do to make your home safe and to help prevent falls. What can I do on the outside of my home? Regularly fix the edges of walkways and driveways and fix any cracks. Remove anything that might make you trip as you walk through a door, such as a raised step or threshold. Trim any bushes or trees on the path to your home. Use bright outdoor lighting. Clear any walking paths of anything that might make someone trip, such as rocks or tools. Regularly check to see if handrails are loose or broken. Make sure that both sides of any steps have handrails. Any raised decks and porches should have guardrails on the edges. Have any leaves, snow, or ice cleared regularly. Use sand or salt on walking paths during winter. Clean up any spills in your garage right away. This includes oil or grease spills. What can I do in the bathroom? Use night lights. Install grab bars by the toilet and in the tub and shower. Do not use towel bars as grab bars. Use non-skid mats or decals in the tub or shower. If you need to sit down in the shower, use a plastic, non-slip stool. Keep the floor dry. Clean up any water that spills on the floor as soon as it happens. Remove soap buildup in the tub or shower regularly. Attach bath mats securely with double-sided non-slip rug tape. Do not have throw rugs and other things on the floor that can make you trip. What can I do in the bedroom? Use night lights. Make sure that you have a light by your bed that is easy to reach. Do not use any sheets or blankets that are too big for your bed. They should not hang down  onto the floor. Have a firm chair that has side arms. You can use this for support while you get dressed. Do not have throw rugs and other things on the floor that can make you trip. What can I do in the kitchen? Clean up any spills right away. Avoid walking on wet floors. Keep items that you use a lot in easy-to-reach places. If you need to reach something above you, use a strong step stool that has a grab bar. Keep electrical cords out of the way. Do not use floor polish or wax that makes floors slippery. If you must use wax, use non-skid floor wax. Do not have throw rugs and other things on the floor that can make you trip. What can I do with my stairs? Do not leave any items on the stairs. Make sure that there are handrails on both sides of the stairs and use them. Fix handrails that are broken or loose. Make sure that handrails are as long as the stairways. Check any carpeting to make sure that it is firmly attached to the stairs. Fix any carpet that is loose or worn. Avoid having throw rugs at the top or bottom of the stairs. If you do have throw rugs, attach them to the floor with carpet tape. Make sure that you have a light switch at the top  of the stairs and the bottom of the stairs. If you do not have them, ask someone to add them for you. What else can I do to help prevent falls? Wear shoes that: Do not have high heels. Have rubber bottoms. Are comfortable and fit you well. Are closed at the toe. Do not wear sandals. If you use a stepladder: Make sure that it is fully opened. Do not climb a closed stepladder. Make sure that both sides of the stepladder are locked into place. Ask someone to hold it for you, if possible. Clearly mark and make sure that you can see: Any grab bars or handrails. First and last steps. Where the edge of each step is. Use tools that help you move around (mobility aids) if they are needed. These include: Canes. Walkers. Scooters. Crutches. Turn  on the lights when you go into a dark area. Replace any light bulbs as soon as they burn out. Set up your furniture so you have a clear path. Avoid moving your furniture around. If any of your floors are uneven, fix them. If there are any pets around you, be aware of where they are. Review your medicines with your doctor. Some medicines can make you feel dizzy. This can increase your chance of falling. Ask your doctor what other things that you can do to help prevent falls. This information is not intended to replace advice given to you by your health care provider. Make sure you discuss any questions you have with your health care provider. Document Released: 05/03/2009 Document Revised: 12/13/2015 Document Reviewed: 08/11/2014 Elsevier Interactive Patient Education  2017 [employer].

## 2021-09-10 NOTE — Progress Notes (Signed)
Subjective:   Timothy Solis is a 56 y.o. male who presents for Medicare Annual/Subsequent preventive examination.  I connected with Yeiren today by telephone and verified that I am speaking with the correct person using two identifiers. Location patient: home Location provider: work Persons participating in the virtual visit: patient, Marine scientist.    I discussed the limitations, risks, security and privacy concerns of performing an evaluation and management service by telephone and the availability of in person appointments. I also discussed with the patient that there may be a patient responsible charge related to this service. The patient expressed understanding and verbally consented to this telephonic visit.    Interactive audio and video telecommunications were attempted between this provider and patient, however failed, due to patient having technical difficulties OR patient did not have access to video capability.  We continued and completed visit with audio only.  Some vital signs may be absent or patient reported.   Time Spent with patient on telephone encounter: 25 minutes   Review of Systems     Cardiac Risk Factors include: male gender;hypertension;diabetes mellitus;dyslipidemia;obesity (BMI >30kg/m2)     Objective:    Today's Vitals   09/10/21 1507 09/10/21 1508  Weight: (!) 370 lb (167.8 kg)   Height: 5\' 10"  (1.778 m)   PainSc:  2    Body mass index is 53.09 kg/m.  Advanced Directives 09/10/2021 11/05/2017  Does Patient Have a Medical Advance Directive? No No  Would patient like information on creating a medical advance directive? No - Patient declined Yes (MAU/Ambulatory/Procedural Areas - Information given)    Current Medications (verified) Outpatient Encounter Medications as of 09/10/2021  Medication Sig   ASPERCREME LIDOCAINE EX Apply 1 application topically daily as needed (pain).   aspirin EC 81 MG tablet Take 81 mg by mouth daily. Swallow whole.   bismuth  subsalicylate (PEPTO BISMOL) 262 MG/15ML suspension Take 30 mLs by mouth every 6 (six) hours as needed for indigestion.   diazepam (VALIUM) 5 MG tablet TAKE 1 TABLET BY MOUTH EVERY 8 HOURS AS NEEDED FOR ANXIETY (Patient taking differently: Take 5 mg by mouth every 8 (eight) hours as needed for anxiety. TAKE 1 TABLET BY MOUTH EVERY 8 HOURS AS NEEDED FOR ANXIETY)   dicyclomine (BENTYL) 20 MG tablet Take 1 tablet (20 mg total) by mouth 2 (two) times daily.   diphenhydrAMINE (BENADRYL) 25 MG tablet Take 25 mg by mouth every 6 (six) hours as needed for allergies.   ibuprofen (ADVIL) 200 MG tablet Take 400-800 mg by mouth every 6 (six) hours as needed for moderate pain.   lisinopril (ZESTRIL) 20 MG tablet Take 1 tablet (20 mg) by mouth daily.   loperamide (IMODIUM A-D) 2 MG tablet Take 2-4 mg by mouth 4 (four) times daily as needed for diarrhea or loose stools.   metoprolol tartrate (LOPRESSOR) 25 MG tablet Take 1 tablet (25 mg total) by mouth 2 (two) times daily.   OVER THE COUNTER MEDICATION Take 30 mg by mouth at bedtime. CBD softgels   pantoprazole (PROTONIX) 20 MG tablet Take 1 tablet (20 mg) by mouth daily.   phenylephrine (SUDAFED PE) 10 MG TABS tablet Take 10 mg by mouth every 4 (four) hours as needed (congestion).   rosuvastatin (CRESTOR) 20 MG tablet Take 1 tablet (20 mg total) by mouth daily.   venlafaxine XR (EFFEXOR-XR) 75 MG 24 hr capsule Take 2 capsules (150 mg total) by mouth daily with breakfast.   Vitamin D, Ergocalciferol, (DRISDOL) 1.25 MG (50000  UNIT) CAPS capsule Take 1 capsule (50,000 Units total) by mouth every 7 (seven) days.   COVID-19 mRNA bivalent vaccine, Pfizer, (PFIZER COVID-19 VAC BIVALENT) injection Inject into the muscle. (Patient not taking: Reported on 09/04/2021)   nitroGLYCERIN (NITROSTAT) 0.4 MG SL tablet Place 1 tablet (0.4 mg total) under the tongue every 5 (five) minutes as needed for chest pain.   No facility-administered encounter medications on file as of  09/10/2021.    Allergies (verified) Ceclor [cefaclor], Cyclobenzaprine, Gluten meal, Oxycodone, and Prednisone   History: Past Medical History:  Diagnosis Date   Allergic rhinitis 04/01/2013   Allergy    Anxiety    Arthritis    Asthma 04/01/2013   [facility] [place] Spirometry done 64% predicted mild reduction/ no obstruction    Atypical chest pain 07/16/2020   Chronic fatigue 11/05/2015   Chronic fatigue syndrome    Costochondritis 09/14/2017   Depression    Diabetes mellitus type 2 in obese ([place]) 07/20/2018   Diabetes, polyneuropathy ([place]) 12/14/2018   Dyslipidemia 08/30/2017   Fibromyalgia    MECFS   Gastroesophageal reflux disease 08/20/2012   GERD (gastroesophageal reflux disease)    HTN (hypertension)    Hyperlipidemia    Hypertension 08/20/2012   IBS (irritable bowel syndrome)    IBS (irritable bowel syndrome)    Irritable bowel syndrome 08/20/2012   Lumbar disc disease with radiculopathy 06/20/2015   Formatting of this note might be different from the original. L4-5 Disc bulge with left sided pressure on nerve.  No disc rupture  Last Assessment & Plan:  Formatting of this note might be different from the original. Relevant Hx: Course: Daily Update: Today's Plan:  Electronically signed by: Allie Dimmer II, PA-C 06/20/15 1459   Major depressive disorder with single episode, in remission ([place]) 11/05/2015   ME (myalgic encephalomyelitis)    2016   Morbid obesity ([place]) 08/20/2012   Nephrolithiasis    Obesity    OSA on CPAP 12/14/2018   Paresthesia 12/14/2018   Sleep apnea    WEARS CPAP   Supraventricular tachycardia ([place]) 08/21/2020   Past Surgical History:  Procedure Laterality Date   BACK SURGERY     kidney stone removal     KNEE SURGERY Bilateral    MOUTH SURGERY     SHOULDER SURGERY Left    Family History  Problem Relation Age of Onset   Heart disease Mother    Multiple sclerosis Father    Cancer Brother    Multiple  sclerosis Brother    Multiple sclerosis Brother    Multiple sclerosis Maternal Uncle    Heart disease Maternal Grandmother    Cancer Maternal Grandfather    Coronary artery disease Other    Cancer Other    Colon cancer Neg Hx    Esophageal cancer Neg Hx    Stomach cancer Neg Hx    Rectal cancer Neg Hx    Colon polyps Neg Hx    Social History   Socioeconomic History   Marital status: Divorced    Spouse name: Not on file   Number of children: 0   Years of education: 14   Highest education level: Not on file  Occupational History   Occupation: Disability  Tobacco Use   Smoking status: Former    Types: Cigars    Quit date: 04/20/2020    Years since quitting: 1.3    Passive exposure: Never   Smokeless tobacco: Never  Vaping Use   Vaping Use: Never  used  Substance and Sexual Activity   Alcohol use: Not Currently   Drug use: Never   Sexual activity: Not Currently  Other Topics Concern   Not on file  Social History Narrative   Right handed    Caffeine use: Coffee/tea/soda daily   Lives alone   Social Determinants of Health   Financial Resource Strain: Low Risk    Difficulty of Paying Living Expenses: Not hard at all  Food Insecurity: Food Insecurity Present   Worried About Charity fundraiser in the Last Year: Often true   Arboriculturist in the Last Year: Often true  Transportation Needs: No Transportation Needs   Lack of Transportation (Medical): No   Lack of Transportation (Non-Medical): No  Physical Activity: Inactive   Days of Exercise per Week: 0 days   Minutes of Exercise per Session: 0 min  Stress: No Stress Concern Present   Feeling of Stress : Not at all  Social Connections: Socially Isolated   Frequency of Communication with Friends and Family: More than three times a week   Frequency of Social Gatherings with Friends and Family: Never   Attends Religious Services: Never   Marine scientist or Organizations: No   Attends Arts administrator: Never   Marital Status: Divorced    Tobacco Counseling Counseling given: Not Answered   Clinical Intake:  Pre-visit preparation completed: Yes  Pain : 0-10 Pain Score: 2  Pain Type: Chronic pain Pain Location: Knee Pain Orientation: Right Pain Onset: More than a month ago Pain Frequency: Constant     BMI - recorded: 53.09 Nutritional Status: BMI > 30  Obese Nutritional Risks: None Diabetes: Yes CBG done?: No Did pt. bring in CBG monitor from home?: No (phone visit)  How often do you need to have someone help you when you read instructions, pamphlets, or other written materials from your doctor or pharmacy?: 1 - Never  Diabetes:  Is the patient diabetic?  Yes  If diabetic, was a CBG obtained today?  No  Did the patient bring in their glucometer from home?  No phone  How often do you monitor your CBG's? daily.   Financial Strains and Diabetes Management:  Are you having any financial strains with the device, your supplies or your medication? No .  Does the patient want to be seen by Chronic Care Management for management of their diabetes?  No  Would the patient like to be referred to a Nutritionist or for Diabetic Management?  No   Diabetic Exams:  Diabetic Eye Exam: . Overdue for diabetic eye exam. Pt has been advised about the importance in completing this exam.   Diabetic Foot Exam: Pt has been advised about the importance in completing this exam. To be completed by PCP.   Interpreter Needed?: No  Information entered by :: Caroleen Hamman LPN   Activities of Daily Living In your present state of health, do you have any difficulty performing the following activities: 09/10/2021  Hearing? N  Vision? N  Difficulty concentrating or making decisions? N  Walking or climbing stairs? Y  Comment stairs  Dressing or bathing? N  Doing errands, shopping? N  Preparing Food and eating ? N  Using the Toilet? N  In the past six months, have you accidently  leaked urine? N  Do you have problems with loss of bowel control? N  Managing your Medications? N  Managing your Finances? N  Housekeeping or managing your Housekeeping? N  Some recent data might be hidden    Patient Care Team: Copland, Gay Filler, MD as PCP - General (Family Medicine)  Indicate any recent Medical Services you may have received from other than [facility] providers in the past year (date may be approximate).     Assessment:   This is a routine wellness examination for Timothy Solis.  Hearing/Vision screen Hearing Screening - Comments:: C/o mild hearing loss Vision Screening - Comments:: Last eye exam-3-4  Dietary issues and exercise activities discussed: Current Exercise Habits: The patient does not participate in regular exercise at present   Goals Addressed             This Visit's Progress    Patient Stated       Continue to lose weight & increase activity       Depression Screen PHQ 2/9 Scores 09/10/2021 11/05/2017 09/14/2017 05/07/2016  PHQ - 2 Score 0 0 0 0  PHQ- 9 Score - - 10 -    Fall Risk Fall Risk  09/10/2021 11/05/2017 09/14/2017 05/07/2016  Falls in the past year? 1 Yes Yes Yes  Comment - - last fall november 2018 -  Number falls in past yr: 1 - 2 or more 2 or more  Injury with Fall? 0 - - No  Risk for fall due to : History of fall(s) - Impaired balance/gait;Mental status change -  Follow up Falls prevention discussed - - -    FALL RISK PREVENTION PERTAINING TO THE HOME:  Any stairs in or around the home? Yes  If so, are there any without handrails? No  Home free of loose throw rugs in walkways, pet beds, electrical cords, etc? Yes  Adequate lighting in your home to reduce risk of falls? Yes   ASSISTIVE DEVICES UTILIZED TO PREVENT FALLS:  Life alert? No  Use of a cane, walker or w/c? Yes  Grab bars in the bathroom? Yes  Shower chair or bench in shower? Yes  Elevated toilet seat or a handicapped toilet? Yes   TIMED UP AND GO:  Was the test  performed? No . Phone visit   Cognitive Function:Normal cognitive status assessed by  this Nurse Health Advisor. No abnormalities found.           Immunizations Immunization History  Administered Date(s) Administered   Influenza,inj,Quad PF,6+ Mos 05/07/2016, 08/31/2017, 07/19/2018, 07/04/2020   Influenza-Unspecified 05/19/2019   PFIZER(Purple Top)SARS-COV-2 Vaccination 02/03/2020, 02/24/2020   Pfizer Covid-19 Vaccine Bivalent Booster 49yrs & up 08/19/2021   Pneumococcal Polysaccharide-23 05/19/2019   Tdap 11/05/2015    TDAP status: Up to date  Flu Vaccine status: Due, Education has been provided regarding the importance of this vaccine. Advised may receive this vaccine at local pharmacy or Health Dept. Aware to provide a copy of the vaccination record if obtained from local pharmacy or Health Dept. Verbalized acceptance and understanding.  Pneumococcal vaccine status: Up to date  Covid-19 vaccine status: Completed vaccines  Qualifies for Shingles Vaccine? Yes   Zostavax completed No   Shingrix Completed?: No.    Education has been provided regarding the importance of this vaccine. Patient has been advised to call insurance company to determine out of pocket expense if they have not yet received this vaccine. Advised may also receive vaccine at local pharmacy or Health Dept. Verbalized acceptance and understanding.  Screening Tests Health Maintenance  Topic Date Due   OPHTHALMOLOGY EXAM  Never done   COLONOSCOPY (Pts 45-48yrs Insurance coverage will need to be confirmed)  Never done  Zoster Vaccines- Shingrix (1 of 2) Never done   FOOT EXAM  01/31/2021   INFLUENZA VACCINE  02/18/2021   HEMOGLOBIN A1C  09/29/2021   TETANUS/TDAP  11/04/2025   COVID-19 Vaccine  Completed   Hepatitis C Screening  Completed   HIV Screening  Completed   HPV VACCINES  Aged Out    Health Maintenance  Health Maintenance Due  Topic Date Due   OPHTHALMOLOGY EXAM  Never done   COLONOSCOPY  (Pts 45-51yrs Insurance coverage will need to be confirmed)  Never done   Zoster Vaccines- Shingrix (1 of 2) Never done   FOOT EXAM  01/31/2021   INFLUENZA VACCINE  02/18/2021    Colorectal cancer screening: Patient states he has an appt scheduled.  Lung Cancer Screening: (Low Dose CT Chest recommended if Age 82-80 years, 30 pack-year currently smoking OR have quit w/in 15years.) does not qualify.     Additional Screening:  Hepatitis C Screening: does not qualify  Vision Screening: Recommended annual ophthalmology exams for early detection of glaucoma and other disorders of the eye. Is the patient up to date with their annual eye exam?  No  Who is the provider or what is the name of the office in which the patient attends annual eye exams? none Patient plans to make an appt   Dental Screening: Recommended annual dental exams for proper oral hygiene  Community Resource Referral / Chronic Care Management: CRR required this visit?  No   CCM required this visit?  No      Plan:     I have personally reviewed and noted the following in the patients chart:   Medical and social history Use of alcohol, tobacco or illicit drugs  Current medications and supplements including opioid prescriptions. Patient is not currently taking opioid prescriptions. Functional ability and status Nutritional status Physical activity Advanced directives List of other physicians Hospitalizations, surgeries, and ER visits in previous 12 months Vitals Screenings to include cognitive, depression, and falls Referrals and appointments  In addition, I have reviewed and discussed with patient certain preventive protocols, quality metrics, and best practice recommendations. A written personalized care plan for preventive services as well as general preventive health recommendations were provided to patient.   Due to this being a telephonic visit, the after visit summary with patients personalized plan  was offered to patient via mail or my-chart.  Patient would like to access on my-chart.   Marta Antu, LPN   [id number]  Nurse Health Advisor  Nurse Notes: None

## 2021-09-18 ENCOUNTER — Encounter: Payer: Self-pay | Admitting: Gastroenterology

## 2021-09-19 ENCOUNTER — Telehealth: Payer: Self-pay | Admitting: Family Medicine

## 2021-09-19 NOTE — Telephone Encounter (Signed)
Medication:  ?1.pantoprazole (PROTONIX) 20 MG tablet ?2.Vitamin D, Ergocalciferol, (DRISDOL) 1.25 MG (50000 UNIT) CAPS capsule  ? ?Has the patient contacted their pharmacy? Yes.   ?(If no, request that the patient contact the pharmacy for the refill.) ?(If yes, when and what did the pharmacy advise?) ? ?Preferred Pharmacy (with phone number or street name): [facility] - [place], [place] - [address]  ?[address], [place] [address]  ?Phone:  [phone]  Fax:  [phone]  ? ?Agent: Please be advised that RX refills may take up to 3 business days. We ask that you follow-up with your pharmacy.  ?

## 2021-09-20 ENCOUNTER — Encounter: Payer: Self-pay | Admitting: Family Medicine

## 2021-09-20 MED ORDER — PANTOPRAZOLE SODIUM 20 MG PO TBEC: DELAYED_RELEASE_TABLET | ORAL | 2 refills | Status: DC

## 2021-09-20 NOTE — Telephone Encounter (Signed)
I have sent in the pantoprazole but do you want him to continue the vit d 50,000? ?

## 2021-09-24 NOTE — Patient Instructions (Addendum)
It was great to see you again today, I will be in touch with your labs soon as possible ?Great job with weight loss!  ? ?Please do schedule your eye exam when you can ?Please do get the shingles series at your convenience - at your pharmacy  ? ?Assuming you are doing well please see me in 4-6 months, or sooner if any concerns  ? ? ?

## 2021-09-24 NOTE — Progress Notes (Addendum)
[facility] [address], [address] [place], [place] [address] [phone]  Date:  09/26/2021   Name:  Timothy Solis   DOB:  1966-03-06   MRN:  [id number]  PCP:  Pearline Cables, MD    Chief Complaint: 6 month follow up (Concerns/ questions: pt has a list)   History of Present Illness:  Timothy Solis is a 56 y.o. very pleasant male patient who presents with the following:  Patient seen today for periodic follow-up-  diet controlled diabetes with neuropathy, OSA, obesity, chronic fatigue/encephalomyelitis myalgic encephalomyelitis, dyslipidemia, limited mobility, B12 and vitamin D deficiency Most recent visit with myself was in September At our last visit he was doing well on venlafaxine 150 and Valium as needed  He is doing a keto diet and intermittent fasting and has lost significant weight He notes more energy and is more physically active since he lost weight   He was 416lbs in September - now he is 370 lbs He tries to stay in ketosis all the time by doing low carb  He is eating lots of vegetables and protein, avoiding sugar totally   He ended up not seeing a psychologist- he has felt better since he started fasting   Eye exam- he plans to do soon  Colon cancer screening- will be done this month  Shingrix he will do at pharmacy  Foot exam is due Flu shot?- will do next year  Complete labs done in September, at that time B12 and vitamin D were low  He is not yet taking vitamin D or B12 -we can check on his levels today Lab Results  Component Value Date   HGBA1C 6.7 (H) 04/01/2021   Lisinopril 20 Toprol 25 twice daily Crestor 20 Effexor 150 Vitamin D  He feels like his mood is good, he is taking effexor   Patient Active Problem List   Diagnosis Date Noted   Arthritis 08/14/2021   B12 deficiency 04/02/2021   Supraventricular tachycardia (HCC) 08/21/2020   Obesity    Nephrolithiasis    IBS (irritable  bowel syndrome)    Chronic fatigue syndrome    Allergy    Atypical chest pain 07/16/2020   Diabetes, polyneuropathy (HCC) 12/14/2018   OSA on CPAP 12/14/2018   Paresthesia 12/14/2018   Diabetes mellitus type 2 in obese (HCC) 07/20/2018   Costochondritis 09/14/2017   Dyslipidemia 08/30/2017   Major depressive disorder with single episode, in remission (HCC) 11/05/2015   Chronic fatigue 11/05/2015   Fibromyalgia 06/20/2015   Allergic rhinitis 04/01/2013   Asthma 04/01/2013   Morbid obesity (HCC) 08/20/2012   Depression 08/20/2012   Gastroesophageal reflux disease 08/20/2012   Hypertension 08/20/2012    Past Medical History:  Diagnosis Date   Allergic rhinitis 04/01/2013   Allergy    Anxiety    Arthritis    Asthma 04/01/2013   [facility] Dr Tenny Craw Spirometry done 64% predicted mild reduction/ no obstruction    Atypical chest pain 07/16/2020   Chronic fatigue 11/05/2015   Chronic fatigue syndrome    Costochondritis 09/14/2017   Depression    Diabetes mellitus type 2 in obese (HCC) 07/20/2018   Diabetes, polyneuropathy (HCC) 12/14/2018   Dyslipidemia 08/30/2017   Fibromyalgia    MECFS   Gastroesophageal reflux disease 08/20/2012   GERD (gastroesophageal reflux disease)    HTN (hypertension)    Hyperlipidemia    Hypertension 08/20/2012   IBS (irritable bowel syndrome)  IBS (irritable bowel syndrome)    Irritable bowel syndrome 08/20/2012   Lumbar disc disease with radiculopathy 06/20/2015   Formatting of this note might be different from the original. L4-5 Disc bulge with left sided pressure on nerve.  No disc rupture  Last Assessment & Plan:  Formatting of this note might be different from the original. Relevant Hx: Course: Daily Update: Today's Plan:  Electronically signed by: Iven Finn II, PA-C 06/20/15 1459   Major depressive disorder with single episode, in remission (HCC) 11/05/2015   ME (myalgic encephalomyelitis)    2016   Morbid  obesity (HCC) 08/20/2012   Nephrolithiasis    Obesity    OSA on CPAP 12/14/2018   Paresthesia 12/14/2018   Sleep apnea    WEARS CPAP   Supraventricular tachycardia (HCC) 08/21/2020    Past Surgical History:  Procedure Laterality Date   BACK SURGERY     kidney stone removal     KNEE SURGERY Bilateral    MOUTH SURGERY     SHOULDER SURGERY Left     Social History   Tobacco Use   Smoking status: Former    Types: Cigars    Quit date: 04/20/2020    Years since quitting: 1.4    Passive exposure: Never   Smokeless tobacco: Never  Vaping Use   Vaping Use: Never used  Substance Use Topics   Alcohol use: Not Currently   Drug use: Never    Family History  Problem Relation Age of Onset   Heart disease Mother    Multiple sclerosis Father    Cancer Brother    Multiple sclerosis Brother    Multiple sclerosis Brother    Multiple sclerosis Maternal Uncle    Heart disease Maternal Grandmother    Cancer Maternal Grandfather    Coronary artery disease Other    Cancer Other    Colon cancer Neg Hx    Esophageal cancer Neg Hx    Stomach cancer Neg Hx    Rectal cancer Neg Hx    Colon polyps Neg Hx     Allergies  Allergen Reactions   Ceclor [Cefaclor]     Can't remember what reaction had   Cyclobenzaprine Other (See Comments)    Serotonin syndrome per pt   Gluten Meal     Triggers allergy attack    Oxycodone Itching   Prednisone     cramping and sickness symptoms- just doesn't want a large dose    Medication list has been reviewed and updated.  Current Outpatient Medications on File Prior to Visit  Medication Sig Dispense Refill   ASPERCREME LIDOCAINE EX Apply 1 application topically daily as needed (pain).     aspirin EC 81 MG tablet Take 81 mg by mouth daily. Swallow whole.     bismuth subsalicylate (PEPTO BISMOL) 262 MG/15ML suspension Take 30 mLs by mouth every 6 (six) hours as needed for indigestion.     diazepam (VALIUM) 5 MG tablet TAKE 1 TABLET BY MOUTH EVERY 8  HOURS AS NEEDED FOR ANXIETY (Patient taking differently: Take 5 mg by mouth every 8 (eight) hours as needed for anxiety. TAKE 1 TABLET BY MOUTH EVERY 8 HOURS AS NEEDED FOR ANXIETY) 90 tablet 1   dicyclomine (BENTYL) 20 MG tablet Take 1 tablet (20 mg total) by mouth 2 (two) times daily. 180 tablet 1   diphenhydrAMINE (BENADRYL) 25 MG tablet Take 25 mg by mouth every 6 (six) hours as needed for allergies.     ibuprofen (ADVIL) 200  MG tablet Take 400-800 mg by mouth every 6 (six) hours as needed for moderate pain.     lisinopril (ZESTRIL) 20 MG tablet Take 1 tablet (20 mg) by mouth daily. 90 tablet 3   loperamide (IMODIUM A-D) 2 MG tablet Take 2-4 mg by mouth 4 (four) times daily as needed for diarrhea or loose stools.     metoprolol tartrate (LOPRESSOR) 25 MG tablet Take 1 tablet (25 mg total) by mouth 2 (two) times daily. 180 tablet 1   OVER THE COUNTER MEDICATION Take 30 mg by mouth at bedtime. CBD softgels     pantoprazole (PROTONIX) 20 MG tablet Take 1 tablet (20 mg) by mouth daily. 30 tablet 2   phenylephrine (SUDAFED PE) 10 MG TABS tablet Take 10 mg by mouth every 4 (four) hours as needed (congestion).     rosuvastatin (CRESTOR) 20 MG tablet Take 1 tablet (20 mg total) by mouth daily. 90 tablet 1   venlafaxine XR (EFFEXOR-XR) 75 MG 24 hr capsule Take 2 capsules (150 mg total) by mouth daily with breakfast. 180 capsule 1   Vitamin D, Ergocalciferol, (DRISDOL) 1.25 MG (50000 UNIT) CAPS capsule Take 1 capsule (50,000 Units total) by mouth every 7 (seven) days. 13 capsule 3   nitroGLYCERIN (NITROSTAT) 0.4 MG SL tablet Place 1 tablet (0.4 mg total) under the tongue every 5 (five) minutes as needed for chest pain. 90 tablet 3   No current facility-administered medications on file prior to visit.    Review of Systems:  As per HPI- otherwise negative.   Physical Examination: Vitals:   09/26/21 1350  BP: 130/84  Pulse: 82  Resp: 18  Temp: 99 F (37.2 C)  SpO2: 97%   Vitals:   09/26/21  1350  Weight: (!) 370 lb 3.2 oz (167.9 kg)   Body mass index is 53.12 kg/m. Ideal Body Weight:    GEN: no acute distress.  Obese but has lost significant weight Uses a cane per baseline  HEENT: Atraumatic, Normocephalic.  Ears and Nose: No external deformity. CV: RRR, No M/G/R. No JVD. No thrill. No extra heart sounds. PULM: CTA B, no wheezes, crackles, rhonchi. No retractions. No resp. distress. No accessory muscle use. ABD: S, NT, ND, +BS. No rebound. No HSM. EXTR: No c/c/e PSYCH: Normally interactive. Conversant.  Foot exam- neuropathy in right toes, otherwise normal   Assessment and Plan: Chronic fatigue  Dyslipidemia - Plan: Lipid panel  Diabetes mellitus type 2 in obese (HCC) - Plan: Hemoglobin A1c, Comprehensive metabolic panel  Essential hypertension - Plan: Comprehensive metabolic panel, lisinopril (ZESTRIL) 20 MG tablet  Vitamin D deficiency - Plan: VITAMIN D 25 Hydroxy (Vit-D Deficiency, Fractures)  B12 deficiency - Plan: Vitamin B12  Weight loss - Plan: Comprehensive metabolic panel  Patient seen today for follow-up.  He has lost significant weight following a low-carb and ketogenic diet. Praised his efforts-  await labs as above  Colonoscopy is scheduled He plans to do shingrix at pharmacy Encouraged eye exam Signed Abbe AmsterdamJessica Yerania Chamorro, MD  Addnd 3/10- received labs as below, message to pt  Results for orders placed or performed in visit on 09/26/21  Hemoglobin A1c  Result Value Ref Range   Hgb A1c MFr Bld 5.9 4.6 - 6.5 %  VITAMIN D 25 Hydroxy (Vit-D Deficiency, Fractures)  Result Value Ref Range   VITD 29.68 (L) 30.00 - 100.00 ng/mL  Vitamin B12  Result Value Ref Range   Vitamin B-12 153 (L) 211 - 911 pg/mL  Comprehensive metabolic panel  Result Value Ref Range   Sodium 140 135 - 145 mEq/L   Potassium 4.2 3.5 - 5.1 mEq/L   Chloride 101 96 - 112 mEq/L   CO2 30 19 - 32 mEq/L   Glucose, Bld 91 70 - 99 mg/dL   BUN 16 6 - 23 mg/dL   Creatinine, Ser  4.00 0.40 - 1.50 mg/dL   Total Bilirubin 0.5 0.2 - 1.2 mg/dL   Alkaline Phosphatase 76 39 - 117 U/L   AST 18 0 - 37 U/L   ALT 19 0 - 53 U/L   Total Protein 6.9 6.0 - 8.3 g/dL   Albumin 4.2 3.5 - 5.2 g/dL   GFR 86.76 >19.50 mL/min   Calcium 9.3 8.4 - 10.5 mg/dL  Lipid panel  Result Value Ref Range   Cholesterol 120 0 - 200 mg/dL   Triglycerides 932.6 (H) 0.0 - 149.0 mg/dL   HDL 71.24 (L) >58.09 mg/dL   VLDL 98.3 0.0 - 38.2 mg/dL   LDL Cholesterol 58 0 - 99 mg/dL   Total CHOL/HDL Ratio 4    NonHDL 90.88

## 2021-09-26 ENCOUNTER — Ambulatory Visit: Payer: Medicare (Managed Care) | Admitting: Family Medicine

## 2021-09-26 VITALS — BP 130/84 | HR 82 | Temp 99.0°F | Resp 18 | Wt 370.2 lb

## 2021-09-26 DIAGNOSIS — E1169 Type 2 diabetes mellitus with other specified complication: Secondary | ICD-10-CM | POA: Diagnosis not present

## 2021-09-26 DIAGNOSIS — R5382 Chronic fatigue, unspecified: Secondary | ICD-10-CM

## 2021-09-26 DIAGNOSIS — E669 Obesity, unspecified: Secondary | ICD-10-CM | POA: Diagnosis not present

## 2021-09-26 DIAGNOSIS — E559 Vitamin D deficiency, unspecified: Secondary | ICD-10-CM | POA: Diagnosis not present

## 2021-09-26 DIAGNOSIS — I10 Essential (primary) hypertension: Secondary | ICD-10-CM

## 2021-09-26 DIAGNOSIS — R634 Abnormal weight loss: Secondary | ICD-10-CM | POA: Diagnosis not present

## 2021-09-26 DIAGNOSIS — E538 Deficiency of other specified B group vitamins: Secondary | ICD-10-CM | POA: Diagnosis not present

## 2021-09-26 DIAGNOSIS — E785 Hyperlipidemia, unspecified: Secondary | ICD-10-CM

## 2021-09-26 MED ORDER — LISINOPRIL 20 MG PO TABS: ORAL_TABLET | ORAL | 3 refills | Status: DC

## 2021-09-27 ENCOUNTER — Encounter (HOSPITAL_COMMUNITY): Payer: Self-pay | Admitting: Gastroenterology

## 2021-09-27 ENCOUNTER — Encounter: Payer: Self-pay | Admitting: Family Medicine

## 2021-09-27 LAB — COMPREHENSIVE METABOLIC PANEL
ALT: 19 U/L (ref 0–53)
AST: 18 U/L (ref 0–37)
Albumin: 4.2 g/dL (ref 3.5–5.2)
Alkaline Phosphatase: 76 U/L (ref 39–117)
BUN: 16 mg/dL (ref 6–23)
CO2: 30 mEq/L (ref 19–32)
Calcium: 9.3 mg/dL (ref 8.4–10.5)
Chloride: 101 mEq/L (ref 96–112)
Creatinine, Ser: 1.18 mg/dL (ref 0.40–1.50)
GFR: 70.39 mL/min (ref >60.00)
Glucose, Bld: 91 mg/dL (ref 70–99)
Potassium: 4.2 mEq/L (ref 3.5–5.1)
Sodium: 140 mEq/L (ref 135–145)
Total Bilirubin: 0.5 mg/dL (ref 0.2–1.2)
Total Protein: 6.9 g/dL (ref 6.0–8.3)

## 2021-09-27 LAB — LIPID PANEL
Cholesterol: 120 mg/dL (ref 0–200)
HDL: 29.6 mg/dL — ABNORMAL LOW (ref >39.00)
LDL Cholesterol: 58 mg/dL (ref 0–99)
NonHDL: 90.88
Total CHOL/HDL Ratio: 4
Triglycerides: 162 mg/dL — ABNORMAL HIGH (ref 0.0–149.0)
VLDL: 32.4 mg/dL (ref 0.0–40.0)

## 2021-09-27 LAB — VITAMIN B12: Vitamin B-12: 153 pg/mL — ABNORMAL LOW (ref 211–911)

## 2021-09-27 LAB — HEMOGLOBIN A1C: Hgb A1c MFr Bld: 5.9 % (ref 4.6–6.5)

## 2021-09-27 LAB — VITAMIN D 25 HYDROXY (VIT D DEFICIENCY, FRACTURES): VITD: 29.68 ng/mL — ABNORMAL LOW (ref 30.00–100.00)

## 2021-09-27 NOTE — Progress Notes (Signed)
Attempted to obtain medical history via telephone, unable to reach at this time. I left a voicemail to return pre surgical testing department's phone call.  

## 2021-10-02 ENCOUNTER — Telehealth: Payer: Self-pay | Admitting: Gastroenterology

## 2021-10-02 NOTE — Telephone Encounter (Signed)
Patient called and wants to cancel his in-hospital procedure.  He will call back later to reschedule. ?

## 2021-10-02 NOTE — Telephone Encounter (Signed)
[facility] Scheduling notified. Procedure canceled: ?Please see notes below FYI  ?

## 2021-10-03 NOTE — Telephone Encounter (Signed)
Thanks for letting me know RG 

## 2021-10-07 ENCOUNTER — Ambulatory Visit (HOSPITAL_COMMUNITY)
Admission: RE | Admit: 2021-10-07 | Payer: Medicare (Managed Care) | Source: Home / Self Care | Admitting: Gastroenterology

## 2021-10-07 ENCOUNTER — Encounter (HOSPITAL_COMMUNITY): Admission: RE | Payer: Self-pay | Source: Home / Self Care

## 2021-10-07 SURGERY — ESOPHAGOGASTRODUODENOSCOPY (EGD) WITH PROPOFOL
Anesthesia: Monitor Anesthesia Care

## 2021-10-23 DIAGNOSIS — M25561 Pain in right knee: Secondary | ICD-10-CM | POA: Diagnosis not present

## 2021-10-23 DIAGNOSIS — G8929 Other chronic pain: Secondary | ICD-10-CM | POA: Diagnosis not present

## 2021-10-23 DIAGNOSIS — M1711 Unilateral primary osteoarthritis, right knee: Secondary | ICD-10-CM | POA: Diagnosis not present

## 2021-10-24 DIAGNOSIS — M1711 Unilateral primary osteoarthritis, right knee: Secondary | ICD-10-CM | POA: Diagnosis not present

## 2021-10-24 HISTORY — DX: Unilateral primary osteoarthritis, right knee: M17.11

## 2022-01-17 ENCOUNTER — Other Ambulatory Visit: Payer: Self-pay | Admitting: Family Medicine

## 2022-01-27 ENCOUNTER — Ambulatory Visit: Payer: Medicare (Managed Care) | Admitting: Cardiology

## 2022-02-17 ENCOUNTER — Other Ambulatory Visit: Payer: Self-pay | Admitting: Family Medicine

## 2022-02-17 DIAGNOSIS — K589 Irritable bowel syndrome without diarrhea: Secondary | ICD-10-CM

## 2022-02-17 DIAGNOSIS — E785 Hyperlipidemia, unspecified: Secondary | ICD-10-CM

## 2022-03-31 NOTE — Progress Notes (Deleted)
[facility] [address] [place], [place] [address] [phone]  Date:  04/02/2022   Name:  Timothy Solis   DOB:  1966/03/19   MRN:  [id number]  PCP:  Pearline Cables, MD    Chief Complaint: No chief complaint on file.   History of Present Illness:  Timothy Solis is a 56 y.o. very pleasant male patient who presents with the following:  Patient seen today for periodic follow-up-   diet controlled diabetes with neuropathy, OSA, obesity, chronic fatigue/encephalomyelitis myalgic encephalomyelitis, dyslipidemia, limited mobility, B12 and vitamin D deficiency Most recent visit with myself was in March of this year.  At our last visit he had loss about 45 pounds using diet changes and was feeling much better  Eye exam Colon cancer screening Shingrix Flu shot Can update A1c today Complete labs done in March-at that time vitamin D was slightly low, B12 low  Lisinopril 20 Metoprolol 25 twice daily Vitamin D Crestor 20 Venlafaxine 150 B12  He has been seeing Dr. Magdalene River with [facility] regarding his knee pain Patient Active Problem List   Diagnosis Date Noted   Primary osteoarthritis of right knee 10/24/2021   Arthritis 08/14/2021   B12 deficiency 04/02/2021   Synovitis of right knee 04/02/2021   Supraventricular tachycardia (HCC) 08/21/2020   Obesity    Nephrolithiasis    IBS (irritable bowel syndrome)    Chronic fatigue syndrome    Allergy    Atypical chest pain 07/16/2020   Diabetes, polyneuropathy (HCC) 12/14/2018   OSA on CPAP 12/14/2018   Paresthesia 12/14/2018   Diabetes mellitus type 2 in obese (HCC) 07/20/2018   Costochondritis 09/14/2017   Dyslipidemia 08/30/2017   Major depressive disorder with single episode, in remission (HCC) 11/05/2015   Chronic fatigue 11/05/2015   Fibromyalgia 06/20/2015   Allergic rhinitis 04/01/2013   Asthma 04/01/2013   Morbid obesity (HCC)  08/20/2012   Depression 08/20/2012   Gastroesophageal reflux disease 08/20/2012   Hypertension 08/20/2012    Past Medical History:  Diagnosis Date   Allergic rhinitis 04/01/2013   Allergy    Anxiety    Arthritis    Asthma 04/01/2013   [facility] Dr Tenny Craw Spirometry done 64% predicted mild reduction/ no obstruction    Atypical chest pain 07/16/2020   Chronic fatigue 11/05/2015   Chronic fatigue syndrome    Costochondritis 09/14/2017   Depression    Diabetes mellitus type 2 in obese (HCC) 07/20/2018   Diabetes, polyneuropathy (HCC) 12/14/2018   Dyslipidemia 08/30/2017   Fibromyalgia    MECFS   Gastroesophageal reflux disease 08/20/2012   GERD (gastroesophageal reflux disease)    HTN (hypertension)    Hyperlipidemia    Hypertension 08/20/2012   IBS (irritable bowel syndrome)    IBS (irritable bowel syndrome)    Irritable bowel syndrome 08/20/2012   Lumbar disc disease with radiculopathy 06/20/2015   Formatting of this note might be different from the original. L4-5 Disc bulge with left sided pressure on nerve.  No disc rupture  Last Assessment & Plan:  Formatting of this note might be different from the original. Relevant Hx: Course: Daily Update: Today's Plan:  Electronically signed by: Iven Finn II, PA-C 06/20/15 1459   Major depressive disorder with single episode, in remission (HCC) 11/05/2015   ME (myalgic encephalomyelitis)    2016   Morbid obesity (HCC) 08/20/2012   Nephrolithiasis    Obesity  OSA on CPAP 12/14/2018   Paresthesia 12/14/2018   Primary osteoarthritis of right knee 10/24/2021   Sleep apnea    WEARS CPAP   Supraventricular tachycardia (HCC) 08/21/2020   Synovitis of right knee 04/02/2021    Past Surgical History:  Procedure Laterality Date   BACK SURGERY     kidney stone removal     KNEE SURGERY Bilateral    MOUTH SURGERY     SHOULDER SURGERY Left     Social History   Tobacco Use   Smoking status: Former     Types: Cigars    Quit date: 04/20/2020    Years since quitting: 1.9    Passive exposure: Never   Smokeless tobacco: Never  Vaping Use   Vaping Use: Never used  Substance Use Topics   Alcohol use: Not Currently   Drug use: Never    Family History  Problem Relation Age of Onset   Heart disease Mother    Multiple sclerosis Father    Cancer Brother    Multiple sclerosis Brother    Multiple sclerosis Brother    Multiple sclerosis Maternal Uncle    Heart disease Maternal Grandmother    Cancer Maternal Grandfather    Coronary artery disease Other    Cancer Other    Colon cancer Neg Hx    Esophageal cancer Neg Hx    Stomach cancer Neg Hx    Rectal cancer Neg Hx    Colon polyps Neg Hx     Allergies  Allergen Reactions   Ceclor [Cefaclor]     Can't remember what reaction had   Cyclobenzaprine Other (See Comments)    Serotonin syndrome per pt   Gluten Meal     Triggers allergy attack    Oxycodone Itching   Prednisone     cramping and sickness symptoms- just doesn't want a large dose    Medication list has been reviewed and updated.  Current Outpatient Medications on File Prior to Visit  Medication Sig Dispense Refill   albuterol (VENTOLIN HFA) 108 (90 Base) MCG/ACT inhaler Inhale 2 puffs into the lungs every 6 (six) hours as needed for wheezing or shortness of breath.     ASPERCREME LIDOCAINE EX Apply 1 application topically daily as needed (pain).     aspirin EC 81 MG tablet Take 81 mg by mouth daily. Swallow whole.     bismuth subsalicylate (PEPTO BISMOL) 262 MG/15ML suspension Take 30 mLs by mouth every 6 (six) hours as needed for indigestion.     Cholecalciferol (VITAMIN D) 50 MCG (2000 UT) tablet Take 2,000 Units by mouth daily.     diazepam (VALIUM) 5 MG tablet TAKE 1 TABLET BY MOUTH EVERY 8 HOURS AS NEEDED FOR ANXIETY 90 tablet 1   dicyclomine (BENTYL) 20 MG tablet TAKE ONE TABLET BY MOUTH TWICE DAILY 180 tablet 1   diphenhydrAMINE (BENADRYL) 25 MG tablet Take 25 mg  by mouth every 6 (six) hours as needed for allergies.     ibuprofen (ADVIL) 200 MG tablet Take 400-800 mg by mouth every 6 (six) hours as needed for moderate pain.     lisinopril (ZESTRIL) 20 MG tablet Take 1 tablet (20 mg) by mouth daily. 90 tablet 3   loperamide (IMODIUM A-D) 2 MG tablet Take 2-4 mg by mouth 4 (four) times daily as needed for diarrhea or loose stools.     metoprolol tartrate (LOPRESSOR) 25 MG tablet TAKE ONE TABLET BY MOUTH TWICE DAILY 180 tablet 1   nitroGLYCERIN (NITROSTAT) 0.4  MG SL tablet Place 0.4 mg under the tongue every 5 (five) minutes as needed for chest pain.     Omega-3 Fatty Acids (FISH OIL) 1000 MG CAPS Take 2,000 mg by mouth daily.     OVER THE COUNTER MEDICATION Take 30 mg by mouth at bedtime. CBD softgels     pantoprazole (PROTONIX) 20 MG tablet Take 1 tablet (20 mg total) by mouth daily. 90 tablet 1   phenylephrine (SUDAFED PE) 10 MG TABS tablet Take 10 mg by mouth every 4 (four) hours as needed (congestion).     rosuvastatin (CRESTOR) 20 MG tablet TAKE ONE (1) TABLET BY MOUTH EACH DAY 90 tablet 1   venlafaxine XR (EFFEXOR-XR) 75 MG 24 hr capsule TAKE TWO CAPSULES BY MOUTH EACH DAY WITHBREAKFAST 180 capsule 1   vitamin B-12 (CYANOCOBALAMIN) 1000 MCG tablet Take 1,000 mcg by mouth daily.     No current facility-administered medications on file prior to visit.    Review of Systems:  As per HPI- otherwise negative.   Physical Examination: There were no vitals filed for this visit. There were no vitals filed for this visit. There is no height or weight on file to calculate BMI. Ideal Body Weight:    GEN: no acute distress. HEENT: Atraumatic, Normocephalic.  Ears and Nose: No external deformity. CV: RRR, No M/G/R. No JVD. No thrill. No extra heart sounds. PULM: CTA B, no wheezes, crackles, rhonchi. No retractions. No resp. distress. No accessory muscle use. ABD: S, NT, ND, +BS. No rebound. No HSM. EXTR: No c/c/e PSYCH: Normally interactive.  Conversant.    Assessment and Plan: ***  Signed Abbe Amsterdam, MD

## 2022-04-02 ENCOUNTER — Ambulatory Visit: Payer: Medicare (Managed Care) | Admitting: Family Medicine

## 2022-04-02 DIAGNOSIS — I10 Essential (primary) hypertension: Secondary | ICD-10-CM

## 2022-04-02 DIAGNOSIS — E785 Hyperlipidemia, unspecified: Secondary | ICD-10-CM

## 2022-04-02 DIAGNOSIS — E559 Vitamin D deficiency, unspecified: Secondary | ICD-10-CM

## 2022-04-02 DIAGNOSIS — G4733 Obstructive sleep apnea (adult) (pediatric): Secondary | ICD-10-CM

## 2022-04-02 DIAGNOSIS — E538 Deficiency of other specified B group vitamins: Secondary | ICD-10-CM

## 2022-04-02 DIAGNOSIS — E1169 Type 2 diabetes mellitus with other specified complication: Secondary | ICD-10-CM

## 2022-04-02 DIAGNOSIS — R5382 Chronic fatigue, unspecified: Secondary | ICD-10-CM

## 2022-04-15 NOTE — Patient Instructions (Incomplete)
It was great to see you again today, I will be in touch with your labs  Please see me in about 6 months assuming all is well  I would recommend getting the shingles vaccine series-Shingrix-at your pharmacy  We will start on Mounjaro for weight loss/ diabetes control 2/5 mg weekly to start- we can go up as needed and tolerated.  Please let me know how this works for you Referral to [place] for an opinion on your knee Referral to GI for a colonoscopy- need to make sure no evidence of colon cancer!    Flu shot given Recommend eye exam

## 2022-04-15 NOTE — Progress Notes (Unsigned)
[place] [facility] [address], [place], [place] [address] [phone] [id number]  Date:  04/16/2022   Name:  Timothy Solis   DOB:  28-Jan-1966   MRN:  [id number]  PCP:  Darreld Mclean, MD    Chief Complaint: No chief complaint on file.   History of Present Illness:  Timothy Solis is a 56 y.o. very pleasant male patient who presents with the following:  Patient seen today for periodic follow-up-history of diet controlled diabetes with neuropathy, OSA, obesity, chronic fatigue/encephalomyelitis myalgic encephalomyelitis, dyslipidemia, limited mobility, B12 and vitamin D deficiency  Most recent visit with myself about 6 months ago in March; at that time he was making very good progress with a keto diet and intermittent fasting.  September/22 weight 416 pounds  Eye exam Urine microalbumin Colon cancer screening Shingrix Flu shot Can update labs today-in March we did CMP, lipid, vitamin D, B12, A1c B12 and vitamin D both mildly low in March  Patient Active Problem List   Diagnosis Date Noted   Primary osteoarthritis of right knee 10/24/2021   Arthritis 08/14/2021   B12 deficiency 04/02/2021   Synovitis of right knee 04/02/2021   Supraventricular tachycardia ([place]) 08/21/2020   Obesity    Nephrolithiasis    IBS (irritable bowel syndrome)    Chronic fatigue syndrome    Allergy    Atypical chest pain 07/16/2020   Diabetes, polyneuropathy ([place]) 12/14/2018   OSA on CPAP 12/14/2018   Paresthesia 12/14/2018   Diabetes mellitus type 2 in obese ([place]) 07/20/2018   Costochondritis 09/14/2017   Dyslipidemia 08/30/2017   Major depressive disorder with single episode, in remission ([place]) 11/05/2015   Chronic fatigue 11/05/2015   Fibromyalgia 06/20/2015   Allergic rhinitis 04/01/2013   Asthma 04/01/2013   Morbid obesity ([place]) 08/20/2012   Depression 08/20/2012   Gastroesophageal reflux disease 08/20/2012   Hypertension  08/20/2012    Past Medical History:  Diagnosis Date   Allergic rhinitis 04/01/2013   Allergy    Anxiety    Arthritis    Asthma 04/01/2013   [facility] Harrington [place] Spirometry done 64% predicted mild reduction/ no obstruction    Atypical chest pain 07/16/2020   Chronic fatigue 11/05/2015   Chronic fatigue syndrome    Costochondritis 09/14/2017   Depression    Diabetes mellitus type 2 in obese ([place]) 07/20/2018   Diabetes, polyneuropathy ([place]) 12/14/2018   Dyslipidemia 08/30/2017   Fibromyalgia    MECFS   Gastroesophageal reflux disease 08/20/2012   GERD (gastroesophageal reflux disease)    HTN (hypertension)    Hyperlipidemia    Hypertension 08/20/2012   IBS (irritable bowel syndrome)    IBS (irritable bowel syndrome)    Irritable bowel syndrome 08/20/2012   Lumbar disc disease with radiculopathy 06/20/2015   Formatting of this note might be different from the original. L4-5 Disc bulge with left sided pressure on nerve.  No disc rupture  Last Assessment & Plan:  Formatting of this note might be different from the original. Relevant Hx: Course: Daily Update: Today's Plan:  Electronically signed by: Allie Dimmer II, PA-C 06/20/15 1459   Major depressive disorder with single episode, in remission ([place]) 11/05/2015   ME (myalgic encephalomyelitis)    2016   Morbid obesity ([place]) 08/20/2012   Nephrolithiasis    Obesity    OSA on CPAP 12/14/2018   Paresthesia 12/14/2018   Primary osteoarthritis of right knee 10/24/2021   Sleep  apnea    WEARS CPAP   Supraventricular tachycardia (HCC) 08/21/2020   Synovitis of right knee 04/02/2021    Past Surgical History:  Procedure Laterality Date   BACK SURGERY     kidney stone removal     KNEE SURGERY Bilateral    MOUTH SURGERY     SHOULDER SURGERY Left     Social History   Tobacco Use   Smoking status: Former    Types: Cigars    Quit date: 04/20/2020    Years since quitting: 1.9    Passive exposure: Never    Smokeless tobacco: Never  Vaping Use   Vaping Use: Never used  Substance Use Topics   Alcohol use: Not Currently   Drug use: Never    Family History  Problem Relation Age of Onset   Heart disease Mother    Multiple sclerosis Father    Cancer Brother    Multiple sclerosis Brother    Multiple sclerosis Brother    Multiple sclerosis Maternal Uncle    Heart disease Maternal Grandmother    Cancer Maternal Grandfather    Coronary artery disease Other    Cancer Other    Colon cancer Neg Hx    Esophageal cancer Neg Hx    Stomach cancer Neg Hx    Rectal cancer Neg Hx    Colon polyps Neg Hx     Allergies  Allergen Reactions   Ceclor [Cefaclor]     Can't remember what reaction had   Cyclobenzaprine Other (See Comments)    Serotonin syndrome per pt   Gluten Meal     Triggers allergy attack    Oxycodone Itching   Prednisone     cramping and sickness symptoms- just doesn't want a large dose    Medication list has been reviewed and updated.  Current Outpatient Medications on File Prior to Visit  Medication Sig Dispense Refill   albuterol (VENTOLIN HFA) 108 (90 Base) MCG/ACT inhaler Inhale 2 puffs into the lungs every 6 (six) hours as needed for wheezing or shortness of breath.     ASPERCREME LIDOCAINE EX Apply 1 application topically daily as needed (pain).     aspirin EC 81 MG tablet Take 81 mg by mouth daily. Swallow whole.     bismuth subsalicylate (PEPTO BISMOL) 262 MG/15ML suspension Take 30 mLs by mouth every 6 (six) hours as needed for indigestion.     Cholecalciferol (VITAMIN D) 50 MCG (2000 UT) tablet Take 2,000 Units by mouth daily.     diazepam (VALIUM) 5 MG tablet TAKE 1 TABLET BY MOUTH EVERY 8 HOURS AS NEEDED FOR ANXIETY 90 tablet 1   dicyclomine (BENTYL) 20 MG tablet TAKE ONE TABLET BY MOUTH TWICE DAILY 180 tablet 1   diphenhydrAMINE (BENADRYL) 25 MG tablet Take 25 mg by mouth every 6 (six) hours as needed for allergies.     ibuprofen (ADVIL) 200 MG tablet Take  400-800 mg by mouth every 6 (six) hours as needed for moderate pain.     lisinopril (ZESTRIL) 20 MG tablet Take 1 tablet (20 mg) by mouth daily. 90 tablet 3   loperamide (IMODIUM A-D) 2 MG tablet Take 2-4 mg by mouth 4 (four) times daily as needed for diarrhea or loose stools.     metoprolol tartrate (LOPRESSOR) 25 MG tablet TAKE ONE TABLET BY MOUTH TWICE DAILY 180 tablet 1   nitroGLYCERIN (NITROSTAT) 0.4 MG SL tablet Place 0.4 mg under the tongue every 5 (five) minutes as needed for chest pain.  Omega-3 Fatty Acids (FISH OIL) 1000 MG CAPS Take 2,000 mg by mouth daily.     OVER THE COUNTER MEDICATION Take 30 mg by mouth at bedtime. CBD softgels     pantoprazole (PROTONIX) 20 MG tablet Take 1 tablet (20 mg total) by mouth daily. 90 tablet 1   phenylephrine (SUDAFED PE) 10 MG TABS tablet Take 10 mg by mouth every 4 (four) hours as needed (congestion).     rosuvastatin (CRESTOR) 20 MG tablet TAKE ONE (1) TABLET BY MOUTH EACH DAY 90 tablet 1   venlafaxine XR (EFFEXOR-XR) 75 MG 24 hr capsule TAKE TWO CAPSULES BY MOUTH EACH DAY WITHBREAKFAST 180 capsule 1   vitamin B-12 (CYANOCOBALAMIN) 1000 MCG tablet Take 1,000 mcg by mouth daily.     No current facility-administered medications on file prior to visit.    Review of Systems:  As per HPI- otherwise negative.   Physical Examination: There were no vitals filed for this visit. There were no vitals filed for this visit. There is no height or weight on file to calculate BMI. Ideal Body Weight:    GEN: no acute distress. HEENT: Atraumatic, Normocephalic.  Ears and Nose: No external deformity. CV: RRR, No M/G/R. No JVD. No thrill. No extra heart sounds. PULM: CTA B, no wheezes, crackles, rhonchi. No retractions. No resp. distress. No accessory muscle use. ABD: S, NT, ND, +BS. No rebound. No HSM. EXTR: No c/c/e PSYCH: Normally interactive. Conversant.    Assessment and Plan: ***  Signed Abbe [place], MD

## 2022-04-16 ENCOUNTER — Ambulatory Visit (HOSPITAL_BASED_OUTPATIENT_CLINIC_OR_DEPARTMENT_OTHER)
Admission: RE | Admit: 2022-04-16 | Discharge: 2022-04-16 | Disposition: A | Discharge status: Home / Self Care | Payer: Medicare (Managed Care) | Source: Ambulatory Visit | Attending: Family Medicine | Admitting: Family Medicine

## 2022-04-16 ENCOUNTER — Ambulatory Visit: Payer: Medicare (Managed Care) | Admitting: Family Medicine

## 2022-04-16 ENCOUNTER — Encounter: Payer: Self-pay | Admitting: Family Medicine

## 2022-04-16 VITALS — BP 118/80 | HR 77 | Temp 97.8°F | Resp 18 | Ht 70.0 in | Wt 363.4 lb

## 2022-04-16 DIAGNOSIS — M545 Low back pain, unspecified: Secondary | ICD-10-CM

## 2022-04-16 DIAGNOSIS — E1169 Type 2 diabetes mellitus with other specified complication: Secondary | ICD-10-CM | POA: Diagnosis not present

## 2022-04-16 DIAGNOSIS — Z125 Encounter for screening for malignant neoplasm of prostate: Secondary | ICD-10-CM

## 2022-04-16 DIAGNOSIS — E785 Hyperlipidemia, unspecified: Secondary | ICD-10-CM | POA: Diagnosis not present

## 2022-04-16 DIAGNOSIS — R195 Other fecal abnormalities: Secondary | ICD-10-CM

## 2022-04-16 DIAGNOSIS — E669 Obesity, unspecified: Secondary | ICD-10-CM | POA: Diagnosis not present

## 2022-04-16 DIAGNOSIS — I10 Essential (primary) hypertension: Secondary | ICD-10-CM

## 2022-04-16 DIAGNOSIS — G8929 Other chronic pain: Secondary | ICD-10-CM

## 2022-04-16 DIAGNOSIS — M25561 Pain in right knee: Secondary | ICD-10-CM

## 2022-04-16 DIAGNOSIS — E538 Deficiency of other specified B group vitamins: Secondary | ICD-10-CM

## 2022-04-16 DIAGNOSIS — E559 Vitamin D deficiency, unspecified: Secondary | ICD-10-CM

## 2022-04-16 DIAGNOSIS — R5382 Chronic fatigue, unspecified: Secondary | ICD-10-CM | POA: Diagnosis not present

## 2022-04-16 DIAGNOSIS — Z23 Encounter for immunization: Secondary | ICD-10-CM | POA: Diagnosis not present

## 2022-04-16 MED ORDER — TIRZEPATIDE 2.5 MG/0.5ML ~~LOC~~ SOAJ: 2.5000 mg | SUBCUTANEOUS | 3 refills | Status: DC

## 2022-04-17 ENCOUNTER — Encounter: Payer: Self-pay | Admitting: Family Medicine

## 2022-04-17 LAB — PSA: PSA: 0.63 ng/mL (ref 0.10–4.00)

## 2022-04-17 LAB — BASIC METABOLIC PANEL
BUN: 21 mg/dL (ref 6–23)
CO2: 29 mEq/L (ref 19–32)
Calcium: 9.3 mg/dL (ref 8.4–10.5)
Chloride: 100 mEq/L (ref 96–112)
Creatinine, Ser: 1.2 mg/dL (ref 0.40–1.50)
GFR: 68.72 mL/min (ref >60.00)
Glucose, Bld: 81 mg/dL (ref 70–99)
Potassium: 4.6 mEq/L (ref 3.5–5.1)
Sodium: 138 mEq/L (ref 135–145)

## 2022-04-17 LAB — HEMOGLOBIN A1C: Hgb A1c MFr Bld: 6 % (ref 4.6–6.5)

## 2022-04-18 ENCOUNTER — Encounter: Payer: Self-pay | Admitting: Family Medicine

## 2022-04-23 ENCOUNTER — Ambulatory Visit: Payer: Medicare (Managed Care) | Attending: Cardiology | Admitting: Cardiology

## 2022-04-23 ENCOUNTER — Encounter: Payer: Self-pay | Admitting: Cardiology

## 2022-04-23 VITALS — BP 120/86 | HR 74 | Ht 70.0 in | Wt 355.0 lb

## 2022-04-23 DIAGNOSIS — E785 Hyperlipidemia, unspecified: Secondary | ICD-10-CM | POA: Diagnosis not present

## 2022-04-23 DIAGNOSIS — E669 Obesity, unspecified: Secondary | ICD-10-CM | POA: Diagnosis not present

## 2022-04-23 DIAGNOSIS — I10 Essential (primary) hypertension: Secondary | ICD-10-CM | POA: Diagnosis not present

## 2022-04-23 DIAGNOSIS — I471 Supraventricular tachycardia, unspecified: Secondary | ICD-10-CM | POA: Diagnosis not present

## 2022-04-23 DIAGNOSIS — G4733 Obstructive sleep apnea (adult) (pediatric): Secondary | ICD-10-CM

## 2022-04-23 DIAGNOSIS — E1169 Type 2 diabetes mellitus with other specified complication: Secondary | ICD-10-CM

## 2022-04-23 NOTE — Patient Instructions (Signed)

## 2022-04-23 NOTE — Progress Notes (Signed)
Cardiology Office Note:    Date:  04/23/2022   ID:  Timothy Solis, DOB 15-Nov-1965, MRN [id number]  PCP:  Pearline Cables, MD  Cardiologist:  Gypsy Balsam, MD    Referring MD: Pearline Cables, MD   Chief Complaint  Patient presents with   Follow-up  Doing well  History of Present Illness:    Timothy Solis is a 56 y.o. male with past medical history significant for essential hypertension, dyslipidemia, diabetes, morbid obesity supraventricular tachycardia.  He is doing well in terms aspect.  He denies of any chest pain tightness squeezing pressure burning chest.  He did have some atypical chest pain but we postpone reevaluation for coronary disease because of atypical characteristic of the symptoms as well as the fact that he is morbidly obese he did have a stress test done in 2019 which was negative.  He is working hard on losing weight with good success he lost already 75 pounds.  Past Medical History:  Diagnosis Date   Allergic rhinitis 04/01/2013   Allergy    Anxiety    Arthritis    Asthma 04/01/2013   [facility] Dr Tenny Craw Spirometry done 64% predicted mild reduction/ no obstruction    Atypical chest pain 07/16/2020   Chronic fatigue 11/05/2015   Chronic fatigue syndrome    Costochondritis 09/14/2017   Depression    Diabetes mellitus type 2 in obese (HCC) 07/20/2018   Diabetes, polyneuropathy (HCC) 12/14/2018   Dyslipidemia 08/30/2017   Fibromyalgia    MECFS   Gastroesophageal reflux disease 08/20/2012   GERD (gastroesophageal reflux disease)    HTN (hypertension)    Hyperlipidemia    Hypertension 08/20/2012   IBS (irritable bowel syndrome)    IBS (irritable bowel syndrome)    Irritable bowel syndrome 08/20/2012   Lumbar disc disease with radiculopathy 06/20/2015   Formatting of this note might be different from the original. L4-5 Disc bulge with left sided pressure on nerve.  No disc rupture  Last Assessment & Plan:  Formatting of this  note might be different from the original. Relevant Hx: Course: Daily Update: Today's Plan:  Electronically signed by: Iven Finn II, PA-C 06/20/15 1459   Major depressive disorder with single episode, in remission (HCC) 11/05/2015   ME (myalgic encephalomyelitis)    2016   Morbid obesity (HCC) 08/20/2012   Nephrolithiasis    Obesity    OSA on CPAP 12/14/2018   Paresthesia 12/14/2018   Primary osteoarthritis of right knee 10/24/2021   Sleep apnea    WEARS CPAP   Supraventricular tachycardia 08/21/2020   Synovitis of right knee 04/02/2021    Past Surgical History:  Procedure Laterality Date   BACK SURGERY     kidney stone removal     KNEE SURGERY Bilateral    MOUTH SURGERY     SHOULDER SURGERY Left     Current Medications: Current Meds  Medication Sig   albuterol (VENTOLIN HFA) 108 (90 Base) MCG/ACT inhaler Inhale 2 puffs into the lungs every 6 (six) hours as needed for wheezing or shortness of breath.   ASPERCREME LIDOCAINE EX Apply 1 application topically daily as needed (pain).   aspirin EC 81 MG tablet Take 81 mg by mouth daily. Swallow whole.   bismuth subsalicylate (PEPTO BISMOL) 262 MG/15ML suspension Take 30 mLs by mouth every 6 (six) hours as needed for indigestion.   Cholecalciferol (VITAMIN D) 50 MCG (2000 UT) tablet Take 2,000 Units by mouth daily.   diazepam (VALIUM)  5 MG tablet TAKE 1 TABLET BY MOUTH EVERY 8 HOURS AS NEEDED FOR ANXIETY   dicyclomine (BENTYL) 20 MG tablet TAKE ONE TABLET BY MOUTH TWICE DAILY   dimenhyDRINATE (DRAMAMINE PO) Take 1 tablet by mouth as needed (nausea).   diphenhydrAMINE (BENADRYL) 25 MG tablet Take 25 mg by mouth every 6 (six) hours as needed for allergies.   ibuprofen (ADVIL) 200 MG tablet Take 400-800 mg by mouth every 6 (six) hours as needed for moderate pain.   lisinopril (ZESTRIL) 20 MG tablet Take 1 tablet (20 mg) by mouth daily.   loperamide (IMODIUM A-D) 2 MG tablet Take 2-4 mg by mouth 4 (four) times daily as needed for  diarrhea or loose stools.   metoprolol tartrate (LOPRESSOR) 25 MG tablet TAKE ONE TABLET BY MOUTH TWICE DAILY   nitroGLYCERIN (NITROSTAT) 0.4 MG SL tablet Place 0.4 mg under the tongue every 5 (five) minutes as needed for chest pain.   Omega-3 Fatty Acids (FISH OIL) 1000 MG CAPS Take 2,000 mg by mouth daily.   OVER THE COUNTER MEDICATION Take 30 mg by mouth at bedtime. CBD softgels   pantoprazole (PROTONIX) 20 MG tablet Take 1 tablet (20 mg total) by mouth daily.   phenylephrine (SUDAFED PE) 10 MG TABS tablet Take 10 mg by mouth every 4 (four) hours as needed (congestion).   rosuvastatin (CRESTOR) 20 MG tablet TAKE ONE (1) TABLET BY MOUTH EACH DAY (Patient taking differently: Take 20 mg by mouth daily.)   tirzepatide (MOUNJARO) 2.5 MG/0.5ML Pen Inject 2.5 mg into the skin once a week.   venlafaxine XR (EFFEXOR-XR) 75 MG 24 hr capsule TAKE TWO CAPSULES BY MOUTH EACH DAY WITHBREAKFAST (Patient taking differently: Take 75 mg by mouth daily with breakfast.)   vitamin B-12 (CYANOCOBALAMIN) 1000 MCG tablet Take 1,000 mcg by mouth daily.     Allergies:   Ceclor [cefaclor], Cyclobenzaprine, Gluten meal, Oxycodone, and Prednisone   Social History   Socioeconomic History   Marital status: Divorced    Spouse name: Not on file   Number of children: 0   Years of education: 14   Highest education level: Not on file  Occupational History   Occupation: Disability  Tobacco Use   Smoking status: Former    Types: Cigars    Quit date: 04/20/2020    Years since quitting: 2.0    Passive exposure: Never   Smokeless tobacco: Never  Vaping Use   Vaping Use: Never used  Substance and Sexual Activity   Alcohol use: Not Currently   Drug use: Never   Sexual activity: Not Currently  Other Topics Concern   Not on file  Social History Narrative   Right handed    Caffeine use: Coffee/tea/soda daily   Lives alone   Social Determinants of Health   Financial Resource Strain: Low Risk  (09/10/2021)    Overall Financial Resource Strain (CARDIA)    Difficulty of Paying Living Expenses: Not hard at all  Food Insecurity: Food Insecurity Present (09/10/2021)   Hunger Vital Sign    Worried About Running Out of Food in the Last Year: Often true    Ran Out of Food in the Last Year: Often true  Transportation Needs: No Transportation Needs (09/10/2021)   PRAPARE - Administrator, Civil Service (Medical): No    Lack of Transportation (Non-Medical): No  Physical Activity: Inactive (09/10/2021)   Exercise Vital Sign    Days of Exercise per Week: 0 days    Minutes of Exercise  per Session: 0 min  Stress: No Stress Concern Present (09/10/2021)   [employer] of Occupational Health - Occupational Stress Questionnaire    Feeling of Stress : Not at all  Social Connections: Socially Isolated (09/10/2021)   Social Connection and Isolation Panel [NHANES]    Frequency of Communication with Friends and Family: More than three times a week    Frequency of Social Gatherings with Friends and Family: Never    Attends Religious Services: Never    Database administrator or Organizations: No    Attends Engineer, structural: Never    Marital Status: Divorced     Family History: The patient's family history includes Cancer in his brother, maternal grandfather, and another family member; Coronary artery disease in an other family member; Heart disease in his maternal grandmother and mother; Multiple sclerosis in his brother, brother, father, and maternal uncle. There is no history of Colon cancer, Esophageal cancer, Stomach cancer, Rectal cancer, or Colon polyps. ROS:   Please see the history of present illness.    All 14 point review of systems negative except as described per history of present illness  EKGs/Labs/Other Studies Reviewed:      Recent Labs: 09/26/2021: ALT 19 04/16/2022: BUN 21; Creatinine, Ser 1.20; Potassium 4.6; Sodium 138  Recent Lipid Panel    Component Value  Date/Time   CHOL 120 09/26/2021 1424   TRIG 162.0 (H) 09/26/2021 1424   HDL 29.60 (L) 09/26/2021 1424   CHOLHDL 4 09/26/2021 1424   VLDL 32.4 09/26/2021 1424   LDLCALC 58 09/26/2021 1424   LDLDIRECT 81.0 04/01/2021 1548    Physical Exam:    VS:  BP 120/86 (BP Location: Left Arm, Patient Position: Sitting)   Pulse 74   Ht 5\' 10"  (1.778 m)   Wt (!) 355 lb (161 kg)   PF 95 L/min   BMI 50.94 kg/m     Wt Readings from Last 3 Encounters:  04/23/22 (!) 355 lb (161 kg)  04/16/22 (!) 363 lb 6.4 oz (164.8 kg)  09/26/21 (!) 370 lb 3.2 oz (167.9 kg)     GEN:  Well nourished, well developed in no acute distress HEENT: Normal NECK: No JVD; No carotid bruits LYMPHATICS: No lymphadenopathy CARDIAC: RRR, no murmurs, no rubs, no gallops RESPIRATORY:  Clear to auscultation without rales, wheezing or rhonchi  ABDOMEN: Soft, non-tender, non-distended MUSCULOSKELETAL:  No edema; No deformity  SKIN: Warm and dry LOWER EXTREMITIES: no swelling NEUROLOGIC:  Alert and oriented x 3 PSYCHIATRIC:  Normal affect   ASSESSMENT:    1. Supraventricular tachycardia   2. Hypertension, unspecified type   3. OSA on CPAP   4. Diabetes mellitus type 2 in obese (HCC)   5. Dyslipidemia   6. Morbid obesity (HCC)    PLAN:    In order of problems listed above:  Supraventricular tachycardia successfully suppressed asymptomatic continue present management Diabetes mellitus that being followed by internal medicine team.  He is hemoglobin A1c is 6.0 this is from April 16, 2022.  He is continue losing weight he was put on Mounjaro recently.  Doing very well Obstructive sleep apnea on CPAP Dyslipidemia did review K PN which show me LDL 58 HDL 29.  Good cholesterol profile continue present management He is doing amazingly well he is growing his own vegetables at home he is also trying to be a little more active   Medication Adjustments/Labs and Tests Ordered: Current medicines are reviewed at length with  the patient today.  Concerns regarding medicines are outlined above.  No orders of the defined types were placed in this encounter.  Medication changes: No orders of the defined types were placed in this encounter.   Signed, Park Liter, MD, [facility] 04/23/2022 1:17 PM    [place]

## 2022-05-14 MED ORDER — TIRZEPATIDE 5 MG/0.5ML ~~LOC~~ SOAJ: 5.0000 mg | SUBCUTANEOUS | 1 refills | Status: DC

## 2022-05-14 NOTE — Addendum Note (Signed)
Addended by: Lamar Blinks C on: 05/14/2022 12:05 PM   Modules accepted: Orders

## 2022-05-30 NOTE — Progress Notes (Unsigned)
[facility] [address], [address] [place], [place] [address] [phone]  Date:  06/02/2022   Name:  Timothy Solis   DOB:  11/10/1965   MRN:  [id number]  PCP:  Pearline Cables, MD    Chief Complaint: No chief complaint on file.   History of Present Illness:  Timothy Solis is a 56 y.o. very pleasant male patient who presents with the following:  Patient seen today with concern of possible ear infection Most recent visit with myself was in September- -history of diet controlled diabetes with neuropathy, OSA, obesity, chronic fatigue/encephalomyelitis myalgic encephalomyelitis, dyslipidemia, limited mobility, B12 and vitamin D deficiency   He has been using a keto diet for weight loss with good success  Eye exam Lab Results  Component Value Date   HGBA1C 6.0 04/16/2022     Patient Active Problem List   Diagnosis Date Noted   Primary osteoarthritis of right knee 10/24/2021   Arthritis 08/14/2021   B12 deficiency 04/02/2021   Synovitis of right knee 04/02/2021   Supraventricular tachycardia 08/21/2020   Obesity    Nephrolithiasis    IBS (irritable bowel syndrome)    Chronic fatigue syndrome    Allergy    Atypical chest pain 07/16/2020   Diabetes, polyneuropathy (HCC) 12/14/2018   OSA on CPAP 12/14/2018   Paresthesia 12/14/2018   Diabetes mellitus type 2 in obese (HCC) 07/20/2018   Costochondritis 09/14/2017   Dyslipidemia 08/30/2017   Major depressive disorder with single episode, in remission (HCC) 11/05/2015   Chronic fatigue 11/05/2015   Fibromyalgia 06/20/2015   Allergic rhinitis 04/01/2013   Asthma 04/01/2013   Morbid obesity (HCC) 08/20/2012   Depression 08/20/2012   Gastroesophageal reflux disease 08/20/2012   Hypertension 08/20/2012    Past Medical History:  Diagnosis Date   Allergic rhinitis 04/01/2013   Allergy    Anxiety    Arthritis    Asthma 04/01/2013   [facility]  Allergy Dr Tenny Craw Spirometry done 64% predicted mild reduction/ no obstruction    Atypical chest pain 07/16/2020   Chronic fatigue 11/05/2015   Chronic fatigue syndrome    Costochondritis 09/14/2017   Depression    Diabetes mellitus type 2 in obese (HCC) 07/20/2018   Diabetes, polyneuropathy (HCC) 12/14/2018   Dyslipidemia 08/30/2017   Fibromyalgia    MECFS   Gastroesophageal reflux disease 08/20/2012   GERD (gastroesophageal reflux disease)    HTN (hypertension)    Hyperlipidemia    Hypertension 08/20/2012   IBS (irritable bowel syndrome)    IBS (irritable bowel syndrome)    Irritable bowel syndrome 08/20/2012   Lumbar disc disease with radiculopathy 06/20/2015   Formatting of this note might be different from the original. L4-5 Disc bulge with left sided pressure on nerve.  No disc rupture  Last Assessment & Plan:  Formatting of this note might be different from the original. Relevant Hx: Course: Daily Update: Today's Plan:  Electronically signed by: Iven Finn II, PA-C 06/20/15 1459   Major depressive disorder with single episode, in remission (HCC) 11/05/2015   ME (myalgic encephalomyelitis)    2016   Morbid obesity (HCC) 08/20/2012   Nephrolithiasis    Obesity    OSA on CPAP 12/14/2018   Paresthesia 12/14/2018   Primary osteoarthritis of right knee 10/24/2021   Sleep apnea    WEARS CPAP   Supraventricular tachycardia 08/21/2020   Synovitis of right knee 04/02/2021    Past Surgical  History:  Procedure Laterality Date   BACK SURGERY     kidney stone removal     KNEE SURGERY Bilateral    MOUTH SURGERY     SHOULDER SURGERY Left     Social History   Tobacco Use   Smoking status: Former    Types: Cigars    Quit date: 04/20/2020    Years since quitting: 2.1    Passive exposure: Never   Smokeless tobacco: Never  Vaping Use   Vaping Use: Never used  Substance Use Topics   Alcohol use: Not Currently   Drug use: Never    Family History  Problem Relation Age  of Onset   Heart disease Mother    Multiple sclerosis Father    Cancer Brother    Multiple sclerosis Brother    Multiple sclerosis Brother    Multiple sclerosis Maternal Uncle    Heart disease Maternal Grandmother    Cancer Maternal Grandfather    Coronary artery disease Other    Cancer Other    Colon cancer Neg Hx    Esophageal cancer Neg Hx    Stomach cancer Neg Hx    Rectal cancer Neg Hx    Colon polyps Neg Hx     Allergies  Allergen Reactions   Ceclor [Cefaclor]     Can't remember what reaction had   Cyclobenzaprine Other (See Comments)    Serotonin syndrome per pt   Gluten Meal     Triggers allergy attack    Oxycodone Itching   Prednisone     cramping and sickness symptoms- just doesn't want a large dose    Medication list has been reviewed and updated.  Current Outpatient Medications on File Prior to Visit  Medication Sig Dispense Refill   albuterol (VENTOLIN HFA) 108 (90 Base) MCG/ACT inhaler Inhale 2 puffs into the lungs every 6 (six) hours as needed for wheezing or shortness of breath.     ASPERCREME LIDOCAINE EX Apply 1 application topically daily as needed (pain).     aspirin EC 81 MG tablet Take 81 mg by mouth daily. Swallow whole.     bismuth subsalicylate (PEPTO BISMOL) 262 MG/15ML suspension Take 30 mLs by mouth every 6 (six) hours as needed for indigestion.     Cholecalciferol (VITAMIN D) 50 MCG (2000 UT) tablet Take 2,000 Units by mouth daily.     diazepam (VALIUM) 5 MG tablet TAKE 1 TABLET BY MOUTH EVERY 8 HOURS AS NEEDED FOR ANXIETY 90 tablet 1   dicyclomine (BENTYL) 20 MG tablet TAKE ONE TABLET BY MOUTH TWICE DAILY 180 tablet 1   dimenhyDRINATE (DRAMAMINE PO) Take 1 tablet by mouth as needed (nausea).     diphenhydrAMINE (BENADRYL) 25 MG tablet Take 25 mg by mouth every 6 (six) hours as needed for allergies.     ibuprofen (ADVIL) 200 MG tablet Take 400-800 mg by mouth every 6 (six) hours as needed for moderate pain.     lisinopril (ZESTRIL) 20 MG  tablet Take 1 tablet (20 mg) by mouth daily. 90 tablet 3   loperamide (IMODIUM A-D) 2 MG tablet Take 2-4 mg by mouth 4 (four) times daily as needed for diarrhea or loose stools.     metoprolol tartrate (LOPRESSOR) 25 MG tablet TAKE ONE TABLET BY MOUTH TWICE DAILY 180 tablet 1   nitroGLYCERIN (NITROSTAT) 0.4 MG SL tablet Place 0.4 mg under the tongue every 5 (five) minutes as needed for chest pain.     Omega-3 Fatty Acids (FISH OIL) 1000  MG CAPS Take 2,000 mg by mouth daily.     OVER THE COUNTER MEDICATION Take 30 mg by mouth at bedtime. CBD softgels     pantoprazole (PROTONIX) 20 MG tablet Take 1 tablet (20 mg total) by mouth daily. 90 tablet 1   phenylephrine (SUDAFED PE) 10 MG TABS tablet Take 10 mg by mouth every 4 (four) hours as needed (congestion).     rosuvastatin (CRESTOR) 20 MG tablet TAKE ONE (1) TABLET BY MOUTH EACH DAY (Patient taking differently: Take 20 mg by mouth daily.) 90 tablet 1   tirzepatide (MOUNJARO) 5 MG/0.5ML Pen Inject 5 mg into the skin once a week. 6 mL 1   venlafaxine XR (EFFEXOR-XR) 75 MG 24 hr capsule TAKE TWO CAPSULES BY MOUTH EACH DAY WITHBREAKFAST (Patient taking differently: Take 75 mg by mouth daily with breakfast.) 180 capsule 1   vitamin B-12 (CYANOCOBALAMIN) 1000 MCG tablet Take 1,000 mcg by mouth daily.     No current facility-administered medications on file prior to visit.    Review of Systems:  As per HPI- otherwise negative.   Physical Examination: There were no vitals filed for this visit. There were no vitals filed for this visit. There is no height or weight on file to calculate BMI. Ideal Body Weight:    GEN: no acute distress. HEENT: Atraumatic, Normocephalic.  Ears and Nose: No external deformity. CV: RRR, No M/G/R. No JVD. No thrill. No extra heart sounds. PULM: CTA B, no wheezes, crackles, rhonchi. No retractions. No resp. distress. No accessory muscle use. ABD: S, NT, ND, +BS. No rebound. No HSM. EXTR: No c/c/e PSYCH: Normally  interactive. Conversant.    Assessment and Plan: ***  Signed Abbe Amsterdam, MD

## 2022-06-02 ENCOUNTER — Ambulatory Visit: Payer: Medicare (Managed Care) | Admitting: Family Medicine

## 2022-06-02 VITALS — BP 120/80 | HR 81 | Temp 97.9°F | Resp 18 | Ht 70.0 in | Wt 348.4 lb

## 2022-06-02 DIAGNOSIS — F325 Major depressive disorder, single episode, in full remission: Secondary | ICD-10-CM | POA: Diagnosis not present

## 2022-06-02 DIAGNOSIS — H6592 Unspecified nonsuppurative otitis media, left ear: Secondary | ICD-10-CM | POA: Diagnosis not present

## 2022-06-02 MED ORDER — AMOXICILLIN 500 MG PO CAPS: 1000.0000 mg | ORAL_CAPSULE | Freq: Two times a day (BID) | ORAL | 0 refills | Status: DC

## 2022-06-02 MED ORDER — DIAZEPAM 5 MG PO TABS: ORAL_TABLET | ORAL | 1 refills | Status: DC

## 2022-06-02 NOTE — Patient Instructions (Signed)
Good to see you again- keep up the good work! Amoxicillin for possible ear infection Let me know if not seeing improvement in about 48 hours- sooner if getting worse

## 2022-06-06 ENCOUNTER — Encounter: Payer: Self-pay | Admitting: Family Medicine

## 2022-06-06 MED ORDER — PREDNISONE 20 MG PO TABS: ORAL_TABLET | ORAL | 0 refills | Status: DC

## 2022-06-06 NOTE — Addendum Note (Signed)
Addended by: Abbe Amsterdam C on: 06/06/2022 06:51 PM   Modules accepted: Orders

## 2022-06-06 NOTE — Addendum Note (Signed)
Addended by: Abbe Amsterdam C on: 06/06/2022 06:37 PM   Modules accepted: Orders

## 2022-08-13 ENCOUNTER — Other Ambulatory Visit: Payer: Self-pay | Admitting: *Deleted

## 2022-08-13 ENCOUNTER — Encounter: Payer: Self-pay | Admitting: Family Medicine

## 2022-08-13 DIAGNOSIS — E1169 Type 2 diabetes mellitus with other specified complication: Secondary | ICD-10-CM

## 2022-08-13 DIAGNOSIS — E669 Obesity, unspecified: Secondary | ICD-10-CM

## 2022-08-13 MED ORDER — TIRZEPATIDE 7.5 MG/0.5ML ~~LOC~~ SOAJ: 7.5000 mg | SUBCUTANEOUS | 1 refills | Status: DC

## 2022-08-29 ENCOUNTER — Telehealth: Payer: Self-pay | Admitting: Family Medicine

## 2022-08-29 NOTE — Telephone Encounter (Signed)
Copied from [place] [id number]. Topic: Medicare AWV >> Aug 29, 2022 10:21 AM Devoria Glassing wrote: Reason for CRM: Left message for patient to schedule Annual Wellness Visit(AWV).  Please schedule with Health Nurse Advisor at [facility]. Please call [phone] ask for [facility].

## 2022-09-17 ENCOUNTER — Other Ambulatory Visit: Payer: Self-pay

## 2022-09-17 DIAGNOSIS — E785 Hyperlipidemia, unspecified: Secondary | ICD-10-CM

## 2022-09-17 MED ORDER — ROSUVASTATIN CALCIUM 20 MG PO TABS: 20.0000 mg | ORAL_TABLET | Freq: Every day | ORAL | 1 refills | Status: DC

## 2022-09-22 ENCOUNTER — Other Ambulatory Visit: Payer: Self-pay

## 2022-09-22 DIAGNOSIS — K589 Irritable bowel syndrome without diarrhea: Secondary | ICD-10-CM

## 2022-09-22 MED ORDER — PANTOPRAZOLE SODIUM 20 MG PO TBEC: 20.0000 mg | DELAYED_RELEASE_TABLET | Freq: Every day | ORAL | 1 refills | Status: DC

## 2022-09-22 MED ORDER — VENLAFAXINE HCL ER 75 MG PO CP24: 75.0000 mg | ORAL_CAPSULE | Freq: Every day | ORAL | 1 refills | Status: DC

## 2022-09-22 MED ORDER — DICYCLOMINE HCL 20 MG PO TABS: 20.0000 mg | ORAL_TABLET | Freq: Two times a day (BID) | ORAL | 1 refills | Status: DC

## 2022-09-22 MED ORDER — METOPROLOL TARTRATE 25 MG PO TABS: 25.0000 mg | ORAL_TABLET | Freq: Two times a day (BID) | ORAL | 1 refills | Status: DC

## 2022-10-08 ENCOUNTER — Encounter: Payer: Self-pay | Admitting: Family Medicine

## 2022-10-08 DIAGNOSIS — E1169 Type 2 diabetes mellitus with other specified complication: Secondary | ICD-10-CM

## 2022-10-08 MED ORDER — SEMAGLUTIDE (1 MG/DOSE) 4 MG/3ML ~~LOC~~ SOPN: 1.0000 mg | PEN_INJECTOR | SUBCUTANEOUS | 2 refills | Status: DC

## 2022-10-08 NOTE — Addendum Note (Signed)
Addended by: Darreld Mclean on: 10/08/2022 08:57 PM   Modules accepted: Orders

## 2022-10-13 ENCOUNTER — Telehealth: Payer: Self-pay | Admitting: Family Medicine

## 2022-10-13 NOTE — Telephone Encounter (Signed)
Copied from [place] [id number]. Topic: Medicare AWV >> Oct 13, 2022 11:22 AM Devoria Glassing wrote: Reason for CRM: Called patient to schedule Medicare Annual Wellness Visit (AWV). Left message for patient to call back and schedule Medicare Annual Wellness Visit (AWV).  Last date of AWV: 09/10/21  Please schedule an appointment at any time with Beatris Ship, CMA after 10/22/22 .  If any questions, please contact me.  Thank you ,  Sherol Dade; [place] Direct Dial: [phone]

## 2022-11-19 ENCOUNTER — Telehealth: Payer: Self-pay | Admitting: Family Medicine

## 2022-11-19 NOTE — Telephone Encounter (Signed)
Copied from CRM [id number]. Topic: Medicare AWV >> Nov 19, 2022  9:27 AM Payton Doughty wrote: Reason for CRM: Called patient to schedule Medicare Annual Wellness Visit (AWV). Left message for patient to call back and schedule Medicare Annual Wellness Visit (AWV).  Last date of AWV: 07/26/21  Please schedule an appointment at any time with Donne Anon, CMA  .  If any questions, please contact me.  Thank you ,  Verlee Rossetti; Care Guide Ambulatory Clinical Support [place] [facility] Direct Dial: [phone]

## 2022-12-22 ENCOUNTER — Telehealth: Payer: Self-pay | Admitting: Pharmacist

## 2022-12-22 ENCOUNTER — Other Ambulatory Visit: Payer: Self-pay | Admitting: Family Medicine

## 2022-12-22 DIAGNOSIS — I10 Essential (primary) hypertension: Secondary | ICD-10-CM

## 2022-12-22 MED ORDER — LISINOPRIL 20 MG PO TABS
ORAL_TABLET | ORAL | 1 refills | Status: DC
Start: 2022-12-22 — End: 2022-12-23

## 2022-12-22 NOTE — Telephone Encounter (Signed)
Pharmacy Quality Measure Review  This patient is appearing on the insurance-provided list for being at risk of failing the adherence measure for diabetes medications this calendar year.   Medication: Ozempic Last fill date: 10/08/2022 for 28 DS  Also noted patient should be due to refill rosuvastatin 20mg  - LR 09/13/2022 for 90 DS and lisinopril 20mg  - LR 08/28/2022 for 90 DS.   Called [facility] to see if patient has filled above med recently - they confirmed fill dates were correct.  Pharmacist did states lisinopril does need updated Rx.   Tried to call patient to discuss adherence and make sure no cost issues.  Unable to reach patient left HIPAA compliant VM on phone with CB# [phone] [phone]    Please consider outreaching patient to discuss adherence, any barriers to adherence, and consider a referral to me for medication management support.

## 2022-12-26 ENCOUNTER — Other Ambulatory Visit: Payer: Self-pay | Admitting: Family Medicine

## 2023-02-06 ENCOUNTER — Encounter: Payer: Self-pay | Admitting: Pharmacist

## 2023-02-06 NOTE — Progress Notes (Signed)
Pharmacy Quality Measure Review  This patient is appearing on a report for being at risk of failing the Glycemic Status Assessment in Diabetes measure this calendar year.    Last documented A1c or GMI 6.0% on 04/16/2022 - patient was taking Mounjaro for about 5 months but was not able to tolerate 7.5mg  Mounjaro. He was switched to Ozempic but I only see that he filled once and there is no mention from him regarding if he tolerated well or not.   He also was on list for low adherence for Statin and blood pressure meds but he has filled them recently Rosuvastatin filled 90 DS 09/17/2022 and 01/21/2023 (81% adherence currently) Lisinopril filled 90 DS 09/17/2022 and 12/23/2022 (82% adherence currently.   Unsuccessful attempt to contact patient to discuss diabetes control and Ozempic. He is due to see PCP for labs and check up soon - last OV was 04/16/2022.  LM on VM to contact office for appt with Dr Patsy Lager.  Provided my CB# [phone]  Henrene Pastor, PharmD Clinical Pharmacist [facility]

## 2023-02-11 ENCOUNTER — Telehealth: Payer: Self-pay

## 2023-02-11 NOTE — Telephone Encounter (Signed)
Reached out to patient to set up follow up visit/CPE with provider to discuss chronic conditions.  Telephone encounter attempt : 1   A HIPAA compliant voice message was left requesting a return call.  Instructed patient to call office or to call me at [phone].  Timothy Solis [facility] Specialist

## 2023-03-19 ENCOUNTER — Encounter: Payer: Self-pay | Admitting: Pharmacist

## 2023-03-19 NOTE — Progress Notes (Signed)
Pharmacy Quality Measure Review  This patient is appearing on a report for being at risk of failing the Glycemic Status Assessment in Diabetes measure this calendar year. He also is due to have a follow up appointment with PCP. Myself and CMA with population health have attempted to outreach patient several times over the last 2 months but we have not been successful in reaching Mr. Timothy Solis.   Last documented A1c or GMI 6.0% on 04/16/2022 - patient was taking Mounjaro for about 5 months but was not able to tolerate 7.5mg  Mounjaro. He was switched to Ozempic but I only see that he filled once.  He also was on list for low adherence for Statin and blood pressure meds but he has filled them recently Rosuvastatin filled 90 DS 09/17/2022 and 01/21/2023  Lisinopril filled 90 DS 09/17/2022 and 12/23/2022. He will be due to have refilled soon.    Unsuccessful attempt to contact patient to discuss appointment with PCP, medication adherence.  LM on VM to contact office for appt with Dr Patsy Lager.  Provided my CB# [phone] and main office number [phone]  I also tried to reach his emergency contact Lynnell Jude. LM on VM requesting office visit and left main office number to call for appt [phone]  Henrene Pastor, PharmD Clinical Pharmacist [facility]  Population Health

## 2023-03-24 NOTE — Patient Instructions (Incomplete)
It was good to see you again today, I will be in touch with your blood work

## 2023-03-24 NOTE — Progress Notes (Addendum)
[facility] at [facility] [address] [place], [place] [address] [phone]  Date:  03/26/2023   Name:  Timothy Solis   DOB:  10/31/1965   MRN:  [id number]  PCP:  Pearline Cables, MD    Chief Complaint: Follow-up (Concerns/ questions: Ozempic started making him sick. Pt would like to see if he needs an increase in antidepresant. Larose Kells exam: none recently/Shingles: none yet/)   History of Present Illness:  Timothy Solis is a 57 y.o. very pleasant male patient who presents with the following:  Patient seen today for periodic follow-up -history of diet controlled diabetes with neuropathy, OSA, obesity, chronic fatigue/encephalomyelitis myalgic encephalomyelitis, dyslipidemia, limited mobility, B12 and vitamin D deficiency   Most recent visit with myself was in November: At that time he was following a keto diet as well as using Ozempic and had lost quite a bit of weight He stopped ozempic as it caused nausea He has gained back the weight he lost unfortunately  Wt Readings from Last 3 Encounters:  03/26/23 (!) 400 lb 6.4 oz (181.6 kg)  06/02/22 (!) 348 lb 7.2 oz (158.1 kg)  04/23/22 (!) 355 lb (161 kg)    Lab Results  Component Value Date   HGBA1C 6.0 04/16/2022   Can update foot exam Needs urine micro Eye exam- recommended  Flu shot, COVID booster Colon cancer screening-he had a positive Cologuard in 2019 but did not follow-up yet.  I have encouraged him several times to see a gastroenterologist but this has not happened as of yet I most recently placed a GI referral September of last year-   He does think he may have more depression-he notes he feels less motivated to do things.  Family has also commented that he seems a bit more down.  No suicidal ideation He is using venlafaxine 75, he would be open to increasing dose 250 mg  He did have to use his inahler not long ago due to an asthma attack- sx responded normaly and  went away   He is taking B12 and vitamin D daily  Patient Active Problem List   Diagnosis Date Noted   Primary osteoarthritis of right knee 10/24/2021   Arthritis 08/14/2021   B12 deficiency 04/02/2021   Synovitis of right knee 04/02/2021   Supraventricular tachycardia 08/21/2020   Obesity    Nephrolithiasis    IBS (irritable bowel syndrome)    Chronic fatigue syndrome    Allergy    Atypical chest pain 07/16/2020   Diabetes, polyneuropathy (HCC) 12/14/2018   OSA on CPAP 12/14/2018   Paresthesia 12/14/2018   Diabetes mellitus type 2 in obese 07/20/2018   Costochondritis 09/14/2017   Dyslipidemia 08/30/2017   Major depressive disorder with single episode, in remission (HCC) 11/05/2015   Chronic fatigue 11/05/2015   Fibromyalgia 06/20/2015   Allergic rhinitis 04/01/2013   Asthma 04/01/2013   Morbid obesity (HCC) 08/20/2012   Depression 08/20/2012   Gastroesophageal reflux disease 08/20/2012   Hypertension 08/20/2012    Past Medical History:  Diagnosis Date   Allergic rhinitis 04/01/2013   Allergy    Anxiety    Arthritis    Asthma 04/01/2013   [facility] Tenny Craw Spirometry done 64% predicted mild reduction/ no obstruction    Atypical chest pain 07/16/2020   Chronic fatigue 11/05/2015   Chronic fatigue syndrome    Costochondritis 09/14/2017   Depression    Diabetes mellitus type 2 in  obese (HCC) 07/20/2018   Diabetes, polyneuropathy (HCC) 12/14/2018   Dyslipidemia 08/30/2017   Fibromyalgia    MECFS   Gastroesophageal reflux disease 08/20/2012   GERD (gastroesophageal reflux disease)    HTN (hypertension)    Hyperlipidemia    Hypertension 08/20/2012   IBS (irritable bowel syndrome)    IBS (irritable bowel syndrome)    Irritable bowel syndrome 08/20/2012   Lumbar disc disease with radiculopathy 06/20/2015   Formatting of this note might be different from the original. L4-5 Disc bulge with left sided pressure on nerve.  No disc rupture  Last  Assessment & Plan:  Formatting of this note might be different from the original. Relevant Hx: Course: Daily Update: Today's Plan:  Electronically signed by: Iven Finn II, PA-C 06/20/15 1459   Major depressive disorder with single episode, in remission (HCC) 11/05/2015   ME (myalgic encephalomyelitis)    2016   Morbid obesity (HCC) 08/20/2012   Nephrolithiasis    Obesity    OSA on CPAP 12/14/2018   Paresthesia 12/14/2018   Primary osteoarthritis of right knee 10/24/2021   Sleep apnea    WEARS CPAP   Supraventricular tachycardia 08/21/2020   Synovitis of right knee 04/02/2021    Past Surgical History:  Procedure Laterality Date   BACK SURGERY     kidney stone removal     KNEE SURGERY Bilateral    MOUTH SURGERY     SHOULDER SURGERY Left     Social History   Tobacco Use   Smoking status: Former    Types: Cigars    Quit date: 04/20/2020    Years since quitting: 2.9    Passive exposure: Never   Smokeless tobacco: Never  Vaping Use   Vaping status: Never Used  Substance Use Topics   Alcohol use: Not Currently   Drug use: Never    Family History  Problem Relation Age of Onset   Heart disease Mother    Multiple sclerosis Father    Cancer Brother    Multiple sclerosis Brother    Multiple sclerosis Brother    Multiple sclerosis Maternal Uncle    Heart disease Maternal Grandmother    Cancer Maternal Grandfather    Coronary artery disease Other    Cancer Other    Colon cancer Neg Hx    Esophageal cancer Neg Hx    Stomach cancer Neg Hx    Rectal cancer Neg Hx    Colon polyps Neg Hx     Allergies  Allergen Reactions   Ceclor [Cefaclor]     Can't remember what reaction had   Cyclobenzaprine Other (See Comments)    Serotonin syndrome per pt   Gluten Meal     Triggers allergy attack    Oxycodone Itching   Prednisone     cramping and sickness symptoms- just doesn't want a large dose    Medication list has been reviewed and updated.  Current Outpatient  Medications on File Prior to Visit  Medication Sig Dispense Refill   albuterol (VENTOLIN HFA) 108 (90 Base) MCG/ACT inhaler Inhale 2 puffs into the lungs every 6 (six) hours as needed for wheezing or shortness of breath.     amoxicillin (AMOXIL) 500 MG capsule Take 2 capsules (1,000 mg total) by mouth 2 (two) times daily. 40 capsule 0   ASPERCREME LIDOCAINE EX Apply 1 application topically daily as needed (pain).     bismuth subsalicylate (PEPTO BISMOL) 262 MG/15ML suspension Take 30 mLs by mouth every 6 (six) hours as  needed for indigestion.     diazepam (VALIUM) 5 MG tablet TAKE 1 TABLET BY MOUTH EVERY 8 HOURS AS NEEDED FOR ANXIETY 30 tablet 1   dicyclomine (BENTYL) 20 MG tablet Take 1 tablet (20 mg total) by mouth 2 (two) times daily. 180 tablet 1   dimenhyDRINATE (DRAMAMINE PO) Take 1 tablet by mouth as needed (nausea).     diphenhydrAMINE (BENADRYL) 25 MG tablet Take 25 mg by mouth every 6 (six) hours as needed for allergies.     ibuprofen (ADVIL) 200 MG tablet Take 400-800 mg by mouth every 6 (six) hours as needed for moderate pain.     lisinopril (ZESTRIL) 20 MG tablet TAKE 1 TABLET BY MOUTH DAILY 90 tablet 1   loperamide (IMODIUM A-D) 2 MG tablet Take 2-4 mg by mouth 4 (four) times daily as needed for diarrhea or loose stools.     metoprolol tartrate (LOPRESSOR) 25 MG tablet Take 1 tablet (25 mg total) by mouth 2 (two) times daily. 60 tablet 0   nitroGLYCERIN (NITROSTAT) 0.4 MG SL tablet Place 0.4 mg under the tongue every 5 (five) minutes as needed for chest pain.     Omega-3 Fatty Acids (FISH OIL) 1000 MG CAPS Take 2,000 mg by mouth daily.     OVER THE COUNTER MEDICATION Take 30 mg by mouth at bedtime. CBD softgels     pantoprazole (PROTONIX) 20 MG tablet Take 1 tablet (20 mg total) by mouth daily. 90 tablet 1   phenylephrine (SUDAFED PE) 10 MG TABS tablet Take 10 mg by mouth every 4 (four) hours as needed (congestion).     rosuvastatin (CRESTOR) 20 MG tablet Take 1 tablet (20 mg total)  by mouth daily. 90 tablet 1   vitamin B-12 (CYANOCOBALAMIN) 1000 MCG tablet Take 1,000 mcg by mouth daily.     No current facility-administered medications on file prior to visit.    Review of Systems:  As per HPI- otherwise negative.   Physical Examination: Vitals:   03/26/23 1536  BP: 138/78  Pulse: 78  Resp: 18  Temp: 98.2 F (36.8 C)  SpO2: 98%   Vitals:   03/26/23 1536  Weight: (!) 400 lb 6.4 oz (181.6 kg)   Body mass index is 57.45 kg/m. Ideal Body Weight:    GEN: no acute distress.  Obese, looks well  HEENT: Atraumatic, Normocephalic.  metastatic Ears and Nose: No external deformity. CV: RRR, No M/G/R. No JVD. No thrill. No extra heart sounds. PULM: CTA B, no wheezes, crackles, rhonchi. No retractions. No resp. distress. No accessory muscle use. EXTR: No c/c/e PSYCH: Normally interactive. Conversant.  Foot exam- normal except limited sensation in the right big toe, may be due to callus  Assessment and Plan: Irritable bowel syndrome, unspecified type  Morbid obesity (HCC)  Vitamin D deficiency - Plan: VITAMIN D 25 Hydroxy (Vit-D Deficiency, Fractures)  B12 deficiency - Plan: Vitamin B12  Essential hypertension - Plan: CBC, Comprehensive metabolic panel  Dyslipidemia - Plan: Lipid panel, rosuvastatin (CRESTOR) 20 MG tablet  Screening for prostate cancer - Plan: PSA  Positive colorectal cancer screening using Cologuard test  Diabetic polyneuropathy associated with type 2 diabetes mellitus (HCC) - Plan: Hemoglobin A1c, Microalbumin / creatinine urine ratio  Major depressive disorder with single episode, in remission (HCC) - Plan: venlafaxine XR (EFFEXOR-XR) 150 MG 24 hr capsule  Immunization due - Plan: Zoster Recombinant (Shingrix ), Flu vaccine trivalent PF, 6mos and older(Flulaval,Afluria,Fluarix,Fluzone)  Patient seen today for follow-up.  Lab work is pending as  above.  Blood pressure under good control Increase his dose of venlafaxine 250 mg, he  will let me know how this works for him Gave flu shot and first dose of Shingrix today Encouraged him to follow-up with GI for positive Cologuard Will plan further follow- up pending labs.  Signed Abbe Amsterdam, MD  Addendum 9/6, received labs as below.  Message to patient  Results for orders placed or performed in visit on 03/26/23  CBC  Result Value Ref Range   WBC 7.7 4.0 - 10.5 K/uL   RBC 5.17 4.22 - 5.81 Mil/uL   Platelets 254.0 150.0 - 400.0 K/uL   Hemoglobin 14.6 13.0 - 17.0 g/dL   HCT 21.3 08.6 - 57.8 %   MCV 86.3 78.0 - 100.0 fl   MCHC 32.7 30.0 - 36.0 g/dL   RDW 46.9 62.9 - 52.8 %  Comprehensive metabolic panel  Result Value Ref Range   Sodium 139 135 - 145 mEq/L   Potassium 4.7 3.5 - 5.1 mEq/L   Chloride 103 96 - 112 mEq/L   CO2 29 19 - 32 mEq/L   Glucose, Bld 119 (H) 70 - 99 mg/dL   BUN 20 6 - 23 mg/dL   Creatinine, Ser 4.13 0.40 - 1.50 mg/dL   Total Bilirubin 0.4 0.2 - 1.2 mg/dL   Alkaline Phosphatase 70 39 - 117 U/L   AST 17 0 - 37 U/L   ALT 16 0 - 53 U/L   Total Protein 6.8 6.0 - 8.3 g/dL   Albumin 3.8 3.5 - 5.2 g/dL   GFR 24.40 >10.27 mL/min   Calcium 9.3 8.4 - 10.5 mg/dL  Hemoglobin O5D  Result Value Ref Range   Hgb A1c MFr Bld 6.6 (H) 4.6 - 6.5 %  Lipid panel  Result Value Ref Range   Cholesterol 180 0 - 200 mg/dL   Triglycerides 664.4 (H) 0.0 - 149.0 mg/dL   HDL 03.47 >42.59 mg/dL   VLDL 56.3 (H) 0.0 - 87.5 mg/dL   LDL Cholesterol 77 0 - 99 mg/dL   Total CHOL/HDL Ratio 4    NonHDL 138.49   PSA  Result Value Ref Range   PSA 0.55 0.10 - 4.00 ng/mL  Microalbumin / creatinine urine ratio  Result Value Ref Range   Microalb, Ur <0.7 0.0 - 1.9 mg/dL   Creatinine,U 643.3 mg/dL   Microalb Creat Ratio 0.6 0.0 - 30.0 mg/g  Vitamin B12  Result Value Ref Range   Vitamin B-12 1,449 (H) 211 - 911 pg/mL  VITAMIN D 25 Hydroxy (Vit-D Deficiency, Fractures)  Result Value Ref Range   VITD 25.81 (L) 30.00 - 100.00 ng/mL

## 2023-03-26 ENCOUNTER — Encounter: Payer: Self-pay | Admitting: Family Medicine

## 2023-03-26 ENCOUNTER — Ambulatory Visit (INDEPENDENT_AMBULATORY_CARE_PROVIDER_SITE_OTHER): Payer: Medicare (Managed Care) | Admitting: Family Medicine

## 2023-03-26 VITALS — BP 138/78 | HR 78 | Temp 98.2°F | Resp 18 | Wt >= 6400 oz

## 2023-03-26 DIAGNOSIS — E559 Vitamin D deficiency, unspecified: Secondary | ICD-10-CM

## 2023-03-26 DIAGNOSIS — E1142 Type 2 diabetes mellitus with diabetic polyneuropathy: Secondary | ICD-10-CM | POA: Diagnosis not present

## 2023-03-26 DIAGNOSIS — Z23 Encounter for immunization: Secondary | ICD-10-CM

## 2023-03-26 DIAGNOSIS — F325 Major depressive disorder, single episode, in full remission: Secondary | ICD-10-CM | POA: Diagnosis not present

## 2023-03-26 DIAGNOSIS — I10 Essential (primary) hypertension: Secondary | ICD-10-CM

## 2023-03-26 DIAGNOSIS — E538 Deficiency of other specified B group vitamins: Secondary | ICD-10-CM | POA: Diagnosis not present

## 2023-03-26 DIAGNOSIS — E785 Hyperlipidemia, unspecified: Secondary | ICD-10-CM

## 2023-03-26 DIAGNOSIS — K589 Irritable bowel syndrome without diarrhea: Secondary | ICD-10-CM

## 2023-03-26 DIAGNOSIS — Z125 Encounter for screening for malignant neoplasm of prostate: Secondary | ICD-10-CM | POA: Diagnosis not present

## 2023-03-26 DIAGNOSIS — R195 Other fecal abnormalities: Secondary | ICD-10-CM | POA: Diagnosis not present

## 2023-03-26 MED ORDER — VENLAFAXINE HCL ER 150 MG PO CP24
150.0000 mg | ORAL_CAPSULE | Freq: Every day | ORAL | 3 refills | Status: DC
Start: 2023-03-26 — End: 2023-03-30

## 2023-03-26 MED ORDER — ROSUVASTATIN CALCIUM 20 MG PO TABS
20.0000 mg | ORAL_TABLET | Freq: Every day | ORAL | 1 refills | Status: DC
Start: 2023-03-26 — End: 2023-07-17

## 2023-03-27 ENCOUNTER — Encounter: Payer: Self-pay | Admitting: Family Medicine

## 2023-03-27 DIAGNOSIS — F325 Major depressive disorder, single episode, in full remission: Secondary | ICD-10-CM

## 2023-03-27 LAB — CBC
HCT: 44.6 % (ref 39.0–52.0)
Hemoglobin: 14.6 g/dL (ref 13.0–17.0)
MCHC: 32.7 g/dL (ref 30.0–36.0)
MCV: 86.3 fl (ref 78.0–100.0)
Platelets: 254 10*3/uL (ref 150.0–400.0)
RBC: 5.17 Mil/uL (ref 4.22–5.81)
RDW: 13.6 % (ref 11.5–15.5)
WBC: 7.7 10*3/uL (ref 4.0–10.5)

## 2023-03-27 LAB — COMPREHENSIVE METABOLIC PANEL
ALT: 16 U/L (ref 0–53)
AST: 17 U/L (ref 0–37)
Albumin: 3.8 g/dL (ref 3.5–5.2)
Alkaline Phosphatase: 70 U/L (ref 39–117)
BUN: 20 mg/dL (ref 6–23)
CO2: 29 meq/L (ref 19–32)
Calcium: 9.3 mg/dL (ref 8.4–10.5)
Chloride: 103 meq/L (ref 96–112)
Creatinine, Ser: 1.12 mg/dL (ref 0.40–1.50)
GFR: 74.16 mL/min (ref >60.00)
Glucose, Bld: 119 mg/dL — ABNORMAL HIGH (ref 70–99)
Potassium: 4.7 meq/L (ref 3.5–5.1)
Sodium: 139 meq/L (ref 135–145)
Total Bilirubin: 0.4 mg/dL (ref 0.2–1.2)
Total Protein: 6.8 g/dL (ref 6.0–8.3)

## 2023-03-27 LAB — HEMOGLOBIN A1C: Hgb A1c MFr Bld: 6.6 % — ABNORMAL HIGH (ref 4.6–6.5)

## 2023-03-27 LAB — PSA: PSA: 0.55 ng/mL (ref 0.10–4.00)

## 2023-03-27 LAB — MICROALBUMIN / CREATININE URINE RATIO
Creatinine,U: 124 mg/dL
Microalb Creat Ratio: 0.6 mg/g (ref 0.0–30.0)
Microalb, Ur: <0.7 mg/dL (ref 0.0–1.9)

## 2023-03-27 LAB — LIPID PANEL
Cholesterol: 180 mg/dL (ref 0–200)
HDL: 41.3 mg/dL (ref >39.00)
LDL Cholesterol: 77 mg/dL (ref 0–99)
NonHDL: 138.49
Total CHOL/HDL Ratio: 4
Triglycerides: 306 mg/dL — ABNORMAL HIGH (ref 0.0–149.0)
VLDL: 61.2 mg/dL — ABNORMAL HIGH (ref 0.0–40.0)

## 2023-03-27 LAB — VITAMIN B12: Vitamin B-12: 1449 pg/mL — ABNORMAL HIGH (ref 211–911)

## 2023-03-27 LAB — VITAMIN D 25 HYDROXY (VIT D DEFICIENCY, FRACTURES): VITD: 25.81 ng/mL — ABNORMAL LOW (ref 30.00–100.00)

## 2023-03-30 MED ORDER — VENLAFAXINE HCL ER 150 MG PO CP24: 150.0000 mg | ORAL_CAPSULE | Freq: Every day | ORAL | 3 refills | Status: DC

## 2023-04-03 ENCOUNTER — Encounter: Payer: Self-pay | Admitting: Family Medicine

## 2023-05-25 ENCOUNTER — Other Ambulatory Visit: Payer: Self-pay | Admitting: Family Medicine

## 2023-05-25 DIAGNOSIS — K589 Irritable bowel syndrome without diarrhea: Secondary | ICD-10-CM

## 2023-05-26 ENCOUNTER — Encounter (INDEPENDENT_AMBULATORY_CARE_PROVIDER_SITE_OTHER): Payer: Medicare (Managed Care) | Admitting: Family Medicine

## 2023-05-26 DIAGNOSIS — R5382 Chronic fatigue, unspecified: Secondary | ICD-10-CM

## 2023-05-26 NOTE — Telephone Encounter (Signed)

## 2023-07-03 ENCOUNTER — Other Ambulatory Visit: Payer: Self-pay | Admitting: Family Medicine

## 2023-07-03 DIAGNOSIS — F325 Major depressive disorder, single episode, in full remission: Secondary | ICD-10-CM

## 2023-07-16 ENCOUNTER — Other Ambulatory Visit: Payer: Self-pay | Admitting: Family Medicine

## 2023-07-16 DIAGNOSIS — E785 Hyperlipidemia, unspecified: Secondary | ICD-10-CM

## 2023-07-16 DIAGNOSIS — I10 Essential (primary) hypertension: Secondary | ICD-10-CM

## 2023-08-07 ENCOUNTER — Other Ambulatory Visit: Payer: Self-pay | Admitting: Family Medicine

## 2023-08-07 DIAGNOSIS — F325 Major depressive disorder, single episode, in full remission: Secondary | ICD-10-CM

## 2023-08-10 NOTE — Progress Notes (Deleted)
[facility] at [facility] [address], [address] [place], [place] [address] [phone]  Date:  08/12/2023   Name:  Timothy Solis   DOB:  08-25-1965   MRN:  [id number]  PCP:  Pearline Cables, MD    Chief Complaint: No chief complaint on file.   History of Present Illness:  Timothy Solis is a 58 y.o. very pleasant male patient who presents with the following:  Patient seen today to discuss anxiety and concerns about his weight- -history of diet controlled diabetes with neuropathy, OSA, obesity, chronic fatigue/encephalomyelitis myalgic encephalomyelitis, dyslipidemia, limited mobility, B12 and vitamin D deficiency  Most recent visit with myself was in September.  That time he had unfortunately gained back the approximately 50 pounds he had lost using Ozempic, he stopped Ozempic due to nausea  I have tried to set him up with GI for follow-up positive Cologuard, most recently I placed a referral in September 2023 ?  Has patient moveed forward with this  Recommend COVID booster if not done Eye exam Second dose Shingrix can be given  Lab Results  Component Value Date   HGBA1C 6.6 (H) 03/26/2023    Patient Active Problem List   Diagnosis Date Noted   Primary osteoarthritis of right knee 10/24/2021   Arthritis 08/14/2021   B12 deficiency 04/02/2021   Synovitis of right knee 04/02/2021   Supraventricular tachycardia (HCC) 08/21/2020   Obesity    Nephrolithiasis    IBS (irritable bowel syndrome)    Chronic fatigue syndrome    Allergy    Atypical chest pain 07/16/2020   Diabetes, polyneuropathy (HCC) 12/14/2018   OSA on CPAP 12/14/2018   Paresthesia 12/14/2018   Type 2 diabetes mellitus with obesity (HCC) 07/20/2018   Costochondritis 09/14/2017   Dyslipidemia 08/30/2017   Major depressive disorder with single episode, in remission (HCC) 11/05/2015   Chronic fatigue 11/05/2015   Fibromyalgia 06/20/2015   Allergic rhinitis  04/01/2013   Asthma 04/01/2013   Morbid obesity (HCC) 08/20/2012   Depression 08/20/2012   Gastroesophageal reflux disease 08/20/2012   Hypertension 08/20/2012    Past Medical History:  Diagnosis Date   Allergic rhinitis 04/01/2013   Allergy    Anxiety    Arthritis    Asthma 04/01/2013   [facility] Tenny Craw Spirometry done 64% predicted mild reduction/ no obstruction    Atypical chest pain 07/16/2020   Chronic fatigue 11/05/2015   Chronic fatigue syndrome    Costochondritis 09/14/2017   Depression    Diabetes mellitus type 2 in obese (HCC) 07/20/2018   Diabetes, polyneuropathy (HCC) 12/14/2018   Dyslipidemia 08/30/2017   Fibromyalgia    MECFS   Gastroesophageal reflux disease 08/20/2012   GERD (gastroesophageal reflux disease)    HTN (hypertension)    Hyperlipidemia    Hypertension 08/20/2012   IBS (irritable bowel syndrome)    IBS (irritable bowel syndrome)    Irritable bowel syndrome 08/20/2012   Lumbar disc disease with radiculopathy 06/20/2015   Formatting of this note might be different from the original. L4-5 Disc bulge with left sided pressure on nerve.  No disc rupture  Last Assessment & Plan:  Formatting of this note might be different from the original. Relevant Hx: Course: Daily Update: Today's Plan:  Electronically signed by: Iven Finn II, PA-C 06/20/15 1459   Major depressive disorder with single episode, in remission (HCC) 11/05/2015   ME (myalgic encephalomyelitis)    2016  Morbid obesity (HCC) 08/20/2012   Nephrolithiasis    Obesity    OSA on CPAP 12/14/2018   Paresthesia 12/14/2018   Primary osteoarthritis of right knee 10/24/2021   Sleep apnea    WEARS CPAP   Supraventricular tachycardia 08/21/2020   Synovitis of right knee 04/02/2021    Past Surgical History:  Procedure Laterality Date   BACK SURGERY     kidney stone removal     KNEE SURGERY Bilateral    MOUTH SURGERY     SHOULDER SURGERY Left     Social History    Tobacco Use   Smoking status: Former    Types: Cigars    Quit date: 04/20/2020    Years since quitting: 3.3    Passive exposure: Never   Smokeless tobacco: Never  Vaping Use   Vaping status: Never Used  Substance Use Topics   Alcohol use: Not Currently   Drug use: Never    Family History  Problem Relation Age of Onset   Heart disease Mother    Multiple sclerosis Father    Cancer Brother    Multiple sclerosis Brother    Multiple sclerosis Brother    Multiple sclerosis Maternal Uncle    Heart disease Maternal Grandmother    Cancer Maternal Grandfather    Coronary artery disease Other    Cancer Other    Colon cancer Neg Hx    Esophageal cancer Neg Hx    Stomach cancer Neg Hx    Rectal cancer Neg Hx    Colon polyps Neg Hx     Allergies  Allergen Reactions   Ceclor [Cefaclor]     Can't remember what reaction had   Cyclobenzaprine Other (See Comments)    Serotonin syndrome per pt   Gluten Meal     Triggers allergy attack    Oxycodone Itching   Prednisone     cramping and sickness symptoms- just doesn't want a large dose    Medication list has been reviewed and updated.  Current Outpatient Medications on File Prior to Visit  Medication Sig Dispense Refill   albuterol (VENTOLIN HFA) 108 (90 Base) MCG/ACT inhaler Inhale 2 puffs into the lungs every 6 (six) hours as needed for wheezing or shortness of breath.     amoxicillin (AMOXIL) 500 MG capsule Take 2 capsules (1,000 mg total) by mouth 2 (two) times daily. 40 capsule 0   ASPERCREME LIDOCAINE EX Apply 1 application topically daily as needed (pain).     bismuth subsalicylate (PEPTO BISMOL) 262 MG/15ML suspension Take 30 mLs by mouth every 6 (six) hours as needed for indigestion.     diazepam (VALIUM) 5 MG tablet TAKE ONE TABLET BY MOUTH EVERY 8 HOURS AS NEEDED FOR ANXIETY 30 tablet 1   dicyclomine (BENTYL) 20 MG tablet Take 1 tablet (20 mg total) by mouth in the morning and at bedtime. 180 tablet 1   dimenhyDRINATE  (DRAMAMINE PO) Take 1 tablet by mouth as needed (nausea).     diphenhydrAMINE (BENADRYL) 25 MG tablet Take 25 mg by mouth every 6 (six) hours as needed for allergies.     ibuprofen (ADVIL) 200 MG tablet Take 400-800 mg by mouth every 6 (six) hours as needed for moderate pain.     lisinopril (ZESTRIL) 20 MG tablet TAKE 1 TABLET BY MOUTH DAILY 90 tablet 1   loperamide (IMODIUM A-D) 2 MG tablet Take 2-4 mg by mouth 4 (four) times daily as needed for diarrhea or loose stools.     metoprolol  tartrate (LOPRESSOR) 25 MG tablet Take 1 tablet (25 mg total) by mouth 2 (two) times daily. 180 tablet 1   nitroGLYCERIN (NITROSTAT) 0.4 MG SL tablet Place 0.4 mg under the tongue every 5 (five) minutes as needed for chest pain.     Omega-3 Fatty Acids (FISH OIL) 1000 MG CAPS Take 2,000 mg by mouth daily.     OVER THE COUNTER MEDICATION Take 30 mg by mouth at bedtime. CBD softgels     pantoprazole (PROTONIX) 20 MG tablet TAKE ONE (1) TABLET BY MOUTH EVERY DAY 90 tablet 1   phenylephrine (SUDAFED PE) 10 MG TABS tablet Take 10 mg by mouth every 4 (four) hours as needed (congestion).     rosuvastatin (CRESTOR) 20 MG tablet TAKE ONE (1) TABLET BY MOUTH EACH DAY 90 tablet 1   venlafaxine XR (EFFEXOR-XR) 150 MG 24 hr capsule Take 1 capsule (150 mg total) by mouth daily with breakfast. 90 capsule 3   vitamin B-12 (CYANOCOBALAMIN) 1000 MCG tablet Take 1,000 mcg by mouth daily.     No current facility-administered medications on file prior to visit.    Review of Systems:  As per HPI- otherwise negative.   Physical Examination: There were no vitals filed for this visit. There were no vitals filed for this visit. There is no height or weight on file to calculate BMI. Ideal Body Weight:    GEN: no acute distress. HEENT: Atraumatic, Normocephalic.  Ears and Nose: No external deformity. CV: RRR, No M/G/R. No JVD. No thrill. No extra heart sounds. PULM: CTA B, no wheezes, crackles, rhonchi. No retractions. No resp.  distress. No accessory muscle use. ABD: S, NT, ND, +BS. No rebound. No HSM. EXTR: No c/c/e PSYCH: Normally interactive. Conversant.    Assessment and Plan: ***  Signed Abbe Amsterdam, MD

## 2023-08-12 ENCOUNTER — Ambulatory Visit: Payer: Medicare (Managed Care) | Admitting: Family Medicine

## 2023-09-01 NOTE — Progress Notes (Unsigned)
[facility] [address], [address] [place], [place] [address] [phone]  Date:  09/02/2023   Name:  Timothy Solis   DOB:  January 23, 1966   MRN:  [id number]  PCP:  Pearline Cables, MD    Chief Complaint: No chief complaint on file.   History of Present Illness:  Timothy Solis is a 58 y.o. very pleasant male patient who presents with the following:  Patient seen today to discuss mental health- -history of diet controlled diabetes with neuropathy, OSA, obesity, chronic fatigue/encephalomyelitis myalgic encephalomyelitis, dyslipidemia, limited mobility, B12 and vitamin D deficiency   Most recent visit with myself was in September-at that time he was feeling more down and depressed, we increase his dose of venlafaxine  Can give second dose of Shingrix Flu vaccine Lab Results  Component Value Date   HGBA1C 6.6 (H) 03/26/2023     Patient Active Problem List   Diagnosis Date Noted   Primary osteoarthritis of right knee 10/24/2021   Arthritis 08/14/2021   B12 deficiency 04/02/2021   Synovitis of right knee 04/02/2021   Supraventricular tachycardia (HCC) 08/21/2020   Obesity    Nephrolithiasis    IBS (irritable bowel syndrome)    Chronic fatigue syndrome    Allergy    Atypical chest pain 07/16/2020   Diabetes, polyneuropathy (HCC) 12/14/2018   OSA on CPAP 12/14/2018   Paresthesia 12/14/2018   Type 2 diabetes mellitus with obesity (HCC) 07/20/2018   Costochondritis 09/14/2017   Dyslipidemia 08/30/2017   Major depressive disorder with single episode, in remission (HCC) 11/05/2015   Chronic fatigue 11/05/2015   Fibromyalgia 06/20/2015   Allergic rhinitis 04/01/2013   Asthma 04/01/2013   Morbid obesity (HCC) 08/20/2012   Depression 08/20/2012   Gastroesophageal reflux disease 08/20/2012   Hypertension 08/20/2012    Past Medical History:  Diagnosis Date   Allergic rhinitis 04/01/2013   Allergy    Anxiety     Arthritis    Asthma 04/01/2013   [facility] Dr Tenny Craw Spirometry done 64% predicted mild reduction/ no obstruction    Atypical chest pain 07/16/2020   Chronic fatigue 11/05/2015   Chronic fatigue syndrome    Costochondritis 09/14/2017   Depression    Diabetes mellitus type 2 in obese 07/20/2018   Diabetes, polyneuropathy (HCC) 12/14/2018   Dyslipidemia 08/30/2017   Fibromyalgia    MECFS   Gastroesophageal reflux disease 08/20/2012   GERD (gastroesophageal reflux disease)    HTN (hypertension)    Hyperlipidemia    Hypertension 08/20/2012   IBS (irritable bowel syndrome)    IBS (irritable bowel syndrome)    Irritable bowel syndrome 08/20/2012   Lumbar disc disease with radiculopathy 06/20/2015   Formatting of this note might be different from the original. L4-5 Disc bulge with left sided pressure on nerve.  No disc rupture  Last Assessment & Plan:  Formatting of this note might be different from the original. Relevant Hx: Course: Daily Update: Today's Plan:  Electronically signed by: Iven Finn II, PA-C 06/20/15 1459   Major depressive disorder with single episode, in remission (HCC) 11/05/2015   ME (myalgic encephalomyelitis)    2016   Morbid obesity (HCC) 08/20/2012   Nephrolithiasis    Obesity    OSA on CPAP 12/14/2018   Paresthesia 12/14/2018   Primary osteoarthritis of right knee 10/24/2021   Sleep apnea    WEARS CPAP   Supraventricular tachycardia (HCC) 08/21/2020   Synovitis of  right knee 04/02/2021    Past Surgical History:  Procedure Laterality Date   BACK SURGERY     kidney stone removal     KNEE SURGERY Bilateral    MOUTH SURGERY     SHOULDER SURGERY Left     Social History   Tobacco Use   Smoking status: Former    Types: Cigars    Quit date: 04/20/2020    Years since quitting: 3.3    Passive exposure: Never   Smokeless tobacco: Never  Vaping Use   Vaping status: Never Used  Substance Use Topics   Alcohol use: Not Currently    Drug use: Never    Family History  Problem Relation Age of Onset   Heart disease Mother    Multiple sclerosis Father    Cancer Brother    Multiple sclerosis Brother    Multiple sclerosis Brother    Multiple sclerosis Maternal Uncle    Heart disease Maternal Grandmother    Cancer Maternal Grandfather    Coronary artery disease Other    Cancer Other    Colon cancer Neg Hx    Esophageal cancer Neg Hx    Stomach cancer Neg Hx    Rectal cancer Neg Hx    Colon polyps Neg Hx     Allergies  Allergen Reactions   Ceclor [Cefaclor]     Can't remember what reaction had   Cyclobenzaprine Other (See Comments)    Serotonin syndrome per pt   Gluten Meal     Triggers allergy attack    Oxycodone Itching   Prednisone     cramping and sickness symptoms- just doesn't want a large dose    Medication list has been reviewed and updated.  Current Outpatient Medications on File Prior to Visit  Medication Sig Dispense Refill   albuterol (VENTOLIN HFA) 108 (90 Base) MCG/ACT inhaler Inhale 2 puffs into the lungs every 6 (six) hours as needed for wheezing or shortness of breath.     amoxicillin (AMOXIL) 500 MG capsule Take 2 capsules (1,000 mg total) by mouth 2 (two) times daily. 40 capsule 0   ASPERCREME LIDOCAINE EX Apply 1 application topically daily as needed (pain).     bismuth subsalicylate (PEPTO BISMOL) 262 MG/15ML suspension Take 30 mLs by mouth every 6 (six) hours as needed for indigestion.     diazepam (VALIUM) 5 MG tablet TAKE ONE TABLET BY MOUTH EVERY 8 HOURS AS NEEDED FOR ANXIETY 30 tablet 1   dicyclomine (BENTYL) 20 MG tablet Take 1 tablet (20 mg total) by mouth in the morning and at bedtime. 180 tablet 1   dimenhyDRINATE (DRAMAMINE PO) Take 1 tablet by mouth as needed (nausea).     diphenhydrAMINE (BENADRYL) 25 MG tablet Take 25 mg by mouth every 6 (six) hours as needed for allergies.     ibuprofen (ADVIL) 200 MG tablet Take 400-800 mg by mouth every 6 (six) hours as needed for  moderate pain.     lisinopril (ZESTRIL) 20 MG tablet TAKE 1 TABLET BY MOUTH DAILY 90 tablet 1   loperamide (IMODIUM A-D) 2 MG tablet Take 2-4 mg by mouth 4 (four) times daily as needed for diarrhea or loose stools.     metoprolol tartrate (LOPRESSOR) 25 MG tablet Take 1 tablet (25 mg total) by mouth 2 (two) times daily. 180 tablet 1   nitroGLYCERIN (NITROSTAT) 0.4 MG SL tablet Place 0.4 mg under the tongue every 5 (five) minutes as needed for chest pain.     Omega-3  Fatty Acids (FISH OIL) 1000 MG CAPS Take 2,000 mg by mouth daily.     OVER THE COUNTER MEDICATION Take 30 mg by mouth at bedtime. CBD softgels     pantoprazole (PROTONIX) 20 MG tablet TAKE ONE (1) TABLET BY MOUTH EVERY DAY 90 tablet 1   phenylephrine (SUDAFED PE) 10 MG TABS tablet Take 10 mg by mouth every 4 (four) hours as needed (congestion).     rosuvastatin (CRESTOR) 20 MG tablet TAKE ONE (1) TABLET BY MOUTH EACH DAY 90 tablet 1   venlafaxine XR (EFFEXOR-XR) 150 MG 24 hr capsule Take 1 capsule (150 mg total) by mouth daily with breakfast. 90 capsule 3   vitamin B-12 (CYANOCOBALAMIN) 1000 MCG tablet Take 1,000 mcg by mouth daily.     No current facility-administered medications on file prior to visit.    Review of Systems:  ***  Physical Examination: There were no vitals filed for this visit. There were no vitals filed for this visit. There is no height or weight on file to calculate BMI. Ideal Body Weight:    ***  Assessment and Plan: ***  Signed Abbe Amsterdam, MD

## 2023-09-02 ENCOUNTER — Telehealth: Payer: Self-pay

## 2023-09-02 ENCOUNTER — Encounter: Payer: Self-pay | Admitting: Family Medicine

## 2023-09-02 ENCOUNTER — Ambulatory Visit (INDEPENDENT_AMBULATORY_CARE_PROVIDER_SITE_OTHER): Payer: Medicare (Managed Care) | Admitting: Family Medicine

## 2023-09-02 VITALS — BP 130/74 | HR 82 | Temp 98.3°F | Resp 20 | Ht 70.0 in | Wt >= 6400 oz

## 2023-09-02 DIAGNOSIS — G4700 Insomnia, unspecified: Secondary | ICD-10-CM | POA: Diagnosis not present

## 2023-09-02 DIAGNOSIS — F325 Major depressive disorder, single episode, in full remission: Secondary | ICD-10-CM

## 2023-09-02 DIAGNOSIS — Z23 Encounter for immunization: Secondary | ICD-10-CM | POA: Diagnosis not present

## 2023-09-02 MED ORDER — TRAZODONE HCL 50 MG PO TABS: 25.0000 mg | ORAL_TABLET | Freq: Every evening | ORAL | 3 refills | Status: DC | PRN

## 2023-09-02 NOTE — Patient Instructions (Addendum)
Let's try adding trazodone to your regimen for now; watch for any sign of serotonin syndrome  I will be in touch with your Genes[facility] asap   2nd dose shingrix today!

## 2023-09-02 NOTE — Telephone Encounter (Signed)
Pt was seen in office today. [facility] test was collected/ordered. Sample was packaged and placed up front for [employer] to pick up 09/03/23: between 4-5 PM. Consent and insurance info were in the package.   Confirmation number for pickup is [id number]

## 2023-09-07 NOTE — Telephone Encounter (Signed)
Results have been printed.

## 2023-09-09 ENCOUNTER — Encounter: Payer: Self-pay | Admitting: Family Medicine

## 2023-09-09 DIAGNOSIS — F325 Major depressive disorder, single episode, in full remission: Secondary | ICD-10-CM

## 2023-09-09 MED ORDER — DESVENLAFAXINE SUCCINATE ER 50 MG PO TB24: 50.0000 mg | ORAL_TABLET | Freq: Every day | ORAL | 5 refills | Status: DC

## 2023-09-09 MED ORDER — VENLAFAXINE HCL ER 37.5 MG PO CP24: ORAL_CAPSULE | ORAL | 0 refills | Status: DC

## 2023-09-09 NOTE — Addendum Note (Signed)
 Addended by: Abbe Amsterdam C on: 09/09/2023 12:44 PM   Modules accepted: Orders

## 2023-10-05 ENCOUNTER — Telehealth: Payer: Self-pay

## 2023-10-05 DIAGNOSIS — K219 Gastro-esophageal reflux disease without esophagitis: Secondary | ICD-10-CM | POA: Diagnosis not present

## 2023-10-05 DIAGNOSIS — F32A Depression, unspecified: Secondary | ICD-10-CM | POA: Diagnosis not present

## 2023-10-05 DIAGNOSIS — Z87891 Personal history of nicotine dependence: Secondary | ICD-10-CM | POA: Diagnosis not present

## 2023-10-05 DIAGNOSIS — Z79899 Other long term (current) drug therapy: Secondary | ICD-10-CM | POA: Diagnosis not present

## 2023-10-05 DIAGNOSIS — Z87442 Personal history of urinary calculi: Secondary | ICD-10-CM | POA: Diagnosis not present

## 2023-10-05 DIAGNOSIS — Z807 Family history of other malignant neoplasms of lymphoid, hematopoietic and related tissues: Secondary | ICD-10-CM | POA: Diagnosis not present

## 2023-10-05 DIAGNOSIS — K589 Irritable bowel syndrome without diarrhea: Secondary | ICD-10-CM | POA: Diagnosis not present

## 2023-10-05 DIAGNOSIS — Z8661 Personal history of infections of the central nervous system: Secondary | ICD-10-CM | POA: Diagnosis not present

## 2023-10-05 DIAGNOSIS — Z881 Allergy status to other antibiotic agents status: Secondary | ICD-10-CM | POA: Diagnosis not present

## 2023-10-05 DIAGNOSIS — Z888 Allergy status to other drugs, medicaments and biological substances status: Secondary | ICD-10-CM | POA: Diagnosis not present

## 2023-10-05 DIAGNOSIS — R45851 Suicidal ideations: Secondary | ICD-10-CM | POA: Diagnosis not present

## 2023-10-05 DIAGNOSIS — G9332 Myalgic encephalomyelitis/chronic fatigue syndrome: Secondary | ICD-10-CM | POA: Diagnosis not present

## 2023-10-05 DIAGNOSIS — F410 Panic disorder [episodic paroxysmal anxiety] without agoraphobia: Secondary | ICD-10-CM | POA: Diagnosis not present

## 2023-10-05 DIAGNOSIS — F332 Major depressive disorder, recurrent severe without psychotic features: Secondary | ICD-10-CM | POA: Diagnosis not present

## 2023-10-05 DIAGNOSIS — Z885 Allergy status to narcotic agent status: Secondary | ICD-10-CM | POA: Diagnosis not present

## 2023-10-05 DIAGNOSIS — Z820 Family history of epilepsy and other diseases of the nervous system: Secondary | ICD-10-CM | POA: Diagnosis not present

## 2023-10-05 DIAGNOSIS — Z9151 Personal history of suicidal behavior: Secondary | ICD-10-CM | POA: Diagnosis not present

## 2023-10-05 NOTE — Telephone Encounter (Signed)
 Called but did not reach- LMOM and will reply to his mychart

## 2023-10-05 NOTE — Telephone Encounter (Signed)
 Spoke with Dash who confirmed suicidal thoughts and means (handgun, medication). He gave permission for me to call the PD to help him.  Called 911 and gave dispatcher all needed info.  Called back about 10 minutes later- Nellie reports officers are with him

## 2023-10-05 NOTE — Telephone Encounter (Signed)
 I called the pt back and got Dr Patsy Lager on the phone. She spoke to the pt and advised him that he should be admitted into the hospital. She then called 911 to be dispatched to the pts home.    ------------------------------------------------------------ Copied from CRM [id number]. Topic: General - Other >> Oct 05, 2023 12:56 PM Truddie Crumble wrote: Reason for CRM: patient is returning a call to the doctor from the office

## 2023-10-06 DIAGNOSIS — F32A Depression, unspecified: Secondary | ICD-10-CM | POA: Diagnosis not present

## 2023-10-06 DIAGNOSIS — R45851 Suicidal ideations: Secondary | ICD-10-CM | POA: Diagnosis not present

## 2023-10-07 DIAGNOSIS — R45851 Suicidal ideations: Secondary | ICD-10-CM | POA: Diagnosis not present

## 2023-10-08 DIAGNOSIS — R45851 Suicidal ideations: Secondary | ICD-10-CM | POA: Diagnosis not present

## 2023-10-09 DIAGNOSIS — R45851 Suicidal ideations: Secondary | ICD-10-CM | POA: Diagnosis not present

## 2023-10-12 ENCOUNTER — Telehealth: Payer: Self-pay | Admitting: *Deleted

## 2023-10-12 NOTE — Transitions of Care (Post Inpatient/ED Visit) (Signed)
 10/12/2023  Name: Timothy Solis MRN: [id number] DOB: 1965/09/11  Today's TOC FU Call Status: Today's TOC FU Call Status:: Successful TOC FU Call Completed TOC FU Call Complete Date: 10/09/23 Patient's Name and Date of Birth confirmed.  Transition Care Management Follow-up Telephone Call Date of Discharge: 10/09/23 Discharge Facility: Other [facility]) Name of Other (Non[facility]) Discharge Facility: [facility] Type of Discharge: Inpatient Admission Primary Inpatient Discharge Diagnosis:: SI/ HI How have you been since you were released from the hospital?: Better ("I am much better now; I have an action plan and will reach out to Dr. Patsy Lager if I think I need any help.  Not having any  thoughts of suicide and think I am going to be fine.  I have the resources I need if anything changes") Any questions or concerns?: No  Items Reviewed: Did you receive and understand the discharge instructions provided?: No (briefly reviewed with patient who verbalizes good understanding of same - outside hospital AVS) Medications obtained,verified, and reconciled?: Yes (Medications Reviewed) (Full medication reconciliation/ review completed; no concerns or discrepancies identified; confirmed patient obtained/ is taking all newly Rx'd medications as instructed; self-manages medications and denies questions/ concerns around medications today) Any new allergies since your discharge?: No Dietary orders reviewed?: Yes Type of Diet Ordered:: "Headache prevention diet and I avoid gluten; low carb; intermittent fasting diet" Do you have support at home?: Yes People in Home: alone Name of Support/Comfort Primary Source: Reports independent in self-care activities; resides alone; supportive friends and brothers x 2 assists as/ if needed/ indicated  Medications Reviewed Today: Medications Reviewed Today     Reviewed by Michaela Corner, RN (Registered Nurse) on 10/12/23 at 1315  Med List  Status: <None>   Medication Order Taking? Sig Documenting Provider Last Dose Status Informant  albuterol (VENTOLIN HFA) 108 (90 Base) MCG/ACT inhaler [id number] Yes Inhale 2 puffs into the lungs every 6 (six) hours as needed for wheezing or shortness of breath. [provider] Taking Active Self  ASPERCREME LIDOCAINE EX [id number] Yes Apply 1 application topically daily as needed (pain). [provider] Taking Active Self  bismuth subsalicylate (PEPTO BISMOL) 262 MG/15ML suspension [id number] Yes Take 30 mLs by mouth every 6 (six) hours as needed for indigestion. [provider] Taking Active Self  busPIRone (BUSPAR) 10 MG tablet [id number] Yes Take 10 mg by mouth 3 (three) times daily. 10/12/23: Reports during TOC call, this medication was prescribed by hospital discharging provider at [facility]- outpatient pharmacy has called and will be delivering to him today per patient report Copland, Gwenlyn Found, MD Taking Active Self           Med Note Michaela Corner   Mon Oct 12, 2023  1:14 PM) 10/12/23: Reports during [facility] call, this medication was prescribed by hospital discharging provider at [facility]- outpatient pharmacy has called and will be delivering to him today per patient report  desvenlafaxine (PRISTIQ) 50 MG 24 hr tablet [id number] Yes Take 1 tablet (50 mg total) by mouth daily. Copland, Gwenlyn Found, MD Taking Active            Med Note Michaela Corner   Mon Oct 12, 2023  1:15 PM) 10/12/23: Reports during TOC call, this medication was dose was increased by hospital discharging provider at [facility]- reports now taking 75 mg QD  diazepam (VALIUM) 5 MG tablet [id number] Yes TAKE ONE TABLET BY MOUTH EVERY 8 HOURS AS NEEDED FOR ANXIETY Copland, Gwenlyn Found, MD Taking Active  dicyclomine (BENTYL) 20 MG tablet [id number] Yes Take 1 tablet (20 mg total) by mouth in the morning and at bedtime. Copland, Gwenlyn Found, MD Taking Active   dimenhyDRINATE (DRAMAMINE PO) [id number] Yes Take 1 tablet by  mouth as needed (nausea). [provider] Taking Active   diphenhydrAMINE (BENADRYL) 25 MG tablet [id number] Yes Take 25 mg by mouth every 6 (six) hours as needed for allergies. [provider] Taking Active Self  ibuprofen (ADVIL) 200 MG tablet [id number] Yes Take 400-800 mg by mouth every 6 (six) hours as needed for moderate pain. [provider] Taking Active Self  lisinopril (ZESTRIL) 20 MG tablet [id number] Yes TAKE 1 TABLET BY MOUTH DAILY Copland, Gwenlyn Found, MD Taking Active   loperamide (IMODIUM A-D) 2 MG tablet [id number] Yes Take 2-4 mg by mouth 4 (four) times daily as needed for diarrhea or loose stools. [provider] Taking Active Self  metoprolol tartrate (LOPRESSOR) 25 MG tablet [id number] Yes Take 1 tablet (25 mg total) by mouth 2 (two) times daily. Copland, Gwenlyn Found, MD Taking Active   nitroGLYCERIN (NITROSTAT) 0.4 MG SL tablet [id number] Yes Place 0.4 mg under the tongue every 5 (five) minutes as needed for chest pain. [provider] Taking Active Self  OVER THE COUNTER MEDICATION [id number] Yes Take 30 mg by mouth at bedtime. CBD softgels [provider] Taking Active Self  pantoprazole (PROTONIX) 20 MG tablet [id number] Yes TAKE ONE (1) TABLET BY MOUTH EVERY DAY Copland, Gwenlyn Found, MD Taking Active   phenylephrine (SUDAFED PE) 10 MG TABS tablet [id number] Yes Take 10 mg by mouth every 4 (four) hours as needed (congestion). [provider] Taking Active Self  rosuvastatin (CRESTOR) 20 MG tablet [id number] Yes TAKE ONE (1) TABLET BY MOUTH EACH DAY Copland, Gwenlyn Found, MD Taking Active   traZODone (DESYREL) 50 MG tablet [id number] Yes Take 0.5-1 tablets (25-50 mg total) by mouth at bedtime as needed for sleep. Copland, Gwenlyn Found, MD Taking Active   venlafaxine XR (EFFEXOR XR) 37.5 MG 24 hr capsule [id number] No Take 75 mg by mouth daily for 1 week, then 37.5 mg daily for one week, then stop. Use to taper venlefaxine  Patient not  taking: Reported on 10/12/2023   Copland, Gwenlyn Found, MD Not Taking Active            Med Note Michaela Corner   Mon Oct 12, 2023  1:15 PM) 10/12/23: Reports during [facility] call, this medication was discontinued by hospital discharging provider at [facility]- reports now taking "pristiq 75 mg po every day"  vitamin B-12 (CYANOCOBALAMIN) 1000 MCG tablet [id number] Yes Take 1,000 mcg by mouth daily. [provider] Taking Active Self           Home Care and Equipment/Supplies: Were Home Health Services Ordered?: No Any new equipment or medical supplies ordered?: No  Functional Questionnaire: Do you need assistance with bathing/showering or dressing?: No Do you need assistance with meal preparation?: No Do you need assistance with eating?: No Do you have difficulty maintaining continence: No Do you need assistance with getting out of bed/getting out of a chair/moving?: No Do you have difficulty managing or taking your medications?: No  Follow up appointments reviewed: PCP Follow-up appointment confirmed?: Yes Date of PCP follow-up appointment?: 10/15/23 (care coordination outreach in real-time with scheduling care guide to successfully schedule hospital follow up PCP appointment 10/15/23) Follow-up Provider: PCP- [facility] Follow-up appointment confirmed?: No Reason Specialist Follow-Up Not Confirmed: Patient has Specialist Provider  Number and will Call for Appointment Do you need transportation to your follow-up appointment?: No Do you understand care options if your condition(s) worsen?: Yes-patient verbalized understanding  SDOH Interventions Today    Flowsheet Row Most Recent Value  SDOH Interventions   Food Insecurity Interventions Intervention Not Indicated  Housing Interventions Intervention Not Indicated  Transportation Interventions Intervention Not Indicated  [reports uses Pharmacist, community for all transportation needs]  Utilities Interventions Intervention Not  Indicated      Interventions Today    Flowsheet Row Most Recent Value  Chronic Disease   Chronic disease during today's visit Other  [SI/ HI]  General Interventions   General Interventions Discussed/Reviewed Durable Medical Equipment (DME), Doctor Visits, General Interventions Discussed  Doctor Visits Discussed/Reviewed Doctor Visits Discussed, PCP, Specialist  Durable Medical Equipment (DME) Other  [confirmed uses assistive devices on regular basis, at baseline -- cane]  PCP/Specialist Visits Compliance with follow-up visit  Education Interventions   Education Provided Provided Education  Provided Verbal Education On Mental Health/Coping with Illness, When to see the doctor, Insurance Plans, Other  [confirmed patient has resources for suicide hotline,  mental health urgent needs,  psychiatry providers]  Mental Health Interventions   Mental Health Discussed/Reviewed Mental Health Discussed, Suicide, Depression  [confirmed patient has resources for suicide hotline,  mental health urgent needs,  psychiatry providers]  Nutrition Interventions   Nutrition Discussed/Reviewed Nutrition Discussed  Pharmacy Interventions   Pharmacy Dicussed/Reviewed Pharmacy Topics Discussed  [Full medication review with updating medication list in EHR per patient report]  Safety Interventions   Safety Discussed/Reviewed Safety Discussed, Fall Risk  [provided education/ reinforcement around fall prevention]      TOC Interventions Today    Flowsheet Row Most Recent Value  TOC Interventions   TOC Interventions Discussed/Reviewed TOC Interventions Discussed, Arranged PCP follow up within 7 days/Care Guide scheduled  [Patient declines need for ongoing/ further care management/ coordination outreach,  declines enrollment in 30-day TOC program- declines taking my direct phone number should needs/ concerns arise post-TOC call]      Total time spent from review to signing of note/ including any care coordination  interventions:  56 minutes  Pls call/ message for questions,  Caryl Pina, RN, BSN, Media planner  Transitions of Care  [facility]  [place] [phone]: direct office

## 2023-10-14 NOTE — Progress Notes (Unsigned)
 [facility] at [facility] [address] [place], [place] [address] [phone]  Date:  10/15/2023   Name:  Timothy Solis   DOB:  July 27, 1965   MRN:  [id number]  PCP:  Pearline Cables, MD    Chief Complaint: No chief complaint on file.   History of Present Illness:  Timothy Solis is a 58 y.o. very pleasant male patient who presents with the following:  Patient seen today for follow-up.  I last saw him in the clinic in mid February. He contacted [place] on March 17 with suicidal ideation.  We called emergency services who went to his home-he was admitted from March 17 through March 21 through [facility]: Hospital Course:  Patient is a 58 year old male, past medical history of SVT chronic fatigue morbid history BMI 60 OSA on CPAP hypertension depression among others who presented to the hospital due to suicidal homicidal ideation with history of major depression disorder. Patient was initially evaluated by psychiatry who recommended medicine admission due to patient requiring CPAP. During the course of hospitalization patient's antidepressants were titrated and patient remained asymptomatic. Prior to discharge patient denied any suicidal or homicidal ideation. Psychiatry discontinued IVC. At this time patient will be discharged on BuSpar and Pristiq. Patient did have questions about weight loss. Failed DDAVP inhibitors. At this time will recommend following up with bariatric specialist for further details. Patient's other chronic comorbid medical conditions remained stable. Firearms were removed from home as per case management reporting. All questions answered. Post Discharge Follow Up Issues:  PCP in 1 week Psychiatry is scheduled   It looks like BuSpar was added to his regimen    Patient Active Problem List   Diagnosis Date Noted   Primary osteoarthritis of right knee 10/24/2021   Arthritis 08/14/2021   B12 deficiency  04/02/2021   Synovitis of right knee 04/02/2021   Supraventricular tachycardia (HCC) 08/21/2020   Obesity    Nephrolithiasis    IBS (irritable bowel syndrome)    Chronic fatigue syndrome    Allergy    Atypical chest pain 07/16/2020   Diabetes, polyneuropathy (HCC) 12/14/2018   OSA on CPAP 12/14/2018   Paresthesia 12/14/2018   Type 2 diabetes mellitus with obesity (HCC) 07/20/2018   Costochondritis 09/14/2017   Dyslipidemia 08/30/2017   Major depressive disorder with single episode, in remission (HCC) 11/05/2015   Chronic fatigue 11/05/2015   Fibromyalgia 06/20/2015   Allergic rhinitis 04/01/2013   Asthma 04/01/2013   Morbid obesity (HCC) 08/20/2012   Depression 08/20/2012   Gastroesophageal reflux disease 08/20/2012   Hypertension 08/20/2012    Past Medical History:  Diagnosis Date   Allergic rhinitis 04/01/2013   Allergy    Anxiety    Arthritis    Asthma 04/01/2013   [facility] Tenny Craw Spirometry done 64% predicted mild reduction/ no obstruction    Atypical chest pain 07/16/2020   Chronic fatigue 11/05/2015   Chronic fatigue syndrome    Costochondritis 09/14/2017   Depression    Diabetes mellitus type 2 in obese 07/20/2018   Diabetes, polyneuropathy (HCC) 12/14/2018   Dyslipidemia 08/30/2017   Fibromyalgia    MECFS   Gastroesophageal reflux disease 08/20/2012   GERD (gastroesophageal reflux disease)    HTN (hypertension)    Hyperlipidemia    Hypertension 08/20/2012   IBS (irritable bowel syndrome)    IBS (irritable bowel syndrome)    Irritable bowel syndrome 08/20/2012   Lumbar disc disease  with radiculopathy 06/20/2015   Formatting of this note might be different from the original. L4-5 Disc bulge with left sided pressure on nerve.  No disc rupture  Last Assessment & Plan:  Formatting of this note might be different from the original. Relevant Hx: Course: Daily Update: Today's Plan:  Electronically signed by: Iven Finn II, PA-C  06/20/15 1459   Major depressive disorder with single episode, in remission (HCC) 11/05/2015   ME (myalgic encephalomyelitis)    2016   Morbid obesity (HCC) 08/20/2012   Nephrolithiasis    Obesity    OSA on CPAP 12/14/2018   Paresthesia 12/14/2018   Primary osteoarthritis of right knee 10/24/2021   Sleep apnea    WEARS CPAP   Supraventricular tachycardia (HCC) 08/21/2020   Synovitis of right knee 04/02/2021    Past Surgical History:  Procedure Laterality Date   BACK SURGERY     kidney stone removal     KNEE SURGERY Bilateral    MOUTH SURGERY     SHOULDER SURGERY Left     Social History   Tobacco Use   Smoking status: Former    Types: Cigars    Quit date: 04/20/2020    Years since quitting: 3.4    Passive exposure: Never   Smokeless tobacco: Never  Vaping Use   Vaping status: Never Used  Substance Use Topics   Alcohol use: Not Currently   Drug use: Never    Family History  Problem Relation Age of Onset   Heart disease Mother    Multiple sclerosis Father    Cancer Brother    Multiple sclerosis Brother    Multiple sclerosis Brother    Multiple sclerosis Maternal Uncle    Heart disease Maternal Grandmother    Cancer Maternal Grandfather    Coronary artery disease Other    Cancer Other    Colon cancer Neg Hx    Esophageal cancer Neg Hx    Stomach cancer Neg Hx    Rectal cancer Neg Hx    Colon polyps Neg Hx     Allergies  Allergen Reactions   Ceclor [Cefaclor]     Can't remember what reaction had   Cyclobenzaprine Other (See Comments)    Serotonin syndrome per pt   Gluten Meal     Triggers allergy attack    Oxycodone Itching   Prednisone     cramping and sickness symptoms- just doesn't want a large dose    Medication list has been reviewed and updated.  Current Outpatient Medications on File Prior to Visit  Medication Sig Dispense Refill   albuterol (VENTOLIN HFA) 108 (90 Base) MCG/ACT inhaler Inhale 2 puffs into the lungs every 6 (six) hours as  needed for wheezing or shortness of breath.     ASPERCREME LIDOCAINE EX Apply 1 application topically daily as needed (pain).     bismuth subsalicylate (PEPTO BISMOL) 262 MG/15ML suspension Take 30 mLs by mouth every 6 (six) hours as needed for indigestion.     busPIRone (BUSPAR) 10 MG tablet Take 10 mg by mouth 3 (three) times daily. 10/12/23: Reports during TOC call, this medication was prescribed by hospital discharging provider at [facility]- outpatient pharmacy has called and will be delivering to him today per patient report     desvenlafaxine (PRISTIQ) 50 MG 24 hr tablet Take 1 tablet (50 mg total) by mouth daily. 30 tablet 5   diazepam (VALIUM) 5 MG tablet TAKE ONE TABLET BY MOUTH EVERY 8 HOURS AS NEEDED FOR ANXIETY  30 tablet 1   dicyclomine (BENTYL) 20 MG tablet Take 1 tablet (20 mg total) by mouth in the morning and at bedtime. 180 tablet 1   dimenhyDRINATE (DRAMAMINE PO) Take 1 tablet by mouth as needed (nausea).     diphenhydrAMINE (BENADRYL) 25 MG tablet Take 25 mg by mouth every 6 (six) hours as needed for allergies.     ibuprofen (ADVIL) 200 MG tablet Take 400-800 mg by mouth every 6 (six) hours as needed for moderate pain.     lisinopril (ZESTRIL) 20 MG tablet TAKE 1 TABLET BY MOUTH DAILY 90 tablet 1   loperamide (IMODIUM A-D) 2 MG tablet Take 2-4 mg by mouth 4 (four) times daily as needed for diarrhea or loose stools.     metoprolol tartrate (LOPRESSOR) 25 MG tablet Take 1 tablet (25 mg total) by mouth 2 (two) times daily. 180 tablet 1   nitroGLYCERIN (NITROSTAT) 0.4 MG SL tablet Place 0.4 mg under the tongue every 5 (five) minutes as needed for chest pain.     OVER THE COUNTER MEDICATION Take 30 mg by mouth at bedtime. CBD softgels     pantoprazole (PROTONIX) 20 MG tablet TAKE ONE (1) TABLET BY MOUTH EVERY DAY 90 tablet 1   phenylephrine (SUDAFED PE) 10 MG TABS tablet Take 10 mg by mouth every 4 (four) hours as needed (congestion).     rosuvastatin (CRESTOR) 20 MG tablet TAKE ONE (1)  TABLET BY MOUTH EACH DAY 90 tablet 1   traZODone (DESYREL) 50 MG tablet Take 0.5-1 tablets (25-50 mg total) by mouth at bedtime as needed for sleep. 30 tablet 3   venlafaxine XR (EFFEXOR XR) 37.5 MG 24 hr capsule Take 75 mg by mouth daily for 1 week, then 37.5 mg daily for one week, then stop. Use to taper venlefaxine (Patient not taking: Reported on 10/12/2023) 30 capsule 0   vitamin B-12 (CYANOCOBALAMIN) 1000 MCG tablet Take 1,000 mcg by mouth daily.     No current facility-administered medications on file prior to visit.    Review of Systems:  As per HPI- otherwise negative.   Physical Examination: There were no vitals filed for this visit. There were no vitals filed for this visit. There is no height or weight on file to calculate BMI. Ideal Body Weight:    GEN: no acute distress. HEENT: Atraumatic, Normocephalic.  Ears and Nose: No external deformity. CV: RRR, No M/G/R. No JVD. No thrill. No extra heart sounds. PULM: CTA B, no wheezes, crackles, rhonchi. No retractions. No resp. distress. No accessory muscle use. ABD: S, NT, ND, +BS. No rebound. No HSM. EXTR: No c/c/e PSYCH: Normally interactive. Conversant.    Assessment and Plan: ***  Signed Abbe Amsterdam, MD

## 2023-10-14 NOTE — Patient Instructions (Incomplete)
 It was good to see you today!

## 2023-10-15 ENCOUNTER — Ambulatory Visit (INDEPENDENT_AMBULATORY_CARE_PROVIDER_SITE_OTHER): Payer: Medicare (Managed Care) | Admitting: Family Medicine

## 2023-10-15 ENCOUNTER — Other Ambulatory Visit (HOSPITAL_BASED_OUTPATIENT_CLINIC_OR_DEPARTMENT_OTHER): Payer: Self-pay

## 2023-10-15 VITALS — BP 130/82 | HR 80 | Temp 97.6°F | Resp 18 | Ht 70.0 in | Wt >= 6400 oz

## 2023-10-15 DIAGNOSIS — R0789 Other chest pain: Secondary | ICD-10-CM | POA: Diagnosis not present

## 2023-10-15 DIAGNOSIS — J45909 Unspecified asthma, uncomplicated: Secondary | ICD-10-CM

## 2023-10-15 DIAGNOSIS — F325 Major depressive disorder, single episode, in full remission: Secondary | ICD-10-CM

## 2023-10-15 MED ORDER — DESVENLAFAXINE SUCCINATE ER 25 MG PO TB24: 75.0000 mg | ORAL_TABLET | Freq: Every day | ORAL | 3 refills | Status: DC

## 2023-10-15 MED ORDER — ALBUTEROL SULFATE HFA 108 (90 BASE) MCG/ACT IN AERS: 2.0000 | INHALATION_SPRAY | Freq: Four times a day (QID) | RESPIRATORY_TRACT | 9 refills | Status: AC | PRN

## 2023-10-15 MED ORDER — NITROGLYCERIN 0.4 MG SL SUBL: 0.4000 mg | SUBLINGUAL_TABLET | SUBLINGUAL | 1 refills | Status: AC | PRN

## 2023-10-15 MED ORDER — COMIRNATY 30 MCG/0.3ML IM SUSY
0.3000 mL | PREFILLED_SYRINGE | Freq: Once | INTRAMUSCULAR | 0 refills | Status: AC
  Filled 2023-10-15: qty 0.3, 1d supply, fill #0

## 2023-10-16 ENCOUNTER — Other Ambulatory Visit (HOSPITAL_COMMUNITY): Payer: Self-pay

## 2023-10-19 ENCOUNTER — Other Ambulatory Visit: Payer: Self-pay | Admitting: Family Medicine

## 2023-10-19 DIAGNOSIS — F325 Major depressive disorder, single episode, in full remission: Secondary | ICD-10-CM

## 2023-10-19 NOTE — Telephone Encounter (Unsigned)
 Copied from CRM [id number]. Topic: Clinical - Medication Refill >> Oct 19, 2023  1:47 PM [place] J wrote: Most Recent Primary Care Visit:  Provider: Pearline Cables  Department: [facility]  Visit Type: HOSPITAL FOLLOW UP  Date: 10/15/2023  Medication: desvenlafaxine (PRISTIQ) 25 MG  Has the patient contacted their pharmacy? Yes (Agent: If no, request that the patient contact the pharmacy for the refill. If patient does not wish to contact the pharmacy document the reason why and proceed with request.) (Agent: If yes, when and what did the pharmacy advise?)  Is this the correct pharmacy for this prescription? Yes If no, delete pharmacy and type the correct one.  This is the patient's preferred pharmacy:  [facility] - [place], [place] - [address] [place] [address] Phone: [phone] Fax: [phone]   Has the prescription been filled recently? No  Is the patient out of the medication? Yes  Has the patient been seen for an appointment in the last year OR does the patient have an upcoming appointment? Yes  Can we respond through MyChart? Yes  Agent: Please be advised that Rx refills may take up to 3 business days. We ask that you follow-up with your pharmacy.

## 2023-10-20 ENCOUNTER — Other Ambulatory Visit: Payer: Self-pay

## 2023-10-20 ENCOUNTER — Encounter: Payer: Self-pay | Admitting: Family Medicine

## 2023-10-20 DIAGNOSIS — F325 Major depressive disorder, single episode, in full remission: Secondary | ICD-10-CM

## 2023-10-20 MED ORDER — DESVENLAFAXINE SUCCINATE ER 100 MG PO TB24: 100.0000 mg | ORAL_TABLET | Freq: Every day | ORAL | 3 refills | Status: AC

## 2023-10-21 NOTE — Telephone Encounter (Signed)
 Noted.

## 2023-10-21 NOTE — Telephone Encounter (Signed)
 I actually did a PA for him yesterday 10/20/23.   PA was approved from 09/19/23 - 10/19/24

## 2023-10-22 ENCOUNTER — Encounter: Payer: Self-pay | Admitting: Family Medicine

## 2023-10-22 ENCOUNTER — Other Ambulatory Visit (HOSPITAL_COMMUNITY): Payer: Self-pay

## 2023-10-26 ENCOUNTER — Ambulatory Visit (INDEPENDENT_AMBULATORY_CARE_PROVIDER_SITE_OTHER): Payer: Medicare (Managed Care) | Admitting: Family Medicine

## 2023-10-26 VITALS — BP 132/76 | HR 78 | Temp 98.1°F | Resp 18 | Ht 70.0 in | Wt >= 6400 oz

## 2023-10-26 DIAGNOSIS — E1169 Type 2 diabetes mellitus with other specified complication: Secondary | ICD-10-CM | POA: Diagnosis not present

## 2023-10-26 DIAGNOSIS — E669 Obesity, unspecified: Secondary | ICD-10-CM

## 2023-10-26 DIAGNOSIS — Z7985 Long-term (current) use of injectable non-insulin antidiabetic drugs: Secondary | ICD-10-CM

## 2023-10-26 DIAGNOSIS — R110 Nausea: Secondary | ICD-10-CM | POA: Diagnosis not present

## 2023-10-26 MED ORDER — TIRZEPATIDE 2.5 MG/0.5ML ~~LOC~~ SOAJ: 2.5000 mg | SUBCUTANEOUS | Status: DC

## 2023-10-26 MED ORDER — ONDANSETRON HCL 4 MG PO TABS: 4.0000 mg | ORAL_TABLET | Freq: Three times a day (TID) | ORAL | 3 refills | Status: AC | PRN

## 2023-10-26 NOTE — Patient Instructions (Signed)
 Good to see you again today!  We will get you back on the [facility] starting at 2.5 mg weekly Can go up to 5 mg when you are ready Use the zofran as needed for nausea.  This is not meant to be used multiple times every day- if you have unrelenting nausea with the [facility] we need to re-think our plan!

## 2023-10-26 NOTE — Progress Notes (Signed)
 [facility] at [facility] [address] [place], [place] [address] [phone]  Date:  10/26/2023   Name:  Timothy Solis   DOB:  Oct 15, 1965   MRN:  [id number]  PCP:  Pearline Cables, MD    Chief Complaint: Obesity (Concerns/ questions: /)   History of Present Illness:  Timothy Solis is a 58 y.o. very pleasant male patient who presents with the following:  Patient seen today to discuss concerns about his weight Most recent visit with myself about 10 days ago when we were following up from depression and suicidal ideation.  He had been admitted for a few days for medication adjustment and was doing much better  He is on desvenlafaxine 100 now and doing well He notes his mood is improving and he is feeling more like himself.  He is also taking BuSpar as well as trazodone at bedtime  He had in touch with his mental health benefits through his insurance -they are trying to find a psychiatrist and counselor for him   We tried semaglutide and tirzepatide in the past -he stick with [facility] for a few months ago up to 7.5 mg.  This medication was working for weight loss but he dealt with nausea.  He wonders if there is medication that might help with nausea if he were to try this again.  He did not have any particular constipation when he was on the GLP-1.  In fact he tends toward diarrhea due to his IBS He is down about 12 lbs on his own with diet but would like to keep losing to improve his health Bariatric nurse did get in touch with him but they won't look at him for a year due to recent mental health crisis   Patient Active Problem List   Diagnosis Date Noted   Primary osteoarthritis of right knee 10/24/2021   Arthritis 08/14/2021   B12 deficiency 04/02/2021   Synovitis of right knee 04/02/2021   Supraventricular tachycardia (HCC) 08/21/2020   Obesity    Nephrolithiasis    IBS (irritable bowel syndrome)    Chronic fatigue  syndrome    Allergy    Atypical chest pain 07/16/2020   Diabetes, polyneuropathy (HCC) 12/14/2018   OSA on CPAP 12/14/2018   Paresthesia 12/14/2018   Type 2 diabetes mellitus with obesity (HCC) 07/20/2018   Costochondritis 09/14/2017   Dyslipidemia 08/30/2017   Major depressive disorder with single episode, in remission (HCC) 11/05/2015   Chronic fatigue 11/05/2015   Fibromyalgia 06/20/2015   Allergic rhinitis 04/01/2013   Asthma 04/01/2013   Morbid obesity (HCC) 08/20/2012   Depression 08/20/2012   Gastroesophageal reflux disease 08/20/2012   Hypertension 08/20/2012    Past Medical History:  Diagnosis Date   Allergic rhinitis 04/01/2013   Allergy    Anxiety    Arthritis    Asthma 04/01/2013   [facility] Tenny Craw Spirometry done 64% predicted mild reduction/ no obstruction    Atypical chest pain 07/16/2020   Chronic fatigue 11/05/2015   Chronic fatigue syndrome    Costochondritis 09/14/2017   Depression    Diabetes mellitus type 2 in obese 07/20/2018   Diabetes, polyneuropathy (HCC) 12/14/2018   Dyslipidemia 08/30/2017   Fibromyalgia    MECFS   Gastroesophageal reflux disease 08/20/2012   GERD (gastroesophageal reflux disease)    HTN (hypertension)    Hyperlipidemia    Hypertension 08/20/2012   IBS (irritable bowel syndrome)  IBS (irritable bowel syndrome)    Irritable bowel syndrome 08/20/2012   Lumbar disc disease with radiculopathy 06/20/2015   Formatting of this note might be different from the original. L4-5 Disc bulge with left sided pressure on nerve.  No disc rupture  Last Assessment & Plan:  Formatting of this note might be different from the original. Relevant Hx: Course: Daily Update: Today's Plan:  Electronically signed by: Iven Finn II, PA-C 06/20/15 1459   Major depressive disorder with single episode, in remission (HCC) 11/05/2015   ME (myalgic encephalomyelitis)    2016   Morbid obesity (HCC) 08/20/2012    Nephrolithiasis    Obesity    OSA on CPAP 12/14/2018   Paresthesia 12/14/2018   Primary osteoarthritis of right knee 10/24/2021   Sleep apnea    WEARS CPAP   Supraventricular tachycardia (HCC) 08/21/2020   Synovitis of right knee 04/02/2021    Past Surgical History:  Procedure Laterality Date   BACK SURGERY     kidney stone removal     KNEE SURGERY Bilateral    MOUTH SURGERY     SHOULDER SURGERY Left     Social History   Tobacco Use   Smoking status: Former    Types: Cigars    Quit date: 04/20/2020    Years since quitting: 3.5    Passive exposure: Never   Smokeless tobacco: Never  Vaping Use   Vaping status: Never Used  Substance Use Topics   Alcohol use: Not Currently   Drug use: Never    Family History  Problem Relation Age of Onset   Heart disease Mother    Multiple sclerosis Father    Cancer Brother    Multiple sclerosis Brother    Multiple sclerosis Brother    Multiple sclerosis Maternal Uncle    Heart disease Maternal Grandmother    Cancer Maternal Grandfather    Coronary artery disease Other    Cancer Other    Colon cancer Neg Hx    Esophageal cancer Neg Hx    Stomach cancer Neg Hx    Rectal cancer Neg Hx    Colon polyps Neg Hx     Allergies  Allergen Reactions   Ceclor [Cefaclor]     Can't remember what reaction had   Cyclobenzaprine Other (See Comments)    Serotonin syndrome per pt   Gluten Meal     Triggers allergy attack    Oxycodone Itching   Prednisone     cramping and sickness symptoms- just doesn't want a large dose    Medication list has been reviewed and updated.  Current Outpatient Medications on File Prior to Visit  Medication Sig Dispense Refill   albuterol (VENTOLIN HFA) 108 (90 Base) MCG/ACT inhaler Inhale 2 puffs into the lungs every 6 (six) hours as needed for wheezing or shortness of breath.     albuterol (VENTOLIN HFA) 108 (90 Base) MCG/ACT inhaler Inhale 2 puffs into the lungs every 6 (six) hours as needed for wheezing or  shortness of breath. 18 g 9   ASPERCREME LIDOCAINE EX Apply 1 application topically daily as needed (pain).     bismuth subsalicylate (PEPTO BISMOL) 262 MG/15ML suspension Take 30 mLs by mouth every 6 (six) hours as needed for indigestion.     busPIRone (BUSPAR) 10 MG tablet Take 10 mg by mouth 3 (three) times daily. 10/12/23: Reports during TOC call, this medication was prescribed by hospital discharging provider at [facility]- outpatient pharmacy has called and will be delivering to  him today per patient report     desvenlafaxine (PRISTIQ) 100 MG 24 hr tablet Take 1 tablet (100 mg total) by mouth daily. 90 tablet 3   diazepam (VALIUM) 5 MG tablet TAKE ONE TABLET BY MOUTH EVERY 8 HOURS AS NEEDED FOR ANXIETY 30 tablet 1   dicyclomine (BENTYL) 20 MG tablet Take 1 tablet (20 mg total) by mouth in the morning and at bedtime. 180 tablet 1   dimenhyDRINATE (DRAMAMINE PO) Take 1 tablet by mouth as needed (nausea).     diphenhydrAMINE (BENADRYL) 25 MG tablet Take 25 mg by mouth every 6 (six) hours as needed for allergies.     ibuprofen (ADVIL) 200 MG tablet Take 400-800 mg by mouth every 6 (six) hours as needed for moderate pain.     lisinopril (ZESTRIL) 20 MG tablet TAKE 1 TABLET BY MOUTH DAILY 90 tablet 1   loperamide (IMODIUM A-D) 2 MG tablet Take 2-4 mg by mouth 4 (four) times daily as needed for diarrhea or loose stools.     metoprolol tartrate (LOPRESSOR) 25 MG tablet Take 1 tablet (25 mg total) by mouth 2 (two) times daily. 180 tablet 1   nitroGLYCERIN (NITROSTAT) 0.4 MG SL tablet Place 1 tablet (0.4 mg total) under the tongue every 5 (five) minutes as needed for chest pain. 30 tablet 1   OVER THE COUNTER MEDICATION Take 30 mg by mouth at bedtime. CBD softgels     pantoprazole (PROTONIX) 20 MG tablet TAKE ONE (1) TABLET BY MOUTH EVERY DAY 90 tablet 1   phenylephrine (SUDAFED PE) 10 MG TABS tablet Take 10 mg by mouth every 4 (four) hours as needed (congestion).     rosuvastatin (CRESTOR) 20 MG tablet  TAKE ONE (1) TABLET BY MOUTH EACH DAY 90 tablet 1   traZODone (DESYREL) 50 MG tablet Take 0.5-1 tablets (25-50 mg total) by mouth at bedtime as needed for sleep. 30 tablet 3   vitamin B-12 (CYANOCOBALAMIN) 1000 MCG tablet Take 1,000 mcg by mouth daily.     No current facility-administered medications on file prior to visit.    Review of Systems:  As per HPI- otherwise negative.   Physical Examination: Vitals:   10/26/23 1421  BP: 132/76  Pulse: 78  Resp: 18  Temp: 98.1 F (36.7 C)  SpO2: 98%   Vitals:   10/26/23 1421  Weight: (!) 412 lb 9.6 oz (187.2 kg)  Height: 5\' 10"  (1.778 m)   Body mass index is 59.2 kg/m. Ideal Body Weight: Weight in (lb) to have BMI = 25: 173.9  GEN: no acute distress. Obese, looks well  HEENT: Atraumatic, Normocephalic.  Ears and Nose: No external deformity. CV: RRR, No M/G/R. No JVD. No thrill. No extra heart sounds. PULM: CTA B, no wheezes, crackles, rhonchi. No retractions. No resp. distress. No accessory muscle use. ABD: S, NT, ND, +BS. No rebound. No HSM. EXTR: No c/c/e PSYCH: Normally interactive. Conversant.  Patient notes his feet have been hurting.  On exam both feet appear normal, good pulses, somewhat flat arches.  Assessment and Plan: Nausea - Plan: ondansetron (ZOFRAN) 4 MG tablet  Type 2 diabetes mellitus with obesity (HCC) - Plan: tirzepatide (MOUNJARO) 2.5 MG/0.5ML Pen  Morbid obesity (HCC) - Plan: tirzepatide (MOUNJARO) 2.5 MG/0.5ML Pen  Patient seen today with desire to continue losing weight.  He has history of significant obesity and diabetes He has used monitors are successfully in the past, we will get him started back on 2.5 mg.  We can go up  at 4-week increments as needed and tolerated.  I also prescribed Zofran that he can use as needed for nausea.  I have asked him to let me know how this regimen works for him-we can plan to follow up in 3 to 4 months  Lab Results  Component Value Date   HGBA1C 6.6 (H) 03/26/2023      Signed Abbe Amsterdam, MD

## 2023-10-27 ENCOUNTER — Encounter: Payer: Self-pay | Admitting: Family Medicine

## 2023-11-02 ENCOUNTER — Other Ambulatory Visit (HOSPITAL_BASED_OUTPATIENT_CLINIC_OR_DEPARTMENT_OTHER): Payer: Self-pay

## 2023-11-11 ENCOUNTER — Other Ambulatory Visit: Payer: Self-pay | Admitting: Family Medicine

## 2023-11-18 DIAGNOSIS — F3341 Major depressive disorder, recurrent, in partial remission: Secondary | ICD-10-CM | POA: Diagnosis not present

## 2023-11-19 MED ORDER — TIRZEPATIDE 2.5 MG/0.5ML ~~LOC~~ SOAJ: 2.5000 mg | SUBCUTANEOUS | 2 refills | Status: DC

## 2023-11-19 NOTE — Addendum Note (Signed)
 Addended by: Gates Kasal C on: 11/19/2023 05:45 AM   Modules accepted: Orders

## 2023-12-04 ENCOUNTER — Other Ambulatory Visit: Payer: Self-pay | Admitting: Family Medicine

## 2023-12-04 DIAGNOSIS — K589 Irritable bowel syndrome without diarrhea: Secondary | ICD-10-CM

## 2023-12-16 ENCOUNTER — Other Ambulatory Visit: Payer: Self-pay | Admitting: Family Medicine

## 2023-12-18 DIAGNOSIS — F411 Generalized anxiety disorder: Secondary | ICD-10-CM | POA: Diagnosis not present

## 2023-12-18 DIAGNOSIS — F331 Major depressive disorder, recurrent, moderate: Secondary | ICD-10-CM | POA: Diagnosis not present

## 2024-01-18 ENCOUNTER — Other Ambulatory Visit: Payer: Self-pay | Admitting: Family Medicine

## 2024-01-25 ENCOUNTER — Other Ambulatory Visit (HOSPITAL_BASED_OUTPATIENT_CLINIC_OR_DEPARTMENT_OTHER): Payer: Self-pay

## 2024-01-25 ENCOUNTER — Encounter: Payer: Self-pay | Admitting: Family Medicine

## 2024-01-25 MED ORDER — TIRZEPATIDE 2.5 MG/0.5ML ~~LOC~~ SOAJ
2.5000 mg | SUBCUTANEOUS | 2 refills | Status: DC
Start: 2024-01-25 — End: 2024-04-12
  Filled 2024-01-25: qty 2, 28d supply, fill #0
  Filled 2024-02-15: qty 2, 28d supply, fill #1
  Filled 2024-03-15: qty 2, 28d supply, fill #2

## 2024-01-28 ENCOUNTER — Other Ambulatory Visit: Payer: Self-pay | Admitting: Family Medicine

## 2024-01-28 DIAGNOSIS — I10 Essential (primary) hypertension: Secondary | ICD-10-CM

## 2024-01-28 DIAGNOSIS — E785 Hyperlipidemia, unspecified: Secondary | ICD-10-CM

## 2024-01-28 DIAGNOSIS — G4700 Insomnia, unspecified: Secondary | ICD-10-CM

## 2024-02-01 ENCOUNTER — Ambulatory Visit: Payer: Self-pay | Admitting: *Deleted

## 2024-02-01 ENCOUNTER — Encounter: Payer: Self-pay | Admitting: Family Medicine

## 2024-02-01 DIAGNOSIS — G8929 Other chronic pain: Secondary | ICD-10-CM | POA: Diagnosis not present

## 2024-02-01 DIAGNOSIS — M79652 Pain in left thigh: Secondary | ICD-10-CM | POA: Diagnosis not present

## 2024-02-01 NOTE — Telephone Encounter (Signed)
FYI. Pt going to ED.  

## 2024-02-01 NOTE — Telephone Encounter (Signed)
 FYI Only or Action Required?: FYI only for provider.  Patient was last seen in primary care on 10/26/2023 by Copland, Harlene BROCKS, MD.  Called Nurse Triage reporting Leg Pain.  Symptoms began today.  Interventions attempted: OTC medications: took 4 ibuprofen.  Symptoms are: gradually worsening.  Triage Disposition: Go to ED Now (or PCP Triage)  Patient/caregiver understands and will follow disposition?: Unsure           Copied from CRM [id number]. Topic: Clinical - Red Word Triage >> Feb 01, 2024  2:33 PM [place] J wrote: [facility] that prompted transfer to Nurse Triage: Patient believes the tendon in his leg tore. He had a sharp searing pain that shot through his leg.  ----------------------------------------------------------------------- From previous Reason for Contact - Scheduling: Patient/patient representative is calling to schedule an appointment. Refer to attachments for appointment information. Reason for Disposition  Patient sounds very sick or weak to the triager  Answer Assessment - Initial Assessment Questions Recommended emerge ortho and patient reports he will need to get [facility] Asked if patient thinks he can get in and out of a car and patient unsure. Recommended calling 911 for EMS to transport to nearest ED.       1. ONSET: When did the pain start?      Just now  2. LOCATION: Where is the pain located?      Left leg outer thigh area  3. PAIN: How bad is the pain?    (Scale 1-10; or mild, moderate, severe)     Took 4 ibuprofen ,leg tingling  4. WORK OR EXERCISE: Has there been any recent work or exercise that involved this part of the body?      na 5. CAUSE: What do you think is causing the leg pain?     Sitting and stood up and felt sharp searing pain . Pain with bearing weight standing now  6. OTHER SYMPTOMS: Do you have any other symptoms? (e.g., chest pain, back pain, breathing difficulty, swelling, rash, fever, numbness, weakness)     Left  leg tingling  severe pain from outer thigh and  knee down tingling 7. PREGNANCY: Is there any chance you are pregnant? When was your last menstrual period?     na  Protocols used: Leg Pain-A-AH

## 2024-02-04 ENCOUNTER — Encounter: Payer: Self-pay | Admitting: Family Medicine

## 2024-02-24 ENCOUNTER — Ambulatory Visit (INDEPENDENT_AMBULATORY_CARE_PROVIDER_SITE_OTHER): Payer: Medicare (Managed Care)

## 2024-02-24 ENCOUNTER — Ambulatory Visit: Payer: Medicare (Managed Care) | Admitting: Family Medicine

## 2024-02-24 VITALS — BP 124/67 | Ht 70.0 in | Wt 390.0 lb

## 2024-02-24 DIAGNOSIS — Z0000 Encounter for general adult medical examination without abnormal findings: Secondary | ICD-10-CM | POA: Diagnosis not present

## 2024-02-24 NOTE — Progress Notes (Signed)
 Because this visit was a virtual/telehealth visit,  certain criteria was not obtained, such a blood pressure, CBG if applicable, and timed get up and go. Any medications not marked as taking were not mentioned during the medication reconciliation part of the visit. Any vitals not documented were not able to be obtained due to this being a telehealth visit or patient was unable to self-report a recent blood pressure reading due to a lack of equipment at home via telehealth. Vitals that have been documented are verbally provided by the patient.  This visit was performed by a medical professional under my direct supervision. I was immediately available for consultation/collaboration. I have reviewed and agree with the Annual Wellness Visit documentation.  Subjective:   Timothy Solis is a 58 y.o. who presents for a Medicare Wellness preventive visit.  As a reminder, Annual Wellness Visits don't include a physical exam, and some assessments may be limited, especially if this visit is performed virtually. We may recommend an in-person follow-up visit with your provider if needed.  Visit Complete: Virtual I connected with  Timothy Solis on 02/24/24 by a audio enabled telemedicine application and verified that I am speaking with the correct person using two identifiers.  Patient Location: Home  Provider Location: Home Office  I discussed the limitations of evaluation and management by telemedicine. The patient expressed understanding and agreed to proceed.  Vital Signs: Because this visit was a virtual/telehealth visit, some criteria may be missing or patient reported. Any vitals not documented were not able to be obtained and vitals that have been documented are patient reported.  VideoDeclined- This patient declined Librarian, academic. Therefore the visit was completed with audio only.  Persons Participating in Visit: Patient.  AWV Questionnaire: No: Patient  Medicare AWV questionnaire was not completed prior to this visit.  Cardiac Risk Factors include: advanced age (>32men, >7 women);male gender;diabetes mellitus;obesity (BMI >30kg/m2);dyslipidemia;hypertension     Objective:    Today's Vitals   02/24/24 1555  BP: 124/67  Weight: (!) 390 lb (176.9 kg)  Height: 5' 10 (1.778 m)   Body mass index is 55.96 kg/m.     02/24/2024    4:00 PM 09/10/2021    3:11 PM 11/05/2017    3:36 PM  Advanced Directives  Does Patient Have a Medical Advance Directive? Yes No No   Type of Estate agent of [place];Living will    Does patient want to make changes to medical advance directive? No - Patient declined    Copy of Healthcare Power of Attorney in Chart? No - copy requested    Would patient like information on creating a medical advance directive?  No - Patient declined Yes (MAU/Ambulatory/Procedural Areas - Information given)      Data saved with a previous flowsheet row definition    Current Medications (verified) Outpatient Encounter Medications as of 02/24/2024  Medication Sig   albuterol  (VENTOLIN  HFA) 108 (90 Base) MCG/ACT inhaler Inhale 2 puffs into the lungs every 6 (six) hours as needed for wheezing or shortness of breath.   albuterol  (VENTOLIN  HFA) 108 (90 Base) MCG/ACT inhaler Inhale 2 puffs into the lungs every 6 (six) hours as needed for wheezing or shortness of breath.   ASPERCREME LIDOCAINE  EX Apply 1 application topically daily as needed (pain).   bismuth subsalicylate (PEPTO BISMOL) 262 MG/15ML suspension Take 30 mLs by mouth every 6 (six) hours as needed for indigestion.   busPIRone (BUSPAR) 10 MG tablet Take 1 tablet (10  mg total) by mouth 3 (three) times daily.   desvenlafaxine  (PRISTIQ ) 100 MG 24 hr tablet Take 1 tablet (100 mg total) by mouth daily.   diazepam  (VALIUM ) 5 MG tablet TAKE ONE TABLET BY MOUTH EVERY 8 HOURS AS NEEDED FOR ANXIETY   dicyclomine  (BENTYL ) 20 MG tablet TAKE ONE (1) TABLET BY MOUTH  TWO (2) TIMES DAILY   dimenhyDRINATE (DRAMAMINE PO) Take 1 tablet by mouth as needed (nausea).   diphenhydrAMINE (BENADRYL) 25 MG tablet Take 25 mg by mouth every 6 (six) hours as needed for allergies.   ibuprofen (ADVIL) 200 MG tablet Take 400-800 mg by mouth every 6 (six) hours as needed for moderate pain.   lisinopril  (ZESTRIL ) 20 MG tablet Take 1 tablet (20 mg total) by mouth daily.   loperamide (IMODIUM A-D) 2 MG tablet Take 2-4 mg by mouth 4 (four) times daily as needed for diarrhea or loose stools.   metoprolol  tartrate (LOPRESSOR ) 25 MG tablet Take 1 tablet (25 mg total) by mouth 2 (two) times daily.   nitroGLYCERIN  (NITROSTAT ) 0.4 MG SL tablet Place 1 tablet (0.4 mg total) under the tongue every 5 (five) minutes as needed for chest pain.   ondansetron  (ZOFRAN ) 4 MG tablet Take 1 tablet (4 mg total) by mouth every 8 (eight) hours as needed for nausea or vomiting.   OVER THE COUNTER MEDICATION Take 30 mg by mouth at bedtime. CBD softgels   pantoprazole  (PROTONIX ) 20 MG tablet Take 1 tablet (20 mg total) by mouth daily.   phenylephrine  (SUDAFED PE) 10 MG TABS tablet Take 10 mg by mouth every 4 (four) hours as needed (congestion).   rosuvastatin  (CRESTOR ) 20 MG tablet Take 1 tablet (20 mg total) by mouth daily.   tirzepatide  (MOUNJARO ) 2.5 MG/0.5ML Pen Inject 2.5 mg into the skin once a week.   traZODone  (DESYREL ) 50 MG tablet Take 0.5-1 tablets (25-50 mg total) by mouth at bedtime as needed for sleep.   vitamin B-12 (CYANOCOBALAMIN ) 1000 MCG tablet Take 1,000 mcg by mouth daily.   No facility-administered encounter medications on file as of 02/24/2024.    Allergies (verified) Ceclor [cefaclor], Cyclobenzaprine , Gluten meal, Oxycodone, and Prednisone    History: Past Medical History:  Diagnosis Date   Allergic rhinitis 04/01/2013   Allergy    Anxiety    Arthritis    Asthma 04/01/2013   [facility] Okey Spirometry done 64% predicted mild reduction/ no obstruction     Atypical chest pain 07/16/2020   Chronic fatigue 11/05/2015   Chronic fatigue syndrome    Costochondritis 09/14/2017   Depression    Diabetes mellitus type 2 in obese 07/20/2018   Diabetes, polyneuropathy (HCC) 12/14/2018   Dyslipidemia 08/30/2017   Fibromyalgia    MECFS   Gastroesophageal reflux disease 08/20/2012   GERD (gastroesophageal reflux disease)    HTN (hypertension)    Hyperlipidemia    Hypertension 08/20/2012   IBS (irritable bowel syndrome)    IBS (irritable bowel syndrome)    Irritable bowel syndrome 08/20/2012   Lumbar disc disease with radiculopathy 06/20/2015   Formatting of this note might be different from the original. L4-5 Disc bulge with left sided pressure on nerve.  No disc rupture  Last Assessment & Plan:  Formatting of this note might be different from the original. 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/05/2015   ME (myalgic encephalomyelitis)  2016   Morbid obesity (HCC) 08/20/2012   Nephrolithiasis    Obesity    OSA on CPAP 12/14/2018   Paresthesia 12/14/2018   Primary osteoarthritis of right knee 10/24/2021   Sleep apnea    WEARS CPAP   Supraventricular tachycardia (HCC) 08/21/2020   Synovitis of right knee 04/02/2021   Past Surgical History:  Procedure Laterality Date   BACK SURGERY     kidney stone removal     KNEE SURGERY Bilateral    MOUTH SURGERY     SHOULDER SURGERY Left    Family History  Problem Relation Age of Onset   Heart disease Mother    Multiple sclerosis Father    Cancer Brother    Multiple sclerosis Brother    Multiple sclerosis Brother    Multiple sclerosis Maternal Uncle    Heart disease Maternal Grandmother    Cancer Maternal Grandfather    Coronary artery disease Other    Cancer Other    Colon cancer Neg Hx    Esophageal cancer Neg Hx    Stomach cancer Neg Hx     Rectal cancer Neg Hx    Colon polyps Neg Hx    Social History   Socioeconomic History   Marital status: Divorced    Spouse name: Not on file   Number of children: 0   Years of education: 14   Highest education level: Associate degree: occupational, Scientist, product/process development, or vocational program  Occupational History   Occupation: Disability  Tobacco Use   Smoking status: Former    Types: Cigars    Quit date: 04/20/2020    Years since quitting: 3.8    Passive exposure: Never   Smokeless tobacco: Never  Vaping Use   Vaping status: Never Used  Substance and Sexual Activity   Alcohol use: Not Currently   Drug use: Never   Sexual activity: Not Currently  Other Topics Concern   Not on file  Social History Narrative   Right handed    Caffeine use: Coffee/tea/soda daily   Lives alone   Social Drivers of Health   Financial Resource Strain: Low Risk  (02/24/2024)   Overall Financial Resource Strain (CARDIA)    Difficulty of Paying Living Expenses: Not very hard  Food Insecurity: No Food Insecurity (02/24/2024)   Hunger Vital Sign    Worried About Running Out of Food in the Last Year: Never true    Ran Out of Food in the Last Year: Never true  Transportation Needs: No Transportation Needs (02/24/2024)   PRAPARE - Administrator, Civil Service (Medical): No    Lack of Transportation (Non-Medical): No  Physical Activity: Insufficiently Active (02/24/2024)   Exercise Vital Sign    Days of Exercise per Week: 7 days    Minutes of Exercise per Session: 10 min  Stress: No Stress Concern Present (02/24/2024)   [employer] of Occupational Health - Occupational Stress Questionnaire    Feeling of Stress: Not at all  Social Connections: Moderately Integrated (02/24/2024)   Social Connection and Isolation Panel    Frequency of Communication with Friends and Family: More than three times a week    Frequency of Social Gatherings with Friends and Family: Twice a week    Attends Religious  Services: More than 4 times per year    Active Member of [employer] or Organizations: No    Attends Engineer, structural: More than 4 times per year    Marital Status: Divorced    Tobacco Counseling  Counseling given: Not Answered    Clinical Intake:  Pre-visit preparation completed: Yes  Pain : No/denies pain     BMI - recorded: 55.96 Nutritional Status: BMI > 30  Obese Nutritional Risks: None Diabetes: Yes CBG done?: No Did pt. bring in CBG monitor from home?: No  Lab Results  Component Value Date   HGBA1C 6.6 (H) 03/26/2023   HGBA1C 6.0 04/16/2022   HGBA1C 5.9 09/26/2021     How often do you need to have someone help you when you read instructions, pamphlets, or other written materials from your doctor or pharmacy?: 1 - Never What is the last grade level you completed in school?: 2 year degree  Interpreter Needed?: No  Information entered by :: Corazon Nickolas,Cma   Activities of Daily Living     02/24/2024    3:58 PM  In your present state of health, do you have any difficulty performing the following activities:  Hearing? 0  Vision? 0  Difficulty concentrating or making decisions? 1  Comment from time to time  Walking or climbing stairs? 1  Comment patient has trouble with both  Dressing or bathing? 0  Doing errands, shopping? 0  Preparing Food and eating ? N  Using the Toilet? N  In the past six months, have you accidently leaked urine? N  Do you have problems with loss of bowel control? Y  Managing your Medications? N  Managing your Finances? N  Housekeeping or managing your Housekeeping? N    Patient Care Team: Copland, Harlene BROCKS, MD as PCP - General (Family Medicine)  I have updated your Care Teams any recent Medical Services you may have received from other providers in the past year.     Assessment:   This is a routine wellness examination for Timothy Solis.  Hearing/Vision screen Hearing Screening - Comments:: Only difficulties when in a  crowded room Vision Screening - Comments:: Patient wears glasses    Goals Addressed             This Visit's Progress    Patient Stated   On track    Continue to lose weight & increase activity       Depression Screen     02/24/2024    4:01 PM 09/02/2023    2:10 PM 06/02/2022    1:19 PM 04/16/2022    2:34 PM 09/10/2021    3:14 PM 11/05/2017    3:39 PM 09/14/2017    1:49 PM  PHQ 2/9 Scores  PHQ - 2 Score 0 4 0 0 0 0 0  PHQ- 9 Score 1 16 0 0   10    Fall Risk     02/24/2024    4:00 PM 09/02/2023    2:10 PM 06/02/2022    1:19 PM 09/10/2021    3:12 PM 11/05/2017    3:39 PM  Fall Risk   Falls in the past year? 1 0 0 1 Yes   Number falls in past yr: 1 0 0 1   Injury with Fall? 1 0 0 0   Risk for fall due to : History of fall(s);Impaired balance/gait;Impaired mobility Impaired mobility  History of fall(s)   Follow up Falls evaluation completed  Falls evaluation completed  Falls prevention discussed       Data saved with a previous flowsheet row definition    MEDICARE RISK AT HOME:  Medicare Risk at Home Any stairs in or around the home?: No If so, are there any without handrails?: No  Home free of loose throw rugs in walkways, pet beds, electrical cords, etc?: Yes Adequate lighting in your home to reduce risk of falls?: Yes Life alert?: No Use of a cane, walker or w/c?: No Grab bars in the bathroom?: Yes Shower chair or bench in shower?: Yes Elevated toilet seat or a handicapped toilet?: Yes  TIMED UP AND GO:  Was the test performed?  No  Cognitive Function: 6CIT completed        02/24/2024    3:57 PM  6CIT Screen  What Year? 0 points  What month? 0 points  What time? 0 points  Count back from 20 0 points  Months in reverse 0 points  Repeat phrase 0 points  Total Score 0 points    Immunizations Immunization History  Administered Date(s) Administered   Influenza, Seasonal, Injecte, Preservative Fre 03/26/2023   Influenza,inj,Quad PF,6+ Mos 05/07/2016,  08/31/2017, 07/19/2018, 07/04/2020, 04/16/2022   Influenza-Unspecified 05/19/2019   PFIZER(Purple Top)SARS-COV-2 Vaccination 02/03/2020, 02/24/2020   Pfizer Covid-19 Vaccine Bivalent Booster 66yrs & up 08/19/2021   Pfizer(Comirnaty )Fall Seasonal Vaccine 12 years and older 10/15/2023   Pneumococcal Polysaccharide-23 05/19/2019   Tdap 11/05/2015   Zoster Recombinant(Shingrix ) 03/26/2023, 09/02/2023    Screening Tests Health Maintenance  Topic Date Due   OPHTHALMOLOGY EXAM  Never done   Diabetic kidney evaluation - Urine ACR  Never done   Hepatitis B Vaccines (1 of 3 - 19+ 3-dose series) Never done   Colonoscopy  Never done   Pneumococcal Vaccine: 19-49 Years (2 of 2 - PCV) 05/18/2020   Pneumococcal Vaccine: 50+ Years (2 of 2 - PCV) 05/18/2020   HEMOGLOBIN A1C  09/23/2023   INFLUENZA VACCINE  02/19/2024   Diabetic kidney evaluation - eGFR measurement  03/25/2024   FOOT EXAM  03/25/2024   Medicare Annual Wellness (AWV)  02/23/2025   DTaP/Tdap/Td (2 - Td or Tdap) 11/04/2025   COVID-19 Vaccine  Completed   Hepatitis C Screening  Completed   HIV Screening  Completed   Zoster Vaccines- Shingrix   Completed   HPV VACCINES  Aged Out   Meningococcal B Vaccine  Aged Out    Health Maintenance  Health Maintenance Due  Topic Date Due   OPHTHALMOLOGY EXAM  Never done   Diabetic kidney evaluation - Urine ACR  Never done   Hepatitis B Vaccines (1 of 3 - 19+ 3-dose series) Never done   Colonoscopy  Never done   Pneumococcal Vaccine: 19-49 Years (2 of 2 - PCV) 05/18/2020   Pneumococcal Vaccine: 50+ Years (2 of 2 - PCV) 05/18/2020   HEMOGLOBIN A1C  09/23/2023   INFLUENZA VACCINE  02/19/2024   Diabetic kidney evaluation - eGFR measurement  03/25/2024   Health Maintenance Items Addressed:patient declined health maintenance at this time  Additional Screening:  Vision Screening: Recommended annual ophthalmology exams for early detection of glaucoma and other disorders of the eye. Would you  like a referral to an eye doctor? No    Dental Screening: Recommended annual dental exams for proper oral hygiene  Community Resource Referral / Chronic Care Management: CRR required this visit?  No   CCM required this visit?  No   Plan:    I have personally reviewed and noted the following in the patient's chart:   Medical and social history Use of alcohol, tobacco or illicit drugs  Current medications and supplements including opioid prescriptions. Patient is not currently taking opioid prescriptions. Functional ability and status Nutritional status Physical activity Advanced directives List of other physicians Hospitalizations,  surgeries, and ER visits in previous 12 months Vitals Screenings to include cognitive, depression, and falls Referrals and appointments  In addition, I have reviewed and discussed with patient certain preventive protocols, quality metrics, and best practice recommendations. A written personalized care plan for preventive services as well as general preventive health recommendations were provided to patient.   Lyle MARLA Right, [place]   02/24/2024   After Visit Summary: (MyChart) Due to this being a telephonic visit, the after visit summary with patients personalized plan was offered to patient via MyChart   Notes: Nothing significant to report at this time.

## 2024-02-24 NOTE — Patient Instructions (Signed)
 Mr. Marzella , Thank you for taking time out of your busy schedule to complete your Annual Wellness Visit with me. I enjoyed our conversation and look forward to speaking with you again next year. I, as well as your care team,  appreciate your ongoing commitment to your health goals. Please review the following plan we discussed and let me know if I can assist you in the future. Your Game plan/ To Do List    Referrals: If you haven't heard from the office you've been referred to, please reach out to them at the phone provided.   Follow up Visits: We will see or speak with you next year for your Next Medicare AWV with our clinical staff Have you seen your provider in the last 6 months (3 months if uncontrolled diabetes)? Yes  Clinician Recommendations:  Aim for 30 minutes of exercise or brisk walking, 6-8 glasses of water, and 5 servings of fruits and vegetables each day.       This is a list of the screenings recommended for you:  Health Maintenance  Topic Date Due   Eye exam for diabetics  Never done   Yearly kidney health urinalysis for diabetes  Never done   Hepatitis B Vaccine (1 of 3 - 19+ 3-dose series) Never done   Colon Cancer Screening  Never done   Pneumococcal Vaccine for high risk medical condition (2 of 2 - PCV) 05/18/2020   Pneumococcal Vaccine for age over 69 (2 of 2 - PCV) 05/18/2020   Hemoglobin A1C  09/23/2023   Flu Shot  02/19/2024   Yearly kidney function blood test for diabetes  03/25/2024   Complete foot exam   03/25/2024   Medicare Annual Wellness Visit  02/23/2025   DTaP/Tdap/Td vaccine (2 - Td or Tdap) 11/04/2025   COVID-19 Vaccine  Completed   Hepatitis C Screening  Completed   HIV Screening  Completed   Zoster (Shingles) Vaccine  Completed   HPV Vaccine  Aged Out   Meningitis B Vaccine  Aged Out    Advanced directives: (Copy Requested) Please bring a copy of your health care power of attorney and living will to the office to be added to your chart at your  convenience. You can mail to [facility] [address] [place], [place] [address] or email to [email or web address] Advance Care Planning is important because it:  [x]  Makes sure you receive the medical care that is consistent with your values, goals, and preferences  [x]  It provides guidance to your family and loved ones and reduces their decisional burden about whether or not they are making the right decisions based on your wishes.  Follow the link provided in your after visit summary or read over the paperwork we have mailed to you to help you started getting your Advance Directives in place. If you need assistance in completing these, please reach out to us  so that we can help you!  See attachments for Preventive Care and Fall Prevention Tips.

## 2024-03-15 ENCOUNTER — Other Ambulatory Visit: Payer: Self-pay | Admitting: Family Medicine

## 2024-03-15 ENCOUNTER — Telehealth: Payer: Self-pay

## 2024-03-15 NOTE — Progress Notes (Addendum)
 [facility] [employer] [address], [address] [place], [place] [address] [phone]  Date:  03/16/2024   Name:  Timothy Solis   DOB:  Nov 03, 1965   MRN:  [id number]  PCP:  Watt Harlene BROCKS, MD    Chief Complaint: Bradycardia (In the last week patients apple watch has read his resting HR of 49-51. Previously his normal was around 60. )   History of Present Illness:  Timothy Solis is a 58 y.o. very pleasant male patient who presents with the following:  Patient seen today with concern about bradycardia.  He called us  yesterday and reported his pulse has been running in the 50s the last several days.  I saw him most recently in April-at that time he had recently been admitted for a few days with suicidal ideation, his medication was adjusted and he was doing better History of obesity, diabetes and neuropathy, dyslipidemia, chronic fatigue, fibromyalgia, hypertension Pristiq  100 Diazepam  as needed Lisinopril  20 Metoprolol  25 twice daily Crestor  20 Mounjaro  Trazodone   He has seen cardiology in the past, and visit with Dr. Bernie in January 2023-he was seen for SVT which responded to beta-blocker Nuclear stress 2019 Zio patch January 2022-  Patient had a minimum heart rate of 60 bpm, maximum heart rate of 190 bpm, and average heart rate of 80 bpm. Predominant underlying rhythm was sinus rhythm. Two runs of supraventricular tachycardia occurred lasting 7 beats at longest with a max rate of 190 bpm at fastest. Isolated PACs were rare (<1.0%). Isolated PVCs were rare (<1.0%), with rare couplets. No evidence of complete heart block. Inverted QRS complexes possibly due to inverted placement of device. Triggered and diary events associated with sinus rhythm and sinus tachycardia.   We can update labs today Discussed the use of AI scribe software for clinical note transcription with the patient, who gave verbal consent to  proceed.  History of Present Illness Timothy Solis is a 58 year old male with chronic fatigue syndrome who presents with concerns about a slow heart rate.  Over the past six days, he noticed a decrease in his heart rate, as tracked by his Apple Watch, from his usual average of 60 beats per minute to between 49 and 51 beats per minute. The heart rate returned to 60 beats per minute the night before the visit. A previous two-week heart monitor in 2022 showed an average heart rate of 80 beats per minute. He has not experienced any irregular heartbeats recently.  He denies experiencing symptoms such as fatigue or weakness directly related to the heart rate drop. However, he mentions a recent exacerbation of his chronic fatigue syndrome, during which he spent significant time in bed, often until 3 PM, and experienced exacerbation of other symptoms. No chest pain or difficulty breathing, except when related to muscle cramps. No fainting, but he felt lightheaded during the CFS exacerbation, which he manages by avoiding rapid position changes.  He is currently taking metoprolol , 25 mg twice daily. He has not noticed any recent changes in his medication regimen. He also uses trazodone  as a sleeping aid, but only used it on two non-consecutive days during the six-day period of heart rate changes. He reports improved sleep quality after purchasing a new mattress. He has not consumed coffee for the past week and a half, but typically only drinks one 12-ounce cup per day.  Looking back he was noted to have SVT in  the past which is why he started on metoprolol     Patient Active Problem List   Diagnosis Date Noted   Primary osteoarthritis of right knee 10/24/2021   Arthritis 08/14/2021   B12 deficiency 04/02/2021   Synovitis of right knee 04/02/2021   Supraventricular tachycardia (HCC) 08/21/2020   Obesity    Nephrolithiasis    IBS (irritable bowel syndrome)    Chronic fatigue syndrome     Allergy    Atypical chest pain 07/16/2020   Diabetes, polyneuropathy (HCC) 12/14/2018   OSA on CPAP 12/14/2018   Paresthesia 12/14/2018   Type 2 diabetes mellitus with obesity (HCC) 07/20/2018   Costochondritis 09/14/2017   Dyslipidemia 08/30/2017   Major depressive disorder with single episode, in remission (HCC) 11/05/2015   Chronic fatigue 11/05/2015   Fibromyalgia 06/20/2015   Allergic rhinitis 04/01/2013   Asthma 04/01/2013   Morbid obesity (HCC) 08/20/2012   Depression 08/20/2012   Gastroesophageal reflux disease 08/20/2012   Hypertension 08/20/2012    Past Medical History:  Diagnosis Date   Allergic rhinitis 04/01/2013   Allergy    Anxiety    Arthritis    Asthma 04/01/2013   [facility] Okey Spirometry done 64% predicted mild reduction/ no obstruction    Atypical chest pain 07/16/2020   Chronic fatigue 11/05/2015   Chronic fatigue syndrome    Costochondritis 09/14/2017   Depression    Diabetes mellitus type 2 in obese 07/20/2018   Diabetes, polyneuropathy (HCC) 12/14/2018   Dyslipidemia 08/30/2017   Fibromyalgia    MECFS   Gastroesophageal reflux disease 08/20/2012   GERD (gastroesophageal reflux disease)    HTN (hypertension)    Hyperlipidemia    Hypertension 08/20/2012   IBS (irritable bowel syndrome)    IBS (irritable bowel syndrome)    Irritable bowel syndrome 08/20/2012   Lumbar disc disease with radiculopathy 06/20/2015   Formatting of this note might be different from the original. L4-5 Disc bulge with left sided pressure on nerve.  No disc rupture  Last Assessment & Plan:  Formatting of this note might be different from the original. Relevant Hx: Course: Daily Update: Today's Plan:  Electronically signed by: Timothy [name], PA-C 06/20/15 1459   Major depressive disorder with single episode, in remission (HCC) 11/05/2015   ME (myalgic encephalomyelitis)    2016   Morbid obesity (HCC) 08/20/2012   Nephrolithiasis     Obesity    OSA on CPAP 12/14/2018   Paresthesia 12/14/2018   Primary osteoarthritis of right knee 10/24/2021   Sleep apnea    WEARS CPAP   Supraventricular tachycardia (HCC) 08/21/2020   Synovitis of right knee 04/02/2021    Past Surgical History:  Procedure Laterality Date   BACK SURGERY     kidney stone removal     KNEE SURGERY Bilateral    MOUTH SURGERY     SHOULDER SURGERY Left     Social History   Tobacco Use   Smoking status: Former    Types: Cigars    Quit date: 04/20/2020    Years since quitting: 3.9    Passive exposure: Never   Smokeless tobacco: Never  Vaping Use   Vaping status: Never Used  Substance Use Topics   Alcohol use: Not Currently   Drug use: Never    Family History  Problem Relation Age of Onset   Heart disease Mother    Multiple sclerosis Father    Cancer Brother    Multiple sclerosis Brother    Multiple sclerosis  Brother    Multiple sclerosis Maternal Uncle    Heart disease Maternal Grandmother    Cancer Maternal Grandfather    Coronary artery disease Other    Cancer Other    Colon cancer Neg Hx    Esophageal cancer Neg Hx    Stomach cancer Neg Hx    Rectal cancer Neg Hx    Colon polyps Neg Hx     Allergies  Allergen Reactions   Ceclor [Cefaclor]     Can't remember what reaction had   Cyclobenzaprine  Other (See Comments)    Serotonin syndrome per pt   Gluten Meal     Triggers allergy attack    Oxycodone Itching   Prednisone      cramping and sickness symptoms- just doesn't want a large dose    Medication list has been reviewed and updated.  Current Outpatient Medications on File Prior to Visit  Medication Sig Dispense Refill   albuterol  (VENTOLIN  HFA) 108 (90 Base) MCG/ACT inhaler Inhale 2 puffs into the lungs every 6 (six) hours as needed for wheezing or shortness of breath.     albuterol  (VENTOLIN  HFA) 108 (90 Base) MCG/ACT inhaler Inhale 2 puffs into the lungs every 6 (six) hours as needed for wheezing or shortness of  breath. 18 g 9   ASPERCREME LIDOCAINE  EX Apply 1 application topically daily as needed (pain).     bismuth subsalicylate (PEPTO BISMOL) 262 MG/15ML suspension Take 30 mLs by mouth every 6 (six) hours as needed for indigestion.     busPIRone (BUSPAR) 10 MG tablet Take 1 tablet (10 mg total) by mouth 3 (three) times daily. 270 tablet 0   desvenlafaxine  (PRISTIQ ) 100 MG 24 hr tablet Take 1 tablet (100 mg total) by mouth daily. 90 tablet 3   diazepam  (VALIUM ) 5 MG tablet TAKE ONE TABLET BY MOUTH EVERY 8 HOURS AS NEEDED FOR ANXIETY 30 tablet 1   dicyclomine  (BENTYL ) 20 MG tablet TAKE ONE (1) TABLET BY MOUTH TWO (2) TIMES DAILY 180 tablet 1   dimenhyDRINATE (DRAMAMINE PO) Take 1 tablet by mouth as needed (nausea).     diphenhydrAMINE (BENADRYL) 25 MG tablet Take 25 mg by mouth every 6 (six) hours as needed for allergies.     ibuprofen (ADVIL) 200 MG tablet Take 400-800 mg by mouth every 6 (six) hours as needed for moderate pain.     lisinopril  (ZESTRIL ) 20 MG tablet Take 1 tablet (20 mg total) by mouth daily. 90 tablet 0   loperamide (IMODIUM A-D) 2 MG tablet Take 2-4 mg by mouth 4 (four) times daily as needed for diarrhea or loose stools.     metoprolol  tartrate (LOPRESSOR ) 25 MG tablet Take 1 tablet (25 mg total) by mouth 2 (two) times daily. 180 tablet 1   nitroGLYCERIN  (NITROSTAT ) 0.4 MG SL tablet Place 1 tablet (0.4 mg total) under the tongue every 5 (five) minutes as needed for chest pain. 30 tablet 1   ondansetron  (ZOFRAN ) 4 MG tablet Take 1 tablet (4 mg total) by mouth every 8 (eight) hours as needed for nausea or vomiting. 30 tablet 3   OVER THE COUNTER MEDICATION Take 30 mg by mouth at bedtime. CBD softgels     pantoprazole  (PROTONIX ) 20 MG tablet Take 1 tablet (20 mg total) by mouth daily. 90 tablet 0   phenylephrine  (SUDAFED PE) 10 MG TABS tablet Take 10 mg by mouth every 4 (four) hours as needed (congestion).     rosuvastatin  (CRESTOR ) 20 MG tablet Take 1 tablet (20  mg total) by mouth daily.  90 tablet 0   tirzepatide  (MOUNJARO ) 2.5 MG/0.5ML Pen Inject 2.5 mg into the skin once a week. 2 mL 2   traZODone  (DESYREL ) 50 MG tablet Take 0.5-1 tablets (25-50 mg total) by mouth at bedtime as needed for sleep. 90 tablet 0   vitamin B-12 (CYANOCOBALAMIN ) 1000 MCG tablet Take 1,000 mcg by mouth daily.     No current facility-administered medications on file prior to visit.    Review of Systems:  As per HPI- otherwise negative.   Physical Examination: Vitals:   03/16/24 0939  BP: 136/82  Pulse: 80  Temp: 98.3 F (36.8 C)  SpO2: 98%   Vitals:   03/16/24 0939  Weight: (!) 395 lb (179.2 kg)  Height: 5' 10 (1.778 m)   Body mass index is 56.68 kg/m. Ideal Body Weight: Weight in (lb) to have BMI = 25: 173.9  GEN: no acute distress. Morbid obesity, ow looks well  HEENT: Atraumatic, Normocephalic.  Ears and Nose: No external deformity. CV: RRR, No M/G/R. No JVD. No thrill. No extra heart sounds. PULM: CTA B, no wheezes, crackles, rhonchi. No retractions. No resp. distress. No accessory muscle use. EXTR: No c/c/e PSYCH: Normally interactive. Conversant.   EKG: SR Compared with previous 1/23 no significant change is noted  Assessment and Plan: Primary hypertension - Plan: CBC, Comprehensive metabolic panel with GFR  OSA on CPAP  Gastroesophageal reflux disease, unspecified whether esophagitis present  Diabetic polyneuropathy associated with type 2 diabetes mellitus (HCC)  Type 2 diabetes mellitus with obesity (HCC) - Plan: Hemoglobin A1c, Lipid panel  Bradycardia - Plan: TSH, EKG 12-Lead  Special screening, prostate cancer - Plan: PSA  Assessment & Plan Bradycardia Heart rate decreased to 49-51 bpm over six days, typically 60 bpm. No significant symptoms. Metoprolol  may contribute. EKG at 79 bpm, no heart block. - Order EKG to assess electrical conduction. - Order blood work to rule out thyroid  issues. - Reduce metoprolol : halve evening dose for five days, then  reduce morning dose also to 12.5 mg - Monitor symptoms and heart rate response. Offered repeat zio but he prefers to try this first   Essential hypertension Managed with metoprolol  for supraventricular tachycardia. Blood pressure management to be reassessed post metoprolol  adjustment.  Signed Harlene Schroeder, MD  Received labs, message to pt  Results for orders placed or performed in visit on 03/16/24  CBC   Collection Time: 03/16/24 10:33 AM  Result Value Ref Range   WBC 7.2 4.0 - 10.5 K/uL   RBC 5.22 4.22 - 5.81 Mil/uL   Platelets 218.0 150.0 - 400.0 K/uL   Hemoglobin 14.7 13.0 - 17.0 g/dL   HCT 55.6 60.9 - 47.9 %   MCV 85.0 78.0 - 100.0 fl   MCHC 33.2 30.0 - 36.0 g/dL   RDW 86.3 88.4 - 84.4 %  Comprehensive metabolic panel with GFR   Collection Time: 03/16/24 10:33 AM  Result Value Ref Range   Sodium 142 135 - 145 mEq/L   Potassium 4.5 3.5 - 5.1 mEq/L   Chloride 104 96 - 112 mEq/L   CO2 28 19 - 32 mEq/L   Glucose, Bld 123 (H) 70 - 99 mg/dL   BUN 18 6 - 23 mg/dL   Creatinine, Ser 8.81 0.40 - 1.50 mg/dL   Total Bilirubin 0.4 0.2 - 1.2 mg/dL   Alkaline Phosphatase 74 39 - 117 U/L   AST 21 0 - 37 U/L   ALT 27 0 - 53 U/L  Total Protein 6.8 6.0 - 8.3 g/dL   Albumin 4.0 3.5 - 5.2 g/dL   GFR 30.81 >39.99 mL/min   Calcium  8.6 8.4 - 10.5 mg/dL  Hemoglobin J8r   Collection Time: 03/16/24 10:33 AM  Result Value Ref Range   Hgb A1c MFr Bld 6.5 4.6 - 6.5 %  Lipid panel   Collection Time: 03/16/24 10:33 AM  Result Value Ref Range   Cholesterol 110 0 - 200 mg/dL   Triglycerides 872.9 0.0 - 149.0 mg/dL   HDL 63.09 (L) >60.99 mg/dL   VLDL 74.5 0.0 - 59.9 mg/dL   LDL Cholesterol 48 0 - 99 mg/dL   Total CHOL/HDL Ratio 3    NonHDL 73.25   PSA   Collection Time: 03/16/24 10:33 AM  Result Value Ref Range   PSA 0.50 0.10 - 4.00 ng/mL  TSH   Collection Time: 03/16/24 10:33 AM  Result Value Ref Range   TSH 0.99 0.35 - 5.50 uIU/mL    "

## 2024-03-15 NOTE — Patient Instructions (Incomplete)
 It was good to see you today-I will be in touch with your labs Recommend COVID booster, flu shot this fall  Let's drop your metoprolol  to 12.5 mg twice daily- decrease the evening dose first for maybe 5 days, then drop the morning dose also,  let me know how you do!

## 2024-03-15 NOTE — Telephone Encounter (Signed)
 Initial Comment Caller states his HR is 51 and he is fatigued; normal for him. Translation No Nurse Assessment Nurse: Manda, RN, Vera Date/Time (Eastern Time): 03/15/2024 12:29:06 PM Confirm and document reason for call. If symptomatic, describe symptoms. ---Caller states his resting heart rate is 51. he is fatigued. no SOB or chest pain. Has had lower heart rate for 6 days Does the patient have any new or worsening symptoms? ---Yes Will a triage be completed? ---Yes Related visit to physician within the last 2 weeks? ---No Does the PT have any chronic conditions? (i.e. diabetes, asthma, this includes High risk factors for pregnancy, etc.) ---Yes List chronic conditions. ---chronic fatigue syndrome; diabetes; Crohn's disease; HTN; asthma Is this a behavioral health or substance abuse call? ---No Guidelines Guideline Title Affirmed Question Affirmed Notes Nurse Date/Time Titus Time) Heart Rate and Heartbeat Questions Palpitations Manda, RN, Olena 03/15/2024 12:31:02 PM Disp. Time Titus Time) Disposition Final User 03/15/2024 12:44:40 PM SEE PCP WITHIN 3 DAYS Yes Manda, RN, Olena Final Disposition 03/15/2024 12:44:40 PM SEE PCP WITHIN 3 DAYS Yes Manda, RN, Vera PLEASE NOTE: All timestamps contained within this report are represented as [place] Standard Time. CONFIDENTIALTY NOTICE: This fax transmission is intended only for the addressee. It contains information that is legally privileged, confidential or otherwise protected from use or disclosure. If you are not the intended recipient, you are strictly prohibited from reviewing, disclosing, copying using or disseminating any of this information or taking any action in reliance on or regarding this information. If you have received this fax in error, please notify us  immediately by telephone so that we can arrange for its return to us . Phone: [phone], Toll-Free: [phone], Fax:  [phone] CORWIN_HACKER 1965-09-01 Page: 1 of2 CallId: [id number] Disposition Overriden: Home Care Override Reason: Patient's symptoms need a higher level of care Caller Disagree/Comply Disagree Caller Understands Yes PreDisposition Call Doctor Care Advice Given Per Guideline HEALTHY LIVING TIPS FOR PEOPLE WITH PALPITATIONS: * Diet: Eat a balanced healthy diet. * Smoking: Stop or reduce your smoking. AVOID CAFFEINE: * Avoid drinking beverages that contain caffeine. Reason: Caffeine is a stimulant and can make palpitations worse. CALL BACK IF: * Chest pain, lightheadedness or difficulty breathing occurs * More than 3 extra or skipped beats / minute * You become worse CARE ADVICE given per Heart Rate and Heartbeat Questions (Adult) guideline. SEE PCP WITHIN 3 DAYS: * You need to be seen within 2 or 3 days. Comments User: Olena Manda, RN Date/Time Titus Time): 03/15/2024 12:45:45 PM triage outcome upgraded to see physician within 3 days as pt has chronic fatigue syndrome. User: Olena Manda, RN Date/Time Titus Time): 03/15/2024 12:46:07 PM Pt has appt Sept 3 with Dr. Watt and does not want to see anyone else. Referrals GO TO FACILITY REFUSED

## 2024-03-15 NOTE — Telephone Encounter (Signed)
 Triage recommended Pt be seen within 3 days. Pt refusing to see anyone but PCP

## 2024-03-16 ENCOUNTER — Encounter: Payer: Self-pay | Admitting: Family Medicine

## 2024-03-16 ENCOUNTER — Other Ambulatory Visit (HOSPITAL_BASED_OUTPATIENT_CLINIC_OR_DEPARTMENT_OTHER): Payer: Self-pay

## 2024-03-16 ENCOUNTER — Ambulatory Visit (INDEPENDENT_AMBULATORY_CARE_PROVIDER_SITE_OTHER): Payer: Medicare (Managed Care) | Admitting: Family Medicine

## 2024-03-16 VITALS — BP 136/82 | HR 80 | Temp 98.3°F | Ht 70.0 in | Wt 395.0 lb

## 2024-03-16 DIAGNOSIS — R001 Bradycardia, unspecified: Secondary | ICD-10-CM

## 2024-03-16 DIAGNOSIS — K219 Gastro-esophageal reflux disease without esophagitis: Secondary | ICD-10-CM

## 2024-03-16 DIAGNOSIS — E1142 Type 2 diabetes mellitus with diabetic polyneuropathy: Secondary | ICD-10-CM

## 2024-03-16 DIAGNOSIS — I10 Essential (primary) hypertension: Secondary | ICD-10-CM

## 2024-03-16 DIAGNOSIS — Z125 Encounter for screening for malignant neoplasm of prostate: Secondary | ICD-10-CM | POA: Diagnosis not present

## 2024-03-16 DIAGNOSIS — E1169 Type 2 diabetes mellitus with other specified complication: Secondary | ICD-10-CM | POA: Diagnosis not present

## 2024-03-16 DIAGNOSIS — G4733 Obstructive sleep apnea (adult) (pediatric): Secondary | ICD-10-CM

## 2024-03-16 LAB — COMPREHENSIVE METABOLIC PANEL WITH GFR
ALT: 27 U/L (ref 0–53)
AST: 21 U/L (ref 0–37)
Albumin: 4 g/dL (ref 3.5–5.2)
Alkaline Phosphatase: 74 U/L (ref 39–117)
BUN: 18 mg/dL (ref 6–23)
CO2: 28 meq/L (ref 19–32)
Calcium: 8.6 mg/dL (ref 8.4–10.5)
Chloride: 104 meq/L (ref 96–112)
Creatinine, Ser: 1.18 mg/dL (ref 0.40–1.50)
GFR: 69.18 mL/min
Glucose, Bld: 123 mg/dL — ABNORMAL HIGH (ref 70–99)
Potassium: 4.5 meq/L (ref 3.5–5.1)
Sodium: 142 meq/L (ref 135–145)
Total Bilirubin: 0.4 mg/dL (ref 0.2–1.2)
Total Protein: 6.8 g/dL (ref 6.0–8.3)

## 2024-03-16 LAB — LIPID PANEL
Cholesterol: 110 mg/dL (ref 0–200)
HDL: 36.9 mg/dL — ABNORMAL LOW (ref >39.00)
LDL Cholesterol: 48 mg/dL (ref 0–99)
NonHDL: 73.25
Total CHOL/HDL Ratio: 3
Triglycerides: 127 mg/dL (ref 0.0–149.0)
VLDL: 25.4 mg/dL (ref 0.0–40.0)

## 2024-03-16 LAB — CBC
HCT: 44.3 % (ref 39.0–52.0)
Hemoglobin: 14.7 g/dL (ref 13.0–17.0)
MCHC: 33.2 g/dL (ref 30.0–36.0)
MCV: 85 fl (ref 78.0–100.0)
Platelets: 218 K/uL (ref 150.0–400.0)
RBC: 5.22 Mil/uL (ref 4.22–5.81)
RDW: 13.6 % (ref 11.5–15.5)
WBC: 7.2 K/uL (ref 4.0–10.5)

## 2024-03-16 LAB — PSA: PSA: 0.5 ng/mL (ref 0.10–4.00)

## 2024-03-16 LAB — TSH: TSH: 0.99 u[IU]/mL (ref 0.35–5.50)

## 2024-03-16 LAB — HEMOGLOBIN A1C: Hgb A1c MFr Bld: 6.5 % (ref 4.6–6.5)

## 2024-03-23 ENCOUNTER — Ambulatory Visit: Payer: Medicare (Managed Care) | Admitting: Family Medicine

## 2024-04-09 ENCOUNTER — Other Ambulatory Visit: Payer: Self-pay | Admitting: Family Medicine

## 2024-04-09 DIAGNOSIS — F325 Major depressive disorder, single episode, in full remission: Secondary | ICD-10-CM

## 2024-04-12 ENCOUNTER — Other Ambulatory Visit (HOSPITAL_BASED_OUTPATIENT_CLINIC_OR_DEPARTMENT_OTHER): Payer: Self-pay

## 2024-04-12 ENCOUNTER — Other Ambulatory Visit: Payer: Self-pay | Admitting: Family Medicine

## 2024-04-12 MED ORDER — MOUNJARO 2.5 MG/0.5ML ~~LOC~~ SOAJ
2.5000 mg | SUBCUTANEOUS | 3 refills | Status: AC
Start: 2024-04-12 — End: ?
  Filled 2024-04-12: qty 2, 28d supply, fill #0
  Filled 2024-05-10: qty 2, 28d supply, fill #1
  Filled 2024-06-09: qty 2, 28d supply, fill #2

## 2024-04-19 ENCOUNTER — Other Ambulatory Visit: Payer: Self-pay | Admitting: Family Medicine

## 2024-04-19 DIAGNOSIS — E785 Hyperlipidemia, unspecified: Secondary | ICD-10-CM

## 2024-04-30 ENCOUNTER — Encounter: Payer: Self-pay | Admitting: Family Medicine

## 2024-05-02 ENCOUNTER — Other Ambulatory Visit: Payer: Self-pay | Admitting: Family Medicine

## 2024-05-02 DIAGNOSIS — I10 Essential (primary) hypertension: Secondary | ICD-10-CM

## 2024-05-26 ENCOUNTER — Other Ambulatory Visit: Payer: Self-pay | Admitting: Family Medicine

## 2024-05-26 DIAGNOSIS — K589 Irritable bowel syndrome without diarrhea: Secondary | ICD-10-CM

## 2024-07-06 ENCOUNTER — Encounter: Payer: Self-pay | Admitting: Family Medicine

## 2024-07-07 MED ORDER — TIRZEPATIDE 5 MG/0.5ML ~~LOC~~ SOAJ: 5.0000 mg | SUBCUTANEOUS | 1 refills | Status: DC

## 2024-07-07 NOTE — Addendum Note (Signed)
 Addended by: WATT RAISIN C on: 07/07/2024 09:57 AM   Modules accepted: Orders

## 2024-07-08 ENCOUNTER — Other Ambulatory Visit (HOSPITAL_BASED_OUTPATIENT_CLINIC_OR_DEPARTMENT_OTHER): Payer: Self-pay

## 2024-07-08 MED ORDER — TIRZEPATIDE 5 MG/0.5ML ~~LOC~~ SOAJ
5.0000 mg | SUBCUTANEOUS | 1 refills | Status: AC
  Filled 2024-07-08: qty 2, 28d supply, fill #0
  Filled 2024-08-06: qty 2, 28d supply, fill #1

## 2024-07-08 NOTE — Addendum Note (Signed)
 Addended by: WATT RAISIN C on: 07/08/2024 11:18 AM   Modules accepted: Orders

## 2024-07-11 ENCOUNTER — Encounter: Payer: Self-pay | Admitting: Pharmacist

## 2024-07-11 NOTE — Progress Notes (Signed)
" ° °  07/11/2024  Patient ID: Timothy Solis, male   DOB: [birth year], 58 y.o.   MRN: [id number]  Pharmacy Quality Measure Review  This patient is appearing on a report for being at risk of failing the adherence measure for diabetes medications this calendar year.   Medication: mounjaro  Last fill date: 05/10/2024 for 28 day supply per adherence report  Reviewed recent refill history in Dr Annemarie database. Actual last refill date was 07/08/2024 for 28 day supply and patient also filled the 2.5mg  dose for 28 day supply on 06/09/2024. Patient has 1 refill remaining. Next appointment with PCP is not yet scheduled.    Insurance report was not up to date. No action needed at this time.   Madelin Ray, PharmD Clinical Pharmacist [facility]   "

## 2024-07-18 ENCOUNTER — Other Ambulatory Visit: Payer: Self-pay | Admitting: Family Medicine

## 2024-07-18 DIAGNOSIS — E785 Hyperlipidemia, unspecified: Secondary | ICD-10-CM

## 2024-07-28 ENCOUNTER — Other Ambulatory Visit: Payer: Self-pay | Admitting: Family Medicine

## 2024-08-08 ENCOUNTER — Other Ambulatory Visit (HOSPITAL_BASED_OUTPATIENT_CLINIC_OR_DEPARTMENT_OTHER): Payer: Self-pay

## 2024-08-18 ENCOUNTER — Other Ambulatory Visit: Payer: Self-pay | Admitting: Family Medicine

## 2024-08-18 DIAGNOSIS — I10 Essential (primary) hypertension: Secondary | ICD-10-CM

## 2024-08-19 ENCOUNTER — Other Ambulatory Visit (HOSPITAL_BASED_OUTPATIENT_CLINIC_OR_DEPARTMENT_OTHER): Payer: Self-pay

## 2025-03-01 ENCOUNTER — Ambulatory Visit: Payer: Medicare (Managed Care)
